# Patient Record
Sex: Female | Born: 1975 | Race: Black or African American | Hispanic: No | Marital: Single | Smoking: Current every day smoker
Health system: Southern US, Community
[De-identification: ages and names within clinical notes are randomized; demographics above are authoritative.]

## PROBLEM LIST (undated history)

## (undated) DIAGNOSIS — K219 Gastro-esophageal reflux disease without esophagitis: Secondary | ICD-10-CM

## (undated) DIAGNOSIS — D649 Anemia, unspecified: Secondary | ICD-10-CM

## (undated) DIAGNOSIS — K269 Duodenal ulcer, unspecified as acute or chronic, without hemorrhage or perforation: Secondary | ICD-10-CM

## (undated) DIAGNOSIS — E669 Obesity, unspecified: Secondary | ICD-10-CM

## (undated) DIAGNOSIS — K209 Esophagitis, unspecified: Secondary | ICD-10-CM

## (undated) DIAGNOSIS — F329 Major depressive disorder, single episode, unspecified: Secondary | ICD-10-CM

## (undated) DIAGNOSIS — R109 Unspecified abdominal pain: Secondary | ICD-10-CM

## (undated) DIAGNOSIS — F32A Depression, unspecified: Secondary | ICD-10-CM

## (undated) DIAGNOSIS — I10 Essential (primary) hypertension: Secondary | ICD-10-CM

## (undated) DIAGNOSIS — F1910 Other psychoactive substance abuse, uncomplicated: Secondary | ICD-10-CM

## (undated) DIAGNOSIS — G8929 Other chronic pain: Secondary | ICD-10-CM

## (undated) HISTORY — PX: TUBAL LIGATION: SHX77

## (undated) HISTORY — PX: TONSILLECTOMY: SUR1361

---

## 1997-04-13 ENCOUNTER — Inpatient Hospital Stay (HOSPITAL_COMMUNITY): Admission: AD | Admit: 1997-04-13 | Discharge: 1997-04-13 | Payer: Self-pay | Admitting: *Deleted

## 1997-07-01 ENCOUNTER — Inpatient Hospital Stay (HOSPITAL_COMMUNITY): Admission: AD | Admit: 1997-07-01 | Discharge: 1997-07-01 | Payer: Self-pay | Admitting: *Deleted

## 1997-07-05 ENCOUNTER — Ambulatory Visit (HOSPITAL_COMMUNITY): Admission: RE | Admit: 1997-07-05 | Discharge: 1997-07-05 | Payer: Self-pay | Admitting: *Deleted

## 1997-07-21 ENCOUNTER — Inpatient Hospital Stay (HOSPITAL_COMMUNITY): Admission: AD | Admit: 1997-07-21 | Discharge: 1997-07-21 | Payer: Self-pay | Admitting: *Deleted

## 1997-08-11 ENCOUNTER — Inpatient Hospital Stay (HOSPITAL_COMMUNITY): Admission: AD | Admit: 1997-08-11 | Discharge: 1997-08-13 | Payer: Self-pay | Admitting: *Deleted

## 1998-02-21 ENCOUNTER — Emergency Department (HOSPITAL_COMMUNITY): Admission: EM | Admit: 1998-02-21 | Discharge: 1998-02-21 | Payer: Self-pay | Admitting: Emergency Medicine

## 1998-04-05 ENCOUNTER — Encounter: Payer: Self-pay | Admitting: Emergency Medicine

## 1998-04-05 ENCOUNTER — Emergency Department (HOSPITAL_COMMUNITY): Admission: EM | Admit: 1998-04-05 | Discharge: 1998-04-05 | Payer: Self-pay | Admitting: Emergency Medicine

## 1998-10-12 ENCOUNTER — Emergency Department (HOSPITAL_COMMUNITY): Admission: EM | Admit: 1998-10-12 | Discharge: 1998-10-12 | Payer: Self-pay | Admitting: *Deleted

## 1999-01-12 ENCOUNTER — Emergency Department (HOSPITAL_COMMUNITY): Admission: EM | Admit: 1999-01-12 | Discharge: 1999-01-12 | Payer: Self-pay | Admitting: Emergency Medicine

## 1999-04-03 ENCOUNTER — Emergency Department (HOSPITAL_COMMUNITY): Admission: EM | Admit: 1999-04-03 | Discharge: 1999-04-03 | Payer: Self-pay | Admitting: Internal Medicine

## 2002-03-11 HISTORY — PX: CHOLECYSTECTOMY: SHX55

## 2002-11-25 ENCOUNTER — Encounter: Payer: Self-pay | Admitting: Emergency Medicine

## 2002-11-25 ENCOUNTER — Inpatient Hospital Stay (HOSPITAL_COMMUNITY): Admission: EM | Admit: 2002-11-25 | Discharge: 2002-11-26 | Payer: Self-pay | Admitting: Emergency Medicine

## 2002-11-25 ENCOUNTER — Encounter: Payer: Self-pay | Admitting: Surgery

## 2002-11-25 ENCOUNTER — Encounter (INDEPENDENT_AMBULATORY_CARE_PROVIDER_SITE_OTHER): Payer: Self-pay | Admitting: Specialist

## 2003-01-27 ENCOUNTER — Inpatient Hospital Stay (HOSPITAL_COMMUNITY): Admission: AD | Admit: 2003-01-27 | Discharge: 2003-01-27 | Payer: Self-pay | Admitting: Obstetrics & Gynecology

## 2003-07-21 ENCOUNTER — Emergency Department (HOSPITAL_COMMUNITY): Admission: EM | Admit: 2003-07-21 | Discharge: 2003-07-21 | Payer: Self-pay | Admitting: Emergency Medicine

## 2004-03-14 ENCOUNTER — Emergency Department (HOSPITAL_COMMUNITY): Admission: EM | Admit: 2004-03-14 | Discharge: 2004-03-14 | Payer: Self-pay | Admitting: Emergency Medicine

## 2004-11-29 ENCOUNTER — Emergency Department (HOSPITAL_COMMUNITY): Admission: EM | Admit: 2004-11-29 | Discharge: 2004-11-29 | Payer: Self-pay | Admitting: *Deleted

## 2004-12-21 ENCOUNTER — Emergency Department (HOSPITAL_COMMUNITY): Admission: EM | Admit: 2004-12-21 | Discharge: 2004-12-21 | Payer: Self-pay | Admitting: Emergency Medicine

## 2006-03-11 DIAGNOSIS — G8929 Other chronic pain: Secondary | ICD-10-CM

## 2006-03-11 HISTORY — DX: Other chronic pain: G89.29

## 2006-05-02 ENCOUNTER — Emergency Department (HOSPITAL_COMMUNITY): Admission: EM | Admit: 2006-05-02 | Discharge: 2006-05-02 | Payer: Self-pay | Admitting: Emergency Medicine

## 2006-05-15 ENCOUNTER — Emergency Department (HOSPITAL_COMMUNITY): Admission: EM | Admit: 2006-05-15 | Discharge: 2006-05-15 | Payer: Self-pay | Admitting: Emergency Medicine

## 2006-06-04 ENCOUNTER — Emergency Department (HOSPITAL_COMMUNITY): Admission: EM | Admit: 2006-06-04 | Discharge: 2006-06-04 | Payer: Self-pay | Admitting: Emergency Medicine

## 2006-09-08 ENCOUNTER — Emergency Department (HOSPITAL_COMMUNITY): Admission: EM | Admit: 2006-09-08 | Discharge: 2006-09-08 | Payer: Self-pay | Admitting: Emergency Medicine

## 2006-09-21 ENCOUNTER — Emergency Department (HOSPITAL_COMMUNITY): Admission: EM | Admit: 2006-09-21 | Discharge: 2006-09-21 | Payer: Self-pay | Admitting: Emergency Medicine

## 2006-11-10 HISTORY — PX: LAPAROSCOPIC APPENDECTOMY: SUR753

## 2006-12-01 ENCOUNTER — Encounter (INDEPENDENT_AMBULATORY_CARE_PROVIDER_SITE_OTHER): Payer: Self-pay | Admitting: General Surgery

## 2006-12-01 ENCOUNTER — Inpatient Hospital Stay (HOSPITAL_COMMUNITY): Admission: EM | Admit: 2006-12-01 | Discharge: 2006-12-03 | Payer: Self-pay | Admitting: Emergency Medicine

## 2007-04-18 ENCOUNTER — Emergency Department (HOSPITAL_COMMUNITY): Admission: EM | Admit: 2007-04-18 | Discharge: 2007-04-18 | Payer: Self-pay | Admitting: Emergency Medicine

## 2007-05-24 ENCOUNTER — Emergency Department (HOSPITAL_COMMUNITY): Admission: EM | Admit: 2007-05-24 | Discharge: 2007-05-25 | Payer: Self-pay | Admitting: Emergency Medicine

## 2007-07-24 ENCOUNTER — Emergency Department (HOSPITAL_COMMUNITY): Admission: EM | Admit: 2007-07-24 | Discharge: 2007-07-24 | Payer: Self-pay | Admitting: Emergency Medicine

## 2007-09-16 ENCOUNTER — Emergency Department (HOSPITAL_COMMUNITY): Admission: EM | Admit: 2007-09-16 | Discharge: 2007-09-16 | Payer: Self-pay | Admitting: Emergency Medicine

## 2007-10-23 ENCOUNTER — Emergency Department (HOSPITAL_COMMUNITY): Admission: EM | Admit: 2007-10-23 | Discharge: 2007-10-23 | Payer: Self-pay | Admitting: Emergency Medicine

## 2007-11-15 ENCOUNTER — Inpatient Hospital Stay (HOSPITAL_COMMUNITY): Admission: AD | Admit: 2007-11-15 | Discharge: 2007-11-15 | Payer: Self-pay | Admitting: Obstetrics & Gynecology

## 2007-12-06 ENCOUNTER — Emergency Department (HOSPITAL_COMMUNITY): Admission: EM | Admit: 2007-12-06 | Discharge: 2007-12-06 | Payer: Self-pay | Admitting: Emergency Medicine

## 2007-12-09 ENCOUNTER — Emergency Department (HOSPITAL_COMMUNITY): Admission: EM | Admit: 2007-12-09 | Discharge: 2007-12-09 | Payer: Self-pay | Admitting: Emergency Medicine

## 2008-02-09 ENCOUNTER — Emergency Department (HOSPITAL_COMMUNITY): Admission: EM | Admit: 2008-02-09 | Discharge: 2008-02-09 | Payer: Self-pay | Admitting: Emergency Medicine

## 2008-03-21 ENCOUNTER — Emergency Department (HOSPITAL_COMMUNITY): Admission: EM | Admit: 2008-03-21 | Discharge: 2008-03-21 | Payer: Self-pay | Admitting: Emergency Medicine

## 2008-04-22 ENCOUNTER — Emergency Department (HOSPITAL_COMMUNITY): Admission: EM | Admit: 2008-04-22 | Discharge: 2008-04-22 | Payer: Self-pay | Admitting: Emergency Medicine

## 2008-07-07 ENCOUNTER — Emergency Department (HOSPITAL_COMMUNITY): Admission: EM | Admit: 2008-07-07 | Discharge: 2008-07-07 | Payer: Self-pay | Admitting: Emergency Medicine

## 2008-09-22 ENCOUNTER — Emergency Department (HOSPITAL_COMMUNITY): Admission: EM | Admit: 2008-09-22 | Discharge: 2008-09-23 | Payer: Self-pay | Admitting: Emergency Medicine

## 2009-02-03 ENCOUNTER — Emergency Department (HOSPITAL_COMMUNITY): Admission: EM | Admit: 2009-02-03 | Discharge: 2009-02-04 | Payer: Self-pay | Admitting: Emergency Medicine

## 2009-02-16 ENCOUNTER — Emergency Department (HOSPITAL_COMMUNITY): Admission: EM | Admit: 2009-02-16 | Discharge: 2009-02-16 | Payer: Self-pay | Admitting: Emergency Medicine

## 2009-03-11 DIAGNOSIS — K209 Esophagitis, unspecified without bleeding: Secondary | ICD-10-CM

## 2009-03-11 DIAGNOSIS — E669 Obesity, unspecified: Secondary | ICD-10-CM

## 2009-03-11 HISTORY — DX: Obesity, unspecified: E66.9

## 2009-03-11 HISTORY — DX: Esophagitis, unspecified without bleeding: K20.90

## 2009-03-17 ENCOUNTER — Emergency Department (HOSPITAL_COMMUNITY): Admission: EM | Admit: 2009-03-17 | Discharge: 2009-03-17 | Payer: Self-pay | Admitting: Emergency Medicine

## 2009-03-17 ENCOUNTER — Encounter (INDEPENDENT_AMBULATORY_CARE_PROVIDER_SITE_OTHER): Payer: Self-pay | Admitting: *Deleted

## 2009-03-24 ENCOUNTER — Telehealth: Payer: Self-pay | Admitting: Gastroenterology

## 2009-08-07 ENCOUNTER — Emergency Department (HOSPITAL_COMMUNITY): Admission: EM | Admit: 2009-08-07 | Discharge: 2009-08-07 | Payer: Self-pay | Admitting: Emergency Medicine

## 2009-10-11 ENCOUNTER — Emergency Department (HOSPITAL_COMMUNITY): Admission: EM | Admit: 2009-10-11 | Discharge: 2009-10-11 | Payer: Self-pay | Admitting: Emergency Medicine

## 2009-11-09 ENCOUNTER — Emergency Department (HOSPITAL_COMMUNITY): Admission: EM | Admit: 2009-11-09 | Discharge: 2009-11-09 | Payer: Self-pay | Admitting: Emergency Medicine

## 2009-12-29 ENCOUNTER — Emergency Department (HOSPITAL_COMMUNITY): Admission: EM | Admit: 2009-12-29 | Discharge: 2009-12-29 | Payer: Self-pay | Admitting: Emergency Medicine

## 2009-12-30 ENCOUNTER — Emergency Department (HOSPITAL_COMMUNITY): Admission: EM | Admit: 2009-12-30 | Discharge: 2009-12-30 | Payer: Self-pay | Admitting: Emergency Medicine

## 2010-02-15 ENCOUNTER — Inpatient Hospital Stay (HOSPITAL_COMMUNITY): Admission: EM | Admit: 2010-02-15 | Discharge: 2009-03-27 | Payer: Self-pay | Admitting: Emergency Medicine

## 2010-04-10 NOTE — Progress Notes (Signed)
Summary: triage  Phone Note Call from Patient Call back at [phone]   Caller: Patient Call For: Dr. Christella Hartigan Reason for Call: Talk to Nurse Summary of Call: pt reporting abd pain is worsening and would like to be seen before sch'ed appt on the 28th Initial call taken by: Vallarie Mare,  March 24, 2009 10:59 AM  Follow-up for Phone Call        [phone]  this number was given to me by the pts mother. The number that was left for me to call is hers.  I called the number above given to me by her mother but got voicemail.  left message on machine to call back Chales Abrahams CMA (AAMA)  March 24, 2009 11:19 AM   Pt called and states she has been vomiting since she left the ER and has chronic abd pain and is getting weak.  I offered appt with Amy Esterwood on Monday and advised her to go to the ER.  She agreed to the appt and is going to the ER and will keep appt with Amy on Monday.  Appt with Dr Christella Hartigan was cx. Follow-up by: Chales Abrahams CMA Duncan Dull),  March 24, 2009 11:43 AM

## 2010-04-10 NOTE — Letter (Signed)
Summary: New Patient letter  [facility]  [address] [place], [place] [address]   Phone: [phone]  Fax: [phone]       03/17/2009 MRN: [id number]  [facility] [address] [place], [place]  [address]  Dear [name],  Welcome to the Gastroenterology Division at [facility]    You are scheduled to see Dr.  Rob Bunting on April 07, 2009 at 3:00pm on the 3rd floor at [facility], [address]  We ask that you try to arrive at our office 15 minutes prior to your appointment time to allow for check-in.  We would like you to complete the enclosed self-administered evaluation form prior to your visit and bring it with you on the day of your appointment.  We will review it with you.  Also, please bring a complete list of all your medications or, if you prefer, bring the medication bottles and we will list them.  Please bring your insurance card so that we may make a copy of it.  If your insurance requires a referral to see a specialist, please bring your referral form from your primary care physician.  Co-payments are due at the time of your visit and may be paid by cash, check or credit card.     Your office visit will consist of a consult with your physician (includes a physical exam), any laboratory testing he/she may order, scheduling of any necessary diagnostic testing (e.g. x-ray, ultrasound, CT-scan), and scheduling of a procedure (e.g. Endoscopy, Colonoscopy) if required.  Please allow enough time on your schedule to allow for any/all of these possibilities.    If you cannot keep your appointment, please call [phone] to cancel or reschedule prior to your appointment date.  This allows [place] the opportunity to schedule an appointment for another patient in need of care.  If you do not cancel or reschedule by 5 p.m. the business day prior to your appointment date, you will be charged a $50.00 late cancellation/no-show fee.     Thank you for choosing [facility] for your medical needs.  We appreciate the opportunity to care for you.  Please visit [place] at our website  to learn more about our practice.                     Sincerely,                                                             The Gastroenterology Division

## 2010-05-23 LAB — URINALYSIS, ROUTINE W REFLEX MICROSCOPIC
Bilirubin Urine: NEGATIVE
Ketones, ur: NEGATIVE mg/dL
Nitrite: NEGATIVE
Specific Gravity, Urine: 1.007 (ref 1.005–1.030)
Urobilinogen, UA: 0.2 mg/dL (ref 0.0–1.0)
pH: 7.5 (ref 5.0–8.0)

## 2010-05-23 LAB — CBC
HCT: 38.3 % (ref 36.0–46.0)
Hemoglobin: 12.8 g/dL (ref 12.0–15.0)
Hemoglobin: 12.9 g/dL (ref 12.0–15.0)
MCH: 31.3 pg (ref 26.0–34.0)
MCH: 31.4 pg (ref 26.0–34.0)
MCHC: 33.6 g/dL (ref 30.0–36.0)
MCHC: 34 g/dL (ref 30.0–36.0)
MCV: 93.1 fL (ref 78.0–100.0)
Platelets: 394 10*3/uL (ref 150–400)
RBC: 4.08 MIL/uL (ref 3.87–5.11)
RBC: 4.12 MIL/uL (ref 3.87–5.11)

## 2010-05-23 LAB — COMPREHENSIVE METABOLIC PANEL
ALT: 20 U/L (ref 0–35)
AST: 23 U/L (ref 0–37)
Albumin: 3.7 g/dL (ref 3.5–5.2)
BUN: 8 mg/dL (ref 6–23)
CO2: 25 mEq/L (ref 19–32)
CO2: 27 mEq/L (ref 19–32)
Calcium: 8.9 mg/dL (ref 8.4–10.5)
Calcium: 9.2 mg/dL (ref 8.4–10.5)
Chloride: 106 mEq/L (ref 96–112)
Creatinine, Ser: 0.63 mg/dL (ref 0.4–1.2)
Creatinine, Ser: 0.71 mg/dL (ref 0.4–1.2)
GFR calc Af Amer: >60 mL/min (ref >60)
GFR calc non Af Amer: >60 mL/min (ref >60)
Glucose, Bld: 89 mg/dL (ref 70–99)
Sodium: 136 mEq/L (ref 135–145)
Total Bilirubin: 0.5 mg/dL (ref 0.3–1.2)

## 2010-05-23 LAB — DIFFERENTIAL
Basophils Absolute: 0.1 10*3/uL (ref 0.0–0.1)
Eosinophils Absolute: 0.1 10*3/uL (ref 0.0–0.7)
Eosinophils Relative: 1 % (ref 0–5)
Lymphocytes Relative: 18 % (ref 12–46)
Lymphocytes Relative: 20 % (ref 12–46)
Lymphs Abs: 2 10*3/uL (ref 0.7–4.0)
Lymphs Abs: 2 10*3/uL (ref 0.7–4.0)
Monocytes Absolute: 0.2 10*3/uL (ref 0.1–1.0)
Monocytes Relative: 2 % — ABNORMAL LOW (ref 3–12)
Neutrophils Relative %: 75 % (ref 43–77)

## 2010-05-23 LAB — LIPASE, BLOOD
Lipase: 22 U/L (ref 11–59)
Lipase: 26 U/L (ref 11–59)

## 2010-05-24 LAB — POCT I-STAT, CHEM 8
BUN: 14 mg/dL (ref 6–23)
Calcium, Ion: 1.19 mmol/L (ref 1.12–1.32)
Chloride: 101 mEq/L (ref 96–112)
Creatinine, Ser: 0.6 mg/dL (ref 0.4–1.2)
Sodium: 137 mEq/L (ref 135–145)

## 2010-05-24 LAB — POCT PREGNANCY, URINE: Preg Test, Ur: NEGATIVE

## 2010-05-24 LAB — CBC
Hemoglobin: 13 g/dL (ref 12.0–15.0)
MCV: 90.2 fL (ref 78.0–100.0)
Platelets: 364 10*3/uL (ref 150–400)
RBC: 4.09 MIL/uL (ref 3.87–5.11)
WBC: 13.5 10*3/uL — ABNORMAL HIGH (ref 4.0–10.5)

## 2010-05-24 LAB — MONONUCLEOSIS SCREEN: Mono Screen: NEGATIVE

## 2010-05-24 LAB — URINALYSIS, ROUTINE W REFLEX MICROSCOPIC
Bilirubin Urine: NEGATIVE
Glucose, UA: NEGATIVE mg/dL
Hgb urine dipstick: NEGATIVE
Specific Gravity, Urine: 1.016 (ref 1.005–1.030)
Urobilinogen, UA: 1 mg/dL (ref 0.0–1.0)
pH: 6 (ref 5.0–8.0)

## 2010-05-24 LAB — DIFFERENTIAL
Eosinophils Relative: 1 % (ref 0–5)
Lymphocytes Relative: 25 % (ref 12–46)
Lymphs Abs: 3.4 10*3/uL (ref 0.7–4.0)
Monocytes Relative: 6 % (ref 3–12)

## 2010-05-24 LAB — POCT CARDIAC MARKERS
CKMB, poc: 1.3 ng/mL (ref 1.0–8.0)
Troponin i, poc: <0.05 ng/mL (ref 0.00–0.09)

## 2010-05-25 LAB — RAPID URINE DRUG SCREEN, HOSP PERFORMED
Amphetamines: POSITIVE — AB
Barbiturates: NOT DETECTED
Benzodiazepines: NOT DETECTED
Cocaine: NOT DETECTED
Opiates: NOT DETECTED
Tetrahydrocannabinol: NOT DETECTED

## 2010-05-27 LAB — DIFFERENTIAL
Basophils Absolute: 0.1 10*3/uL (ref 0.0–0.1)
Basophils Absolute: 0.1 10*3/uL (ref 0.0–0.1)
Basophils Relative: 1 % (ref 0–1)
Eosinophils Absolute: 0 10*3/uL (ref 0.0–0.7)
Lymphocytes Relative: 27 % (ref 12–46)
Lymphs Abs: 3.6 10*3/uL (ref 0.7–4.0)
Monocytes Absolute: 0.5 10*3/uL (ref 0.1–1.0)
Monocytes Relative: 5 % (ref 3–12)
Neutro Abs: 7.7 10*3/uL (ref 1.7–7.7)
Neutro Abs: 8.7 10*3/uL — ABNORMAL HIGH (ref 1.7–7.7)
Neutrophils Relative %: 66 % (ref 43–77)
Neutrophils Relative %: 70 % (ref 43–77)

## 2010-05-27 LAB — URINALYSIS, ROUTINE W REFLEX MICROSCOPIC
Glucose, UA: NEGATIVE mg/dL
Nitrite: NEGATIVE
Protein, ur: 30 mg/dL — AB
Specific Gravity, Urine: 1.04 — ABNORMAL HIGH (ref 1.005–1.030)
Urobilinogen, UA: 1 mg/dL (ref 0.0–1.0)
pH: 6.5 (ref 5.0–8.0)
pH: 7 (ref 5.0–8.0)

## 2010-05-27 LAB — COMPREHENSIVE METABOLIC PANEL
ALT: 29 U/L (ref 0–35)
AST: 22 U/L (ref 0–37)
Alkaline Phosphatase: 62 U/L (ref 39–117)
BUN: 7 mg/dL (ref 6–23)
BUN: 9 mg/dL (ref 6–23)
CO2: 23 mEq/L (ref 19–32)
Calcium: 8.9 mg/dL (ref 8.4–10.5)
Chloride: 103 mEq/L (ref 96–112)
Creatinine, Ser: 0.61 mg/dL (ref 0.4–1.2)
GFR calc Af Amer: >60 mL/min (ref >60)
GFR calc non Af Amer: >60 mL/min (ref >60)
Glucose, Bld: 93 mg/dL (ref 70–99)
Potassium: 4.1 mEq/L (ref 3.5–5.1)
Sodium: 137 mEq/L (ref 135–145)
Total Bilirubin: 0.5 mg/dL (ref 0.3–1.2)
Total Bilirubin: 0.6 mg/dL (ref 0.3–1.2)
Total Protein: 8 g/dL (ref 6.0–8.3)

## 2010-05-27 LAB — CBC
HCT: 34.7 % — ABNORMAL LOW (ref 36.0–46.0)
HCT: 37.7 % (ref 36.0–46.0)
HCT: 41 % (ref 36.0–46.0)
Hemoglobin: 11.4 g/dL — ABNORMAL LOW (ref 12.0–15.0)
Hemoglobin: 13.7 g/dL (ref 12.0–15.0)
MCHC: 33.4 g/dL (ref 30.0–36.0)
MCV: 93.6 fL (ref 78.0–100.0)
MCV: 94.1 fL (ref 78.0–100.0)
Platelets: 334 10*3/uL (ref 150–400)
RBC: 3.68 MIL/uL — ABNORMAL LOW (ref 3.87–5.11)
RBC: 4.03 MIL/uL (ref 3.87–5.11)
RBC: 4.39 MIL/uL (ref 3.87–5.11)
WBC: 10.4 10*3/uL (ref 4.0–10.5)
WBC: 11.1 10*3/uL — ABNORMAL HIGH (ref 4.0–10.5)

## 2010-05-27 LAB — BASIC METABOLIC PANEL
Chloride: 104 mEq/L (ref 96–112)
GFR calc Af Amer: >60 mL/min (ref >60)
GFR calc non Af Amer: >60 mL/min (ref >60)
Potassium: 3.3 mEq/L — ABNORMAL LOW (ref 3.5–5.1)
Sodium: 137 mEq/L (ref 135–145)

## 2010-05-27 LAB — LIPASE, BLOOD: Lipase: 20 U/L (ref 11–59)

## 2010-05-27 LAB — URINE CULTURE

## 2010-05-27 LAB — RAPID URINE DRUG SCREEN, HOSP PERFORMED
Amphetamines: NOT DETECTED
Tetrahydrocannabinol: NOT DETECTED

## 2010-05-27 LAB — URINE MICROSCOPIC-ADD ON

## 2010-05-27 LAB — PREGNANCY, URINE: Preg Test, Ur: NEGATIVE

## 2010-05-28 LAB — CBC
HCT: 36.4 % (ref 36.0–46.0)
MCV: 94.3 fL (ref 78.0–100.0)
Platelets: 353 10*3/uL (ref 150–400)
RDW: 14.4 % (ref 11.5–15.5)

## 2010-05-28 LAB — DIFFERENTIAL
Lymphocytes Relative: 30 % (ref 12–46)
Monocytes Absolute: 0.4 10*3/uL (ref 0.1–1.0)
Monocytes Relative: 4 % (ref 3–12)
Neutro Abs: 6 10*3/uL (ref 1.7–7.7)

## 2010-05-28 LAB — URINALYSIS, ROUTINE W REFLEX MICROSCOPIC
Ketones, ur: NEGATIVE mg/dL
Nitrite: NEGATIVE
pH: 6.5 (ref 5.0–8.0)

## 2010-05-28 LAB — URINE CULTURE: Culture: NO GROWTH

## 2010-05-28 LAB — COMPREHENSIVE METABOLIC PANEL
Albumin: 3.8 g/dL (ref 3.5–5.2)
BUN: 6 mg/dL (ref 6–23)
Creatinine, Ser: 0.59 mg/dL (ref 0.4–1.2)
GFR calc Af Amer: >60 mL/min (ref >60)
Potassium: 3.8 mEq/L (ref 3.5–5.1)
Total Protein: 7.1 g/dL (ref 6.0–8.3)

## 2010-05-28 LAB — LIPASE, BLOOD: Lipase: 28 U/L (ref 11–59)

## 2010-05-28 LAB — MAGNESIUM: Magnesium: 1.9 mg/dL (ref 1.5–2.5)

## 2010-05-28 LAB — URINE MICROSCOPIC-ADD ON

## 2010-05-28 LAB — POCT CARDIAC MARKERS: Myoglobin, poc: 36.6 ng/mL (ref 12–200)

## 2010-06-12 LAB — URINE MICROSCOPIC-ADD ON

## 2010-06-12 LAB — URINALYSIS, ROUTINE W REFLEX MICROSCOPIC
Glucose, UA: NEGATIVE mg/dL
pH: 6.5 (ref 5.0–8.0)

## 2010-06-12 LAB — WET PREP, GENITAL

## 2010-06-12 LAB — GC/CHLAMYDIA PROBE AMP, GENITAL: Chlamydia, DNA Probe: NEGATIVE

## 2010-06-13 LAB — COMPREHENSIVE METABOLIC PANEL
ALT: 32 U/L (ref 0–35)
Albumin: 3.9 g/dL (ref 3.5–5.2)
Alkaline Phosphatase: 65 U/L (ref 39–117)
BUN: 7 mg/dL (ref 6–23)
Chloride: 102 mEq/L (ref 96–112)
Potassium: 3.5 mEq/L (ref 3.5–5.1)
Total Bilirubin: 0.6 mg/dL (ref 0.3–1.2)

## 2010-06-13 LAB — URINALYSIS, ROUTINE W REFLEX MICROSCOPIC
Nitrite: NEGATIVE
Specific Gravity, Urine: 1.035 — ABNORMAL HIGH (ref 1.005–1.030)
pH: 6.5 (ref 5.0–8.0)

## 2010-06-13 LAB — CBC
HCT: 37.5 % (ref 36.0–46.0)
Hemoglobin: 12.5 g/dL (ref 12.0–15.0)
Platelets: 400 10*3/uL (ref 150–400)
WBC: 11.3 10*3/uL — ABNORMAL HIGH (ref 4.0–10.5)

## 2010-06-13 LAB — DIFFERENTIAL
Basophils Absolute: 0.2 10*3/uL — ABNORMAL HIGH (ref 0.0–0.1)
Basophils Relative: 2 % — ABNORMAL HIGH (ref 0–1)
Eosinophils Absolute: 0.1 10*3/uL (ref 0.0–0.7)
Eosinophils Relative: 1 % (ref 0–5)
Monocytes Absolute: 0.4 10*3/uL (ref 0.1–1.0)
Neutro Abs: 7.1 10*3/uL (ref 1.7–7.7)

## 2010-06-13 LAB — URINE MICROSCOPIC-ADD ON

## 2010-06-13 LAB — URINE CULTURE

## 2010-06-18 LAB — BASIC METABOLIC PANEL
BUN: 7 mg/dL (ref 6–23)
CO2: 25 mEq/L (ref 19–32)
Calcium: 8.7 mg/dL (ref 8.4–10.5)
Creatinine, Ser: 0.57 mg/dL (ref 0.4–1.2)
GFR calc Af Amer: >60 mL/min (ref >60)
Glucose, Bld: 78 mg/dL (ref 70–99)

## 2010-06-18 LAB — DIFFERENTIAL
Basophils Absolute: 0.5 10*3/uL — ABNORMAL HIGH (ref 0.0–0.1)
Eosinophils Absolute: 0.1 10*3/uL (ref 0.0–0.7)
Lymphocytes Relative: 21 % (ref 12–46)
Monocytes Absolute: 0.3 10*3/uL (ref 0.1–1.0)
Neutrophils Relative %: 72 % (ref 43–77)

## 2010-06-18 LAB — CBC
MCHC: 34.3 g/dL (ref 30.0–36.0)
MCV: 93.5 fL (ref 78.0–100.0)
RDW: 13.8 % (ref 11.5–15.5)

## 2010-06-26 LAB — POCT CARDIAC MARKERS
CKMB, poc: <1 ng/mL — ABNORMAL LOW (ref 1.0–8.0)
CKMB, poc: <1 ng/mL — ABNORMAL LOW (ref 1.0–8.0)
Myoglobin, poc: 39.5 ng/mL (ref 12–200)
Myoglobin, poc: 43.4 ng/mL (ref 12–200)

## 2010-06-26 LAB — DIFFERENTIAL
Basophils Relative: 1 % (ref 0–1)
Eosinophils Absolute: 0.1 10*3/uL (ref 0.0–0.7)
Eosinophils Relative: 1 % (ref 0–5)
Lymphs Abs: 2.4 10*3/uL (ref 0.7–4.0)
Neutrophils Relative %: 72 % (ref 43–77)

## 2010-06-26 LAB — POCT I-STAT, CHEM 8
BUN: 5 mg/dL — ABNORMAL LOW (ref 6–23)
Creatinine, Ser: 0.5 mg/dL (ref 0.4–1.2)
Glucose, Bld: 89 mg/dL (ref 70–99)
Hemoglobin: 12.9 g/dL (ref 12.0–15.0)
Potassium: 3.9 mEq/L (ref 3.5–5.1)
Sodium: 139 mEq/L (ref 135–145)

## 2010-06-26 LAB — URINE CULTURE: Colony Count: 40000

## 2010-06-26 LAB — CBC
HCT: 36 % (ref 36.0–46.0)
Hemoglobin: 12 g/dL (ref 12.0–15.0)
MCHC: 33.5 g/dL (ref 30.0–36.0)
MCV: 93.4 fL (ref 78.0–100.0)
Platelets: 378 10*3/uL (ref 150–400)
RBC: 3.85 MIL/uL — ABNORMAL LOW (ref 3.87–5.11)
RDW: 14.2 % (ref 11.5–15.5)
WBC: 11.2 10*3/uL — ABNORMAL HIGH (ref 4.0–10.5)

## 2010-06-26 LAB — D-DIMER, QUANTITATIVE: D-Dimer, Quant: 0.39 ug/mL-FEU (ref 0.00–0.48)

## 2010-06-26 LAB — URINALYSIS, ROUTINE W REFLEX MICROSCOPIC
Bilirubin Urine: NEGATIVE
Glucose, UA: NEGATIVE mg/dL
Ketones, ur: NEGATIVE mg/dL
Nitrite: NEGATIVE
Protein, ur: NEGATIVE mg/dL
pH: 6.5 (ref 5.0–8.0)

## 2010-06-26 LAB — POCT PREGNANCY, URINE: Preg Test, Ur: NEGATIVE

## 2010-07-05 ENCOUNTER — Emergency Department (HOSPITAL_COMMUNITY)
Admission: EM | Admit: 2010-07-05 | Discharge: 2010-07-05 | Disposition: A | Discharge status: Home / Self Care | Payer: Self-pay | Attending: Emergency Medicine | Admitting: Emergency Medicine

## 2010-07-05 ENCOUNTER — Emergency Department (HOSPITAL_COMMUNITY): Payer: Self-pay

## 2010-07-05 DIAGNOSIS — K589 Irritable bowel syndrome without diarrhea: Secondary | ICD-10-CM | POA: Insufficient documentation

## 2010-07-05 DIAGNOSIS — R109 Unspecified abdominal pain: Secondary | ICD-10-CM | POA: Insufficient documentation

## 2010-07-05 DIAGNOSIS — R112 Nausea with vomiting, unspecified: Secondary | ICD-10-CM | POA: Insufficient documentation

## 2010-07-05 DIAGNOSIS — K219 Gastro-esophageal reflux disease without esophagitis: Secondary | ICD-10-CM | POA: Insufficient documentation

## 2010-07-05 DIAGNOSIS — I10 Essential (primary) hypertension: Secondary | ICD-10-CM | POA: Insufficient documentation

## 2010-07-05 LAB — DIFFERENTIAL
Basophils Absolute: 0 10*3/uL (ref 0.0–0.1)
Basophils Relative: 0 % (ref 0–1)
Eosinophils Absolute: 0.1 10*3/uL (ref 0.0–0.7)
Eosinophils Relative: 1 % (ref 0–5)
Monocytes Absolute: 1 10*3/uL (ref 0.1–1.0)
Monocytes Relative: 7 % (ref 3–12)

## 2010-07-05 LAB — COMPREHENSIVE METABOLIC PANEL
AST: 18 U/L (ref 0–37)
Albumin: 4.2 g/dL (ref 3.5–5.2)
CO2: 25 mEq/L (ref 19–32)
Calcium: 9.6 mg/dL (ref 8.4–10.5)
Creatinine, Ser: 0.79 mg/dL (ref 0.4–1.2)
GFR calc Af Amer: >60 mL/min (ref >60)
GFR calc non Af Amer: >60 mL/min (ref >60)
Sodium: 136 mEq/L (ref 135–145)
Total Protein: 8.1 g/dL (ref 6.0–8.3)

## 2010-07-05 LAB — URINALYSIS, ROUTINE W REFLEX MICROSCOPIC
Glucose, UA: NEGATIVE mg/dL
Protein, ur: NEGATIVE mg/dL
Specific Gravity, Urine: 1.013 (ref 1.005–1.030)
Urobilinogen, UA: 0.2 mg/dL (ref 0.0–1.0)

## 2010-07-05 LAB — URINE MICROSCOPIC-ADD ON

## 2010-07-05 LAB — CBC
MCH: 30.5 pg (ref 26.0–34.0)
MCHC: 34.4 g/dL (ref 30.0–36.0)
Platelets: 400 10*3/uL (ref 150–400)
RDW: 13.8 % (ref 11.5–15.5)

## 2010-07-24 NOTE — H&P (Signed)
[name], [name]             ACCOUNT NO.:  [id number]   MEDICAL RECORD NO.:  [id number]          PATIENT TYPE:  INP   LOCATION:  1610                         FACILITY:  [facility]   PHYSICIAN:  Adolph Pollack, M.D.DATE OF BIRTH:  [birth year]   DATE OF ADMISSION:  11/30/2006  DATE OF DISCHARGE:                              HISTORY & PHYSICAL   CHIEF COMPLAINT:  Lower abdominal pain.   HISTORY OF PRESENT ILLNESS:  This is a 35 year old female who got off  work yesterday and then began complaining of severe lower abdominal  pain.  Pain radiated up to her chest.  The pain was associated with  nausea and vomiting.  No fever and no diarrhea.  The pain persisted, and  she came to the emergency room at approximately 7:15 p.m. on November 30, 2006 for evaluation.  She is noted to have right lower quadrant  tenderness.  White cell count was elevated to 22,200 with a left shift,  and urinalysis was unremarkable.  She has had a previous  cholecystectomy.  She underwent a CT scan of the abdomen which  demonstrated mild dilatation of the appendix with some inflammatory  changes and a small amount of free fluid consistent with early acute  appendicitis.  It was at this point that I was asked to see her.   PAST MEDICAL HISTORY:  1. Hypertension.  2. Asthma.  3. Pyelonephritis  4. Gallbladder disease.   PAST SURGICAL HISTORY:  1. Tonsillectomy.  2. Laparoscopic tubal ligation.  3. Cesarean section.  4. Laparoscopic cholecystectomy.   ALLERGIES:  HYDROCODONE.   MEDICATIONS:  Albuterol inhaler p.r.n.   SOCIAL HISTORY:  She smokes cigarettes.  No alcohol use.  She works for  [employer].  She has no primary care physician.   FAMILY HISTORY:  Positive for hypertension.   REVIEW OF SYSTEMS:  CARDIOVASCULAR:  She denies any heart disease or  heart attack.  PULMONARY:  She denies pneumonia or tuberculosis.  GI:  She denies peptic ulcer disease, hepatitis, colitis or diverticulitis.  GU:  She denies kidney stones.  ENDOCRINE:  She denies diabetes, thyroid  disease or hypercholesterolemia.  NEUROLOGIC:  No strokes or seizures.  HEMATOLOGIC:  She denies any bleeding disorders, blood clots or  transfusions.   PHYSICAL EXAMINATION:  GENERAL:  An overweight ill-appearing female.  She is having some vomiting intermittently.  VITAL SIGNS:  Temperature is 97.6, blood pressure is 133/85, pulse 99.  HEENT:  Extraocular motions intact.  No icterus.  NECK:  Supple without obvious masses.  RESPIRATORY:  Breath sounds are equal and clear.  Respirations  unlabored.  CARDIOVASCULAR:  Heart demonstrates a regular rate with a regular  rhythm.  I had do not appreciate a murmur.  No lower extremity edema.  ABDOMEN:  Soft and obese.  There is a small subumbilical scar with a  little bit of supraumbilical fullness.  There is tenderness to palpation  and percussion in the right lower quadrant region.  No obvious mass.  There is a lower transverse incision and smaller upper abdominal  incisions present.  MUSCULOSKELETAL:  Good muscle tone and  range of motion.   LABORATORY DATA:  Remarkable for white blood cell count of 22,200.  UA  negative.  Electrolytes within normal limits.   CT scan was reviewed.  It demonstrates some mildly dilated appendix with  some inflammatory changes and free fluid.  What appears to be a small  umbilical hernia is noted.  Prominent uterus consistent with adenomyosis  is noted.   IMPRESSION:  Early acute appendicitis.   PLAN:  Laparoscopy with appendectomy.  I have discussed the procedure  rationale and risks with the patient.  The risks include but are not  limited to bleeding, infection, wound healing problems, anesthesia,  accidental damage to intra-abdominal organs, and possibly finding other  pathology.  She seems understand to this and agrees to proceed.  We will  start her on intravenous antibiotics as well.      Adolph Pollack, M.D.   Electronically Signed     TJR/MEDQ  D:  12/01/2006  T:  12/01/2006  Job:  [id number]

## 2010-07-24 NOTE — Op Note (Signed)
NAMECRYSTOL, WALPOLE             ACCOUNT NO.:  [id number]   MEDICAL RECORD NO.:  [id number]          PATIENT TYPE:  INP   LOCATION:  1610                         FACILITY:  [facility]   PHYSICIAN:  Adolph Pollack, M.D.DATE OF BIRTH:  Aug 08, 1975   DATE OF PROCEDURE:  12/01/2006  DATE OF DISCHARGE:                               OPERATIVE REPORT   PREOPERATIVE DIAGNOSIS:  Early acute appendicitis.   POSTOPERATIVE DIAGNOSIS:  Early acute appendicitis.   PROCEDURE:  Laparoscopic appendectomy.   SURGEON:  Adolph Pollack, M.D.   ANESTHESIA:  General.   INDICATIONS:  35 year old female presented to the emergency room with  severe lower abdominal pain and was evaluated, had a white cell count  22,000.  CT scan was consistent with a dilated appendix and some  inflammatory change around appendix consistent with early acute  appendicitis.  She is now brought to the operating room.   TECHNIQUE:  She is brought to the operating room, placed supine on the  operating table.  General anesthetic was administered.  A Foley catheter  placed in bladder.  The abdominal wall was sterilely prepped and draped.  Marcaine was infiltrated at the site of her previous small subumbilical  incision.  This incision was reincised sharply.  Incision carried down  to the fascia.  A small incision was made in the fascia and the  peritoneal cavity was entered under direct vision.  A pursestring suture  of 0 Vicryl was placed around the fascial edges.  A Hasson trocar was  introduced in the peritoneal cavity.  Pneumoperitoneum created by  insufflation of CO2 gas.   Next the laparoscope was introduced.  I placed a 5 mm trocar in the  right upper quadrant and one in the left lower quadrant.  The appendix  appeared to be mildly inflamed but not ruptured.  There is a small  amount of straw-colored pelvic fluid.   The mesoappendix was grasped and retracted near the tip of the appendix  and the appendix was  retracted anteriorly.  I divided the mesoappendix  down the base using harmonic scalpel.  I then amputated the appendix off  the cecum with the Endo-GIA stapler.  I placed the appendix in an  Endopouch bag and removed it through the subumbilical port.   The subumbilical trocar was replaced.  I then copiously irrigated out  the right lower quadrant region with saline solution and evacuated as  much of this possible.  I inspected the abdomen.  I saw no evidence of  sigmoid diverticulitis.  No inflammation of the small bowel or the  distal stomach.  The liver appeared to be within normal limits as well.   Following this I removed the left lower quadrant trocar.  No bleeding  was noted.  The subumbilical trocar was removed.  There were some  adhesions of the omentum around it but no bleeding was noted from it.  The subumbilical fascial defect was closed under laparoscopic vision by  tightening up and tying down the pursestring suture.  The remaining  trocar was removed and pneumoperitoneum released.   The skin incisions were  closed with 4-0 Monocryl subcuticular stitches  followed by Steri-Strips and sterile dressings.  She tolerated procedure  without apparent complications was taken to recovery in satisfactory  condition.      Adolph Pollack, M.D.  Electronically Signed     TJR/MEDQ  D:  12/01/2006  T:  12/01/2006  Job:  [id number]

## 2010-07-27 NOTE — Discharge Summary (Signed)
NAMEGERDA, [name]             ACCOUNT NO.:  [id number]   MEDICAL RECORD NO.:  [id number]          PATIENT TYPE:  LINP   LOCATION:                               FACILITY:  [facility]   PHYSICIAN:  Adolph Pollack, M.D.DATE OF BIRTH:  April 30, 1975   DATE OF ADMISSION:  12/01/2006  DATE OF DISCHARGE:  12/03/2006                               DISCHARGE SUMMARY   PRINCIPAL DISCHARGE DIAGNOSIS:  Acute appendicitis.   SECONDARY DIAGNOSES:  1. Hypertension.  2. Asthma.  3. History of pyelonephritis.   PROCEDURE:  Laparoscopic appendectomy December 01, 2006.   REASON FOR ADMISSION:  This 35 year old female had severe lower  abdominal pain that persisted.  She then presented to the emergency  department the evening of admission and is noted to have right lower  quadrant tenderness and leukocytosis.  A CT scan of the abdomen  demonstrated early acute appendicitis and she was admitted.   HOSPITAL COURSE:  She was admitted.  She was taken to the operating  room, given intravenous antibiotics and underwent a laparoscopic  appendectomy which he tolerated fairly well.  She had a little bit of  nausea and vomiting postoperatively, but by her second postop day was  tolerating her diet, was afebrile and able to be discharged.   DISPOSITION:  Discharged to home on December 03, 2006, in satisfactory  condition.  She was given discharge instructions and prescription for  Tylox for pain.  There will be no change in her home medications.  She  will follow up in the office in 2 to 3 weeks.      Adolph Pollack, M.D.  Electronically Signed     TJR/MEDQ  D:  12/18/2006  T:  12/18/2006  Job:  [id number]

## 2010-07-27 NOTE — H&P (Signed)
[name], Wolf                       ACCOUNT NO.:  [id number]   MEDICAL RECORD NO.:  [id number]                   PATIENT TYPE:  INP   LOCATION:  0102                                 FACILITY:  [facility]   PHYSICIAN:  Sandria Bales. Ezzard Standing, M.D.               DATE OF BIRTH:  10/09/1974   DATE OF ADMISSION:  11/25/2002  DATE OF DISCHARGE:                                HISTORY & PHYSICAL   HISTORY OF PRESENT ILLNESS:  This is a 35 year old, African-American female  who actually was living in [facility] until two weeks ago, but she has moved  up here to live with her sister.  She was see initially, she thinks, in  about May 2004, with gallbladder symptoms at [facility].  She saw a surgeon with [facility] with Dr. Jeralene Peters.  I am a little  bit unclear as why it took so long to be seen, but apparently returned to  the emergency room once in July, and once again in August.  She never really  got scheduled for gallbladder surgery as best I can tell and now has  appeared to the [facility] Emergency Room early this morning with nausea,  vomiting and right upper quadrant abdominal pain.   She has had symptoms on and off for the past three to four months.  She says  she did work at [employer] in [place], but really since about May has not  been working at least in part because of her recurrent abdominal pain and  illness.  She denies history of peptic ulcer disease, liver disease,  pancreatic disease or change in bowel habits.  Her only prior abdominal  surgery was a tubal ligation in December 2003.   ALLERGIES:  No known drug allergies.   MEDICATIONS:  Other than pain medication, she is on no current medications.   REVIEW OF SYMPTOMS:  NEUROLOGIC:  No seizure or loss of consciousness.  PULMONARY:  She smokes about a 1/2 pack of cigarettes per day.  She knows  this is bad for her health, but has not been able to stop.  She has no  history of pneumonia or  tuberculosis.  CARDIAC:  No history of hypertension,  chest pain, angina or cardiac evaluation.  GASTROINTESTINAL:  See history of  present illness.  GENITOURINARY:  No history of kidney stones, although she  was hospitalized for pyelonephritis with E. coli in 2002.  She has had no  residual kidney problems.  She has not seen a urologist.   SOCIAL HISTORY:  She is by herself in the emergency room.  She is married.  Her sister, who she lives with, is not here and her mother said she would  come up later today.  She does have six children, ages 68, 14, 50, 78, 5, and  2.   PHYSICAL EXAMINATION:  VITAL SIGNS:  Temperature 97.2, blood pressure  119/73, pulse  71, respirations 20.  GENERAL:  She is a well-nourished, pleasant, African-American female, alert  and cooperative on physical exam.  HEENT:  Unremarkable.  NECK:  Supple, no mass or thyromegaly.  No lymphadenopathy.  LUNGS:  Clear to auscultation.  HEART:  Regular rate and rhythm with no murmur or rub.  ABDOMEN:  Mild soreness in the right upper quadrant without guarding or  rebound.  I cannot really feel a mass, but she is moderately obese which  limits the exam to some extent.  She does have a scar at her umbilicus from  a prior tubal ligation.  I feel no other mass or lesion.  EXTREMITIES:  She has normal gait with good strength in the upper and lower  extremities.  NEUROLOGIC:  Grossly intact.   LABORATORY DATA AND X-RAY FINDINGS:  White blood count 13,000, hemoglobin  12, hematocrit 35.  Her urinalysis was unremarkable.  Her urine pregnancy  was negative.  Her sodium was 135, potassium 3.7, chloride 102, CO2 25,  glucose 87.  Her Alk phos 59, total bilirubin 0.5, amylase 110, lipase 24.  An ultrasound shows multiple small gallstones with questionable early  changes of acute cholecystitis.   ASSESSMENT/PLAN:  1. Ms. [name] has a subacute cholecystitis with cholelithiasis.  I discussed     with her about gallbladder surgery.   She actually already contemplated     this through [facility], again I am not sure the reason for all the     delays.  I discussed with her the indication for her surgery, the     potential complication.  I discussed complications including, but not     limited to, bleeding, infection, open gallbladder surgery, bowel injury     and bile duct injury.  She understands this well, but I think with her     persistent symptoms, nausea, vomiting and pain, she is ready to go ahead     with surgery and will admit her today with plan to do her cholecystectomy     later today.  2. History of smoking cigarettes.  She knows this is bad for her health.  3. Multiparity.  4. Remote history of pyelonephritis secondary to Escherichia coli.                                               Sandria Bales. Ezzard Standing, M.D.    DHN/MEDQ  D:  11/25/2002  T:  11/25/2002  Job:  [id number]   cc:   Alyce Pagan, M.D.  [place], [place]

## 2010-07-27 NOTE — Op Note (Signed)
NAMECARLOTA, [name]                       ACCOUNT NO.:  [id number]   MEDICAL RECORD NO.:  [id number]                   PATIENT TYPE:  INP   LOCATION:  0348                                 FACILITY:  [facility]   PHYSICIAN:  Sandria Bales. Ezzard Standing, M.D.               DATE OF BIRTH:  10/09/1974   DATE OF PROCEDURE:  11/25/2002  DATE OF DISCHARGE:                                 OPERATIVE REPORT   PREOPERATIVE DIAGNOSES:  Cholecystitis with cholelithiasis.   POSTOPERATIVE DIAGNOSES:  Mild cholecystitis with cholelithiasis.   PROCEDURE:  Laparoscopic cholecystectomy with intraoperative cholangiogram.   SURGEON:  Sandria Bales. Ezzard Standing, MD   FIRST ASSISTANT:  None.   ANESTHESIA:  General endotracheal with about 8 mL of 0.25% Marcaine at the  end of the case.   COMPLICATIONS:  None.   INDICATIONS FOR PROCEDURE:  [name] is a 35 year old black female, who  has had recurrent symptomatic cholelithiasis, presents acutely to the [facility] Emergency Room today.  Apparently she has been down to the [facility]  ER 2-3 times at least before tonight.  She has by ultrasound multiple  gallstones with a mildly elevated white blood count and sore/tender right  upper quadrant and now comes for attempted laparoscopic cholecystectomy.   I discussed with her the indications and potential complications of  gallbladder surgery. Potential complications include but not limited to  bleeding, infection, open surgery, bile duct injury, bowel injury.   DESCRIPTION OF PROCEDURE:  The patient presents to the operating room.  She  is on Ancef 1 g IV q.6-8h.  She had PAS stockings in place.  We did put a  Foley at the beginning of the case if she had to void.  This was removed at  the end of the case.  Her abdomen was prepped with Betadine solution and  sterilely draped.  An infraumbilical incision was made with sharp dissection  carried down to the abdominal cavity.  A 10 mm 0-degree laparoscope was  inserted  through a 12 mm Hasson trocar and abdominal exploration carried out  using a 0-degree laparoscope.  Right and left lobes of the liver were  unremarkable.  Anterior wall of the stomach is unremarkable.  The bowel,  from what I could feel, was unremarkable.  She had kind of a multilobulated  fibroid-looking uterus and some adhesions to the lower midline from her  prior surgery.   I then placed three additional trocars, a 10 mm subxiphoid trocar, a 5 mm  right mid subcostal trocar, and a 5 mm lateral subcostal trocar.  The  gallbladder was grasped, rotated cephalad.  Again, had minimal if any  inflammation around the gallbladder.  She was not acute-looking at least  surgically as I thought she was preoperatively, identified a cystic duct,  cystic artery.  Cystic artery was triply Endo Clipped and divided.  Cystic  duct clip placed in the gallbladder side of  the cystic duct.   I then shot an intraoperative cholangiogram, using a cutoff taut catheter  through a 14 gauge Jelco catheter inserted into the side of the cut cystic  duct and then secured with an Endo Clip.   Half-strength Hypaque solution, about 6 mL was injected down the cystic duct  into the common bile duct, into the duodenum, and refluxed up the hepatic  radicles.  There was no filling defect, no mass within the common bile duct,  and this was felt to be a normal intraoperative cholangiogram.   The taut catheter was then removed, the cystic duct triply Endo Clipped and  divided.  The gallbladder was sharply and bluntly dissected from the  gallbladder bed.  Hemostasis was controlled with Bovie electrocautery.  Prior to complete division of the gallbladder from the gallbladder bed, I re-  visualized the triangle of Calot, re-visualized the gallbladder bed, saw no  bleeding or bile leak, and the gallbladder was then divided and delivered  through the umbilicus in an EndoCatch bag and sent to pathology.   I then removed each  trocar in turn.  The umbilical trocar was closed with a  0 Vicryl suture.  The skin at each site was closed with a 5-0 Monocryl  suture, painted with tincture of Benzoin and Steri-Strip'd.  The patient  tolerated the procedure well, was transported to the recovery room and will  be kept overnight with plan to discharge tomorrow.                                               Sandria Bales. Ezzard Standing, M.D.    DHN/MEDQ  D:  11/25/2002  T:  11/26/2002  Job:  [id number]

## 2010-09-20 ENCOUNTER — Emergency Department (HOSPITAL_COMMUNITY)
Admission: EM | Admit: 2010-09-20 | Discharge: 2010-09-20 | Disposition: A | Discharge status: Home / Self Care | Payer: Self-pay | Attending: Emergency Medicine | Admitting: Emergency Medicine

## 2010-09-20 DIAGNOSIS — K219 Gastro-esophageal reflux disease without esophagitis: Secondary | ICD-10-CM | POA: Insufficient documentation

## 2010-09-20 DIAGNOSIS — K589 Irritable bowel syndrome without diarrhea: Secondary | ICD-10-CM | POA: Insufficient documentation

## 2010-09-20 DIAGNOSIS — R1013 Epigastric pain: Secondary | ICD-10-CM | POA: Insufficient documentation

## 2010-09-20 DIAGNOSIS — R12 Heartburn: Secondary | ICD-10-CM | POA: Insufficient documentation

## 2010-09-20 DIAGNOSIS — R350 Frequency of micturition: Secondary | ICD-10-CM | POA: Insufficient documentation

## 2010-09-20 DIAGNOSIS — N390 Urinary tract infection, site not specified: Secondary | ICD-10-CM | POA: Insufficient documentation

## 2010-09-20 DIAGNOSIS — R112 Nausea with vomiting, unspecified: Secondary | ICD-10-CM | POA: Insufficient documentation

## 2010-09-20 DIAGNOSIS — R141 Gas pain: Secondary | ICD-10-CM | POA: Insufficient documentation

## 2010-09-20 DIAGNOSIS — R142 Eructation: Secondary | ICD-10-CM | POA: Insufficient documentation

## 2010-09-20 DIAGNOSIS — I10 Essential (primary) hypertension: Secondary | ICD-10-CM | POA: Insufficient documentation

## 2010-09-20 LAB — COMPREHENSIVE METABOLIC PANEL
ALT: 14 U/L (ref 0–35)
Alkaline Phosphatase: 62 U/L (ref 39–117)
CO2: 26 mEq/L (ref 19–32)
GFR calc Af Amer: >60 mL/min (ref >60)
GFR calc non Af Amer: >60 mL/min (ref >60)
Glucose, Bld: 91 mg/dL (ref 70–99)
Potassium: 3.3 mEq/L — ABNORMAL LOW (ref 3.5–5.1)
Sodium: 136 mEq/L (ref 135–145)
Total Protein: 7.3 g/dL (ref 6.0–8.3)

## 2010-09-20 LAB — DIFFERENTIAL
Eosinophils Relative: 1 % (ref 0–5)
Lymphocytes Relative: 23 % (ref 12–46)
Lymphs Abs: 2.1 10*3/uL (ref 0.7–4.0)
Monocytes Absolute: 0.5 10*3/uL (ref 0.1–1.0)

## 2010-09-20 LAB — URINALYSIS, ROUTINE W REFLEX MICROSCOPIC
Bilirubin Urine: NEGATIVE
Ketones, ur: NEGATIVE mg/dL
Protein, ur: NEGATIVE mg/dL
Urobilinogen, UA: 1 mg/dL (ref 0.0–1.0)

## 2010-09-20 LAB — CBC
HCT: 34.4 % — ABNORMAL LOW (ref 36.0–46.0)
MCHC: 34.6 g/dL (ref 30.0–36.0)
MCV: 88.9 fL (ref 78.0–100.0)
RDW: 14.1 % (ref 11.5–15.5)

## 2010-09-21 LAB — URINE CULTURE: Culture  Setup Time: 201207121706

## 2010-09-28 ENCOUNTER — Encounter: Payer: Self-pay | Admitting: Family Medicine

## 2010-09-28 ENCOUNTER — Ambulatory Visit (INDEPENDENT_AMBULATORY_CARE_PROVIDER_SITE_OTHER): Payer: Self-pay | Admitting: Family Medicine

## 2010-09-28 ENCOUNTER — Ambulatory Visit: Payer: Self-pay | Admitting: Family Medicine

## 2010-09-28 VITALS — BP 115/82 | HR 73 | Temp 97.6°F | Ht 61.0 in | Wt 160.0 lb

## 2010-09-28 DIAGNOSIS — F1411 Cocaine abuse, in remission: Secondary | ICD-10-CM

## 2010-09-28 DIAGNOSIS — I10 Essential (primary) hypertension: Secondary | ICD-10-CM

## 2010-09-28 DIAGNOSIS — F1910 Other psychoactive substance abuse, uncomplicated: Secondary | ICD-10-CM

## 2010-09-28 DIAGNOSIS — K219 Gastro-esophageal reflux disease without esophagitis: Secondary | ICD-10-CM

## 2010-09-28 DIAGNOSIS — R1900 Intra-abdominal and pelvic swelling, mass and lump, unspecified site: Secondary | ICD-10-CM

## 2010-09-28 DIAGNOSIS — R109 Unspecified abdominal pain: Secondary | ICD-10-CM

## 2010-09-28 DIAGNOSIS — F17200 Nicotine dependence, unspecified, uncomplicated: Secondary | ICD-10-CM

## 2010-09-28 DIAGNOSIS — F419 Anxiety disorder, unspecified: Secondary | ICD-10-CM

## 2010-09-28 DIAGNOSIS — F411 Generalized anxiety disorder: Secondary | ICD-10-CM

## 2010-09-28 DIAGNOSIS — Z720 Tobacco use: Secondary | ICD-10-CM

## 2010-09-28 HISTORY — DX: Other psychoactive substance abuse, uncomplicated: F19.10

## 2010-09-28 LAB — POCT H PYLORI SCREEN: H Pylori Screen, POC: POSITIVE

## 2010-09-28 MED ORDER — OMEPRAZOLE 20 MG PO CPDR: 20.0000 mg | DELAYED_RELEASE_CAPSULE | Freq: Every day | ORAL | Status: DC

## 2010-09-28 MED ORDER — PAROXETINE HCL 20 MG PO TABS: 20.0000 mg | ORAL_TABLET | ORAL | Status: DC

## 2010-09-28 NOTE — Patient Instructions (Signed)
Nice to meet you Make follow-up appt for physical.  We will do our pap smear at that time Don't eat or drink anythign but water for 8 hours and we will check your labs Today we will check your lab for H. Pylori New medicine- paroxetine  Diet for GERD or PUD Nutrition therapy can help ease the discomfort of gastroesophageal reflux disease (GERD) and peptic ulcer disease (PUD).   HOME CARE INSTRUCTIONS  Eat your meals slowly, in a relaxed setting.   Eat 5 to 6 small meals per day.   If a food causes distress, stop eating it for a period of time.  FOODS TO AVOID:  Coffee, regular or decaffeinated.   Cola beverages, regular or low calorie.     Tea, regular or decaffeinated.     Pepper.    Cocoa.    High fat foods including meats.     Butter, margarine, hydrogenated oil (trans fats).   Peppermint or spearmint (if you have GERD).     Fruits and vegetables as tolerated.     Alcoholic beverages.     Nicotine (smoking or chewing). This is one of the most potent stimulants to acid production in the gastrointestinal tract.     Any food that seems to aggravate your condition.     If you have questions regarding your diet, call your caregiver's office or a registered dietitian. OTHER TIPS IF YOU HAVE GERD:  Lying flat may make symptoms worse. Keep the head of your bed raised 6 to 9 inches by using a foam wedge or blocks under the legs of the bed.   Do not lay down until 3 hours after eating a meal.   Daily physical activity may help reduce symptoms.  MAKE SURE YOU:    Understand these instructions.   Will watch your condition.   Will get help right away if you are not doing well or get worse.  Document Released: 02/25/2005 Document Re-Released: 07/14/2008 [facility] Patient Information 2011 [place], [place].

## 2010-09-28 NOTE — Assessment & Plan Note (Addendum)
Will check H Pylori given extent of symptoms.  Will start PPI therapy with omeprazole 20 mg daily.  Update:  Called patient, a man stating he was her husband called.  He said she did have a cell phone number but did not provide it to me.  I would like patient to take omeprazole BID, amoxicillin 1 gm bid, and clarithromycin 1 gm BID.  Will try to call her again on monday

## 2010-09-28 NOTE — Progress Notes (Signed)
  Subjective:    Patient ID: [name], female    DOB: February 01, 1976, 35 y.o.   MRN: [id number]  HPIHere to establish primary care.  Was referred from the ER to [facility] who referred her to [place].  She is most concerned with restarting her medications.  She states she takes them for the following: HTN: HCTZ IBS: belladonna, lorazepam, percocet GERD: omeprazole  Mental health: GAD;7: 7; mild anxiety, somewhat difficult PHQ 9: 8: mild depression MDQ: yes to 9, noted does not cause her problems, when reviewing her yes answers, they have reasonable explanations.    Review of Systems  Constitutional: Negative for fever and chills.  HENT: Negative for hearing loss and sore throat.   Respiratory: Positive for apnea. Negative for cough and shortness of breath.   Cardiovascular: Negative for chest pain and palpitations.  Gastrointestinal: Positive for vomiting and abdominal pain. Negative for nausea, diarrhea and constipation.  Musculoskeletal: Negative for back pain and arthralgias.  Neurological: Positive for dizziness.  Psychiatric/Behavioral: The patient is nervous/anxious.     I spent >45 minutes with the patient, over 50% of which was spent in counseling and/or coordination of care as noted.      Objective:   Physical Exam GEN: Alert & Oriented, No acute distress.  Slightly pressured speech.  Good health literacy. CV:  Regular Rate & Rhythm, no murmur Respiratory:  Normal work of breathing, CTAB Abd:  + BS, soft, no tenderness to palpation Ext: no pre-tibial edema        Assessment & Plan:

## 2010-09-28 NOTE — Assessment & Plan Note (Signed)
She has been successful with cutting back.  Advised we have resources for help if she needs in the future.

## 2010-09-28 NOTE — Assessment & Plan Note (Signed)
Patient states hx of hypertension not currently on any medication.  Had previously been on HCTZ.  Advised to keep BP log, bring to next appointment.  At her BP today, would not start tx

## 2010-09-28 NOTE — Assessment & Plan Note (Signed)
History suggests anxiety, patient notes all symptoms have been in the last 3 years since her father died.  I reviewed PHQ9, GAD, MSDS, may have some bipolar component.  Upon further questioning it is unclear if patient has had an episode of mania, appears to never have had any depression.  Will start Paxil for anxiety, may also help with what patient feels like is IBS.  Discussed symptoms suggesting mania, if unmasks will reconsider treatment.

## 2010-09-28 NOTE — Assessment & Plan Note (Signed)
She feels it is due to GERD + IBS.  There is no evidence for this diagnosis and may have been mentioned in an ER visit as patient has not had any primary care.  She notes she is treated for this "spasm" with lorazepam, donnatal and percocet.  Has also tried bentyl in the past.  Will first treat GERD, start SSRI for anxiety which may have an effect.  If not effective for anxiety, would consider TCA.

## 2010-10-01 ENCOUNTER — Telehealth: Payer: Self-pay | Admitting: Family Medicine

## 2010-10-01 MED ORDER — CLARITHROMYCIN 500 MG PO TABS: 500.0000 mg | ORAL_TABLET | Freq: Two times a day (BID) | ORAL | Status: DC

## 2010-10-01 MED ORDER — AMOXICILLIN 500 MG PO CAPS: 1000.0000 mg | ORAL_CAPSULE | Freq: Two times a day (BID) | ORAL | Status: AC

## 2010-10-01 NOTE — Telephone Encounter (Signed)
Discussed triple therapy with amox, clarithromycn, and omeprazole bid for 2 weeks.  Will wait to start paxil until finsihed with this.

## 2010-10-02 ENCOUNTER — Telehealth: Payer: Self-pay | Admitting: Family Medicine

## 2010-10-02 MED ORDER — METRONIDAZOLE 500 MG PO TABS: 500.0000 mg | ORAL_TABLET | Freq: Two times a day (BID) | ORAL | Status: AC

## 2010-10-02 NOTE — Telephone Encounter (Signed)
Please let patient know i sent in metronidazole in place of clarithromycin.  Still take amoxicllin and omeprazole with it.

## 2010-10-02 NOTE — Telephone Encounter (Signed)
The clarithromycin costs too much, the pharmacist said Ciprofloxin is the closest thing and is on the $4 plan and would to try that instead.  Please send an Rx to [facility] on [address].

## 2010-10-15 ENCOUNTER — Ambulatory Visit: Payer: Self-pay | Admitting: Family Medicine

## 2010-10-24 ENCOUNTER — Encounter: Payer: Self-pay | Admitting: Family Medicine

## 2010-10-24 ENCOUNTER — Other Ambulatory Visit (HOSPITAL_COMMUNITY)
Admission: RE | Admit: 2010-10-24 | Discharge: 2010-10-24 | Disposition: A | Discharge status: Home / Self Care | Payer: Self-pay | Source: Ambulatory Visit | Attending: Family Medicine | Admitting: Family Medicine

## 2010-10-24 ENCOUNTER — Ambulatory Visit (INDEPENDENT_AMBULATORY_CARE_PROVIDER_SITE_OTHER): Payer: Self-pay | Admitting: Family Medicine

## 2010-10-24 VITALS — BP 124/88 | HR 67 | Temp 98.5°F | Ht 61.0 in | Wt 164.0 lb

## 2010-10-24 DIAGNOSIS — F411 Generalized anxiety disorder: Secondary | ICD-10-CM

## 2010-10-24 DIAGNOSIS — E669 Obesity, unspecified: Secondary | ICD-10-CM

## 2010-10-24 DIAGNOSIS — Z124 Encounter for screening for malignant neoplasm of cervix: Secondary | ICD-10-CM

## 2010-10-24 DIAGNOSIS — Z01419 Encounter for gynecological examination (general) (routine) without abnormal findings: Secondary | ICD-10-CM

## 2010-10-24 DIAGNOSIS — I10 Essential (primary) hypertension: Secondary | ICD-10-CM

## 2010-10-24 DIAGNOSIS — F419 Anxiety disorder, unspecified: Secondary | ICD-10-CM

## 2010-10-24 DIAGNOSIS — K219 Gastro-esophageal reflux disease without esophagitis: Secondary | ICD-10-CM

## 2010-10-24 LAB — COMPREHENSIVE METABOLIC PANEL
ALT: 29 U/L (ref 0–35)
AST: 23 U/L (ref 0–37)
Albumin: 4.1 g/dL (ref 3.5–5.2)
BUN: 11 mg/dL (ref 6–23)
CO2: 24 mEq/L (ref 19–32)
Calcium: 8.9 mg/dL (ref 8.4–10.5)
Chloride: 103 mEq/L (ref 96–112)
Potassium: 3.9 mEq/L (ref 3.5–5.3)

## 2010-10-24 LAB — TSH: TSH: 0.498 u[IU]/mL (ref 0.350–4.500)

## 2010-10-24 LAB — LIPID PANEL
Cholesterol: 189 mg/dL (ref 0–200)
HDL: 65 mg/dL (ref >39)
LDL Cholesterol: 108 mg/dL — ABNORMAL HIGH (ref 0–99)
Triglycerides: 80 mg/dL (ref <150)

## 2010-10-24 LAB — CBC
HCT: 34.2 % — ABNORMAL LOW (ref 36.0–46.0)
MCV: 91.9 fL (ref 78.0–100.0)
Platelets: 459 10*3/uL — ABNORMAL HIGH (ref 150–400)
RBC: 3.72 MIL/uL — ABNORMAL LOW (ref 3.87–5.11)
RDW: 15.7 % — ABNORMAL HIGH (ref 11.5–15.5)
WBC: 11.6 10*3/uL — ABNORMAL HIGH (ref 4.0–10.5)

## 2010-10-24 NOTE — Patient Instructions (Signed)
Follow-up in 3-4 weeks Let me know if you have problems with paroxetine

## 2010-10-24 NOTE — Progress Notes (Signed)
  Subjective:    Patient ID: [name], female    DOB: 04/08/1975, 35 y.o.   MRN: [id number]  HPI  Annual Gynecological Exam  [id number] Wt Readings from Last 3 Encounters:  10/24/10 164 lb (74.39 kg)  09/28/10 160 lb (72.576 kg)   Last period: 7/30 Regular periods: yes Heavy bleeding: no  Sexually active: yes Birth control or hormonal therapy:BTL Hx of STD: Patient desires STD screening Dyspareunia: No Hot flashes: No Vaginal discharge: no Dysuria: yes, got tx in ER for UTI   Last mammogram: n/a Breast mass or concerns: No Last Pap: abnormal in 2001 with biopsy showing "precancerous cells" and had cryotherapy History of abnormal pap: see above  FH of breast, uterine, ovarian, colon cancer: uterine cancer in grandmother.  H pylori: feels somewhat improved since completing course of antibiotics, is continuing to take ppi once daily  Anxiety:  Plans on starting paxil this week Review of Systems see ROS     Objective:   Physical Exam GEN: Alert & Oriented, No acute distress CV:  Regular Rate & Rhythm, no murmur Respiratory:  Normal work of breathing, CTAB Abd:  + BS, soft, no tenderness to palpation Ext: no pre-tibial edema' Pelvic Exam:        External: normal female genitalia without lesions or masses        Vagina: normal without lesions or masses        Cervix: normal without lesions or masses        Adnexa: normal bimanual exam without masses or fullness        Uterus: normal by palpation        Pap smear: performed             Assessment & Plan:  Annual gyn exam:  Screening UTD.  Pap performed.

## 2010-10-25 ENCOUNTER — Encounter: Payer: Self-pay | Admitting: Family Medicine

## 2010-10-25 NOTE — Assessment & Plan Note (Signed)
Will start paxil this week.  Reviewed expected symptoms, red flags to call

## 2010-10-25 NOTE — Assessment & Plan Note (Signed)
Just completed course of abx.  Advised ppi once daily, will reasses if continues to be symtpomatic.

## 2010-10-29 ENCOUNTER — Encounter: Payer: Self-pay | Admitting: Family Medicine

## 2010-12-03 LAB — D-DIMER, QUANTITATIVE: D-Dimer, Quant: 0.32

## 2010-12-03 LAB — BASIC METABOLIC PANEL
CO2: 29
Chloride: 104
Creatinine, Ser: 0.52
GFR calc Af Amer: >60
Potassium: 3.6
Sodium: 136

## 2010-12-03 LAB — CBC
HCT: 33.5 — ABNORMAL LOW
Hemoglobin: 11.1 — ABNORMAL LOW
MCHC: 33.1
MCV: 91
RBC: 3.68 — ABNORMAL LOW

## 2010-12-03 LAB — DIFFERENTIAL
Basophils Relative: 0
Eosinophils Absolute: 0.1
Eosinophils Relative: 1
Lymphs Abs: 3
Monocytes Absolute: 0.6
Monocytes Relative: 6

## 2010-12-03 LAB — POCT CARDIAC MARKERS
Operator id: 4531
Troponin i, poc: <0.05

## 2010-12-06 LAB — COMPREHENSIVE METABOLIC PANEL
AST: 19
Alkaline Phosphatase: 51
BUN: 7
CO2: 24
Chloride: 105
Creatinine, Ser: 0.45
GFR calc non Af Amer: >60
Potassium: 3.6
Total Bilirubin: 0.4

## 2010-12-06 LAB — URINE MICROSCOPIC-ADD ON

## 2010-12-06 LAB — POCT CARDIAC MARKERS
Myoglobin, poc: 37.8
Operator id: 231701
Operator id: 294591
Troponin i, poc: 0.08 — ABNORMAL HIGH

## 2010-12-06 LAB — DIFFERENTIAL
Basophils Relative: 1
Eosinophils Absolute: 0.1
Eosinophils Relative: 1
Monocytes Absolute: 0.4
Monocytes Relative: 4
Neutrophils Relative %: 74

## 2010-12-06 LAB — CBC
HCT: 36.1
Hemoglobin: 12.1
MCHC: 33.5
MCV: 92
RBC: 3.92

## 2010-12-06 LAB — URINALYSIS, ROUTINE W REFLEX MICROSCOPIC
Bilirubin Urine: NEGATIVE
Hgb urine dipstick: NEGATIVE
Ketones, ur: NEGATIVE
Protein, ur: NEGATIVE
Urobilinogen, UA: 0.2

## 2010-12-12 LAB — WET PREP, GENITAL: Clue Cells Wet Prep HPF POC: NONE SEEN

## 2010-12-12 LAB — GC/CHLAMYDIA PROBE AMP, GENITAL
Chlamydia, DNA Probe: NEGATIVE
GC Probe Amp, Genital: NEGATIVE

## 2010-12-20 LAB — DIFFERENTIAL
Basophils Absolute: 0.4 — ABNORMAL HIGH
Eosinophils Absolute: 0
Lymphocytes Relative: 11 — ABNORMAL LOW
Monocytes Relative: 6
Neutro Abs: 18.1 — ABNORMAL HIGH
Neutrophils Relative %: 81 — ABNORMAL HIGH

## 2010-12-20 LAB — BASIC METABOLIC PANEL
BUN: 5 — ABNORMAL LOW
CO2: 26
CO2: 29
Calcium: 8.7
Calcium: 9.1
Creatinine, Ser: 0.47
Creatinine, Ser: 0.54
GFR calc Af Amer: >60
GFR calc non Af Amer: >60
GFR calc non Af Amer: >60
Glucose, Bld: 88
Sodium: 137
Sodium: 140

## 2010-12-20 LAB — URINALYSIS, ROUTINE W REFLEX MICROSCOPIC
Protein, ur: NEGATIVE
Specific Gravity, Urine: 1.016
Urobilinogen, UA: 1

## 2010-12-20 LAB — CBC
Hemoglobin: 11.5 — ABNORMAL LOW
MCHC: 33.2
MCHC: 34.5
Platelets: 379
RBC: 3.42 — ABNORMAL LOW
RDW: 12.9
RDW: 14.2 — ABNORMAL HIGH

## 2010-12-20 LAB — URINE MICROSCOPIC-ADD ON

## 2010-12-26 LAB — DIFFERENTIAL
Lymphs Abs: 2
Monocytes Relative: 4
Neutro Abs: 9.3 — ABNORMAL HIGH
Neutrophils Relative %: 78 — ABNORMAL HIGH

## 2010-12-26 LAB — BASIC METABOLIC PANEL
Calcium: 9.6
Chloride: 101
Creatinine, Ser: 0.58
GFR calc Af Amer: >60
Sodium: 139

## 2010-12-26 LAB — D-DIMER, QUANTITATIVE: D-Dimer, Quant: <0.22

## 2010-12-26 LAB — CBC
Hemoglobin: 12.7
RBC: 4
WBC: 11.9 — ABNORMAL HIGH

## 2012-03-02 ENCOUNTER — Emergency Department (HOSPITAL_COMMUNITY)
Admission: EM | Admit: 2012-03-02 | Discharge: 2012-03-02 | Disposition: A | Discharge status: Home / Self Care | Payer: Self-pay | Attending: Emergency Medicine | Admitting: Emergency Medicine

## 2012-03-02 ENCOUNTER — Emergency Department (HOSPITAL_COMMUNITY): Payer: Self-pay

## 2012-03-02 ENCOUNTER — Encounter (HOSPITAL_COMMUNITY): Payer: Self-pay | Admitting: *Deleted

## 2012-03-02 DIAGNOSIS — I10 Essential (primary) hypertension: Secondary | ICD-10-CM | POA: Insufficient documentation

## 2012-03-02 DIAGNOSIS — R0982 Postnasal drip: Secondary | ICD-10-CM | POA: Insufficient documentation

## 2012-03-02 DIAGNOSIS — R05 Cough: Secondary | ICD-10-CM

## 2012-03-02 DIAGNOSIS — F3289 Other specified depressive episodes: Secondary | ICD-10-CM | POA: Insufficient documentation

## 2012-03-02 DIAGNOSIS — R059 Cough, unspecified: Secondary | ICD-10-CM | POA: Insufficient documentation

## 2012-03-02 DIAGNOSIS — J029 Acute pharyngitis, unspecified: Secondary | ICD-10-CM | POA: Insufficient documentation

## 2012-03-02 DIAGNOSIS — J3489 Other specified disorders of nose and nasal sinuses: Secondary | ICD-10-CM | POA: Insufficient documentation

## 2012-03-02 DIAGNOSIS — K219 Gastro-esophageal reflux disease without esophagitis: Secondary | ICD-10-CM | POA: Insufficient documentation

## 2012-03-02 DIAGNOSIS — F329 Major depressive disorder, single episode, unspecified: Secondary | ICD-10-CM | POA: Insufficient documentation

## 2012-03-02 DIAGNOSIS — R1013 Epigastric pain: Secondary | ICD-10-CM | POA: Insufficient documentation

## 2012-03-02 DIAGNOSIS — R112 Nausea with vomiting, unspecified: Secondary | ICD-10-CM | POA: Insufficient documentation

## 2012-03-02 DIAGNOSIS — Z79899 Other long term (current) drug therapy: Secondary | ICD-10-CM | POA: Insufficient documentation

## 2012-03-02 DIAGNOSIS — R0789 Other chest pain: Secondary | ICD-10-CM | POA: Insufficient documentation

## 2012-03-02 DIAGNOSIS — F39 Unspecified mood [affective] disorder: Secondary | ICD-10-CM | POA: Insufficient documentation

## 2012-03-02 DIAGNOSIS — F17200 Nicotine dependence, unspecified, uncomplicated: Secondary | ICD-10-CM | POA: Insufficient documentation

## 2012-03-02 DIAGNOSIS — Z8719 Personal history of other diseases of the digestive system: Secondary | ICD-10-CM | POA: Insufficient documentation

## 2012-03-02 HISTORY — DX: Essential (primary) hypertension: I10

## 2012-03-02 HISTORY — DX: Depression, unspecified: F32.A

## 2012-03-02 HISTORY — DX: Major depressive disorder, single episode, unspecified: F32.9

## 2012-03-02 HISTORY — DX: Gastro-esophageal reflux disease without esophagitis: K21.9

## 2012-03-02 LAB — CBC
MCH: 30.1 pg (ref 26.0–34.0)
MCV: 88.5 fL (ref 78.0–100.0)
Platelets: 493 10*3/uL — ABNORMAL HIGH (ref 150–400)
RDW: 14.8 % (ref 11.5–15.5)
WBC: 9.9 10*3/uL (ref 4.0–10.5)

## 2012-03-02 LAB — BASIC METABOLIC PANEL
Calcium: 8.7 mg/dL (ref 8.4–10.5)
Creatinine, Ser: 0.64 mg/dL (ref 0.50–1.10)
GFR calc Af Amer: >90 mL/min (ref >90)
Sodium: 135 mEq/L (ref 135–145)

## 2012-03-02 MED ORDER — ONDANSETRON 8 MG PO TBDP
8.0000 mg | ORAL_TABLET | Freq: Once | ORAL | Status: AC
  Administered 2012-03-02: 8 mg via ORAL
  Filled 2012-03-02: qty 1

## 2012-03-02 MED ORDER — GI COCKTAIL ~~LOC~~
30.0000 mL | Freq: Once | ORAL | Status: AC
  Administered 2012-03-02: 30 mL via ORAL
  Filled 2012-03-02: qty 30

## 2012-03-02 MED ORDER — AZITHROMYCIN 500 MG PO TABS: 500.0000 mg | ORAL_TABLET | Freq: Every day | ORAL | Status: DC

## 2012-03-02 MED ORDER — MORPHINE SULFATE 4 MG/ML IJ SOLN
4.0000 mg | Freq: Once | INTRAMUSCULAR | Status: AC
  Administered 2012-03-02: 4 mg via INTRAVENOUS
  Filled 2012-03-02: qty 1

## 2012-03-02 NOTE — ED Notes (Signed)
Pt reports cough, chest pressure, abd pain, vomiting and generalized pain. Reports symptoms started 2 weeks ago. Reports started as scratchy throat but now has, abd pain, vomiting, and chest pressure.

## 2012-03-02 NOTE — ED Notes (Signed)
Discharge instructions reviewed. Rx given x1.  

## 2012-03-02 NOTE — ED Notes (Signed)
Pt c/o of pain meds. NP Thurston Hole made aware of pt's request.  No new orders at this time

## 2012-03-02 NOTE — ED Provider Notes (Signed)
History     CSN: [id number]  Arrival date & time 03/02/12  1533   First MD Initiated Contact with Patient 03/02/12 1641      Chief Complaint  Patient presents with  . Multiple Symptoms     (Consider location/radiation/quality/duration/timing/severity/associated sxs/prior treatment) Patient is a 36 y.o. female presenting with cough. The history is provided by the patient. No language interpreter was used.  Cough This is a chronic problem. The current episode started more than 1 week ago. The problem occurs every few minutes. The cough is productive of sputum. There has been no fever. Associated symptoms include rhinorrhea and sore throat. Pertinent negatives include no chills, no weight loss, no ear pain, no shortness of breath and no wheezing. She has tried cough syrup for the symptoms. The treatment provided mild relief. She is a smoker. Her past medical history is significant for bronchitis.   36 yo female multiple complaints with L chest pain that is fleeting lasting seconds 3 times since yesterday. Denies SOB , diaphoresis, radiation of pain but having nausea.    Also c/o epigastric pain since 12/13 that worsenend Saturday night. States she has had a cough since 12/13 as well that has worsened.  Vomited x 1 with no diarrhea.   She tried meprozol and maalox with no improvement. States the only thing that helps when it gets like this is GI cocktail and morphine.   Was seen by GI 1 year ago and put on these medications and told her that she has severe GERD.  No pmh of long trips or calf pain. Smoker.  PMH of cocaine abuse, hypertension, gerd, ibs, depression and mood swings.  Denies drugs today.     Past Medical History  Diagnosis Date  . History of cocaine abuse 09/28/2010  . Hypertension   . GERD (gastroesophageal reflux disease)   . IBS (irritable bowel syndrome)   . Depression   . Mood swings     Past Surgical History  Procedure Date  . Appendectomy   . Cholecystectomy   .  Tubal ligation   . Tonsillectomy     Family History  Problem Relation Age of Onset  . Hypertension Mother   . Heart disease Father 90    MI  . Stroke Father 33  . Diabetes Father   . GER disease Sister   . Cancer Maternal Grandmother     uterine and stomach    History  Substance Use Topics  . Smoking status: Current Every Day Smoker -- 0.3 packs/day    Types: Cigarettes  . Smokeless tobacco: Not on file  . Alcohol Use: No    OB History    Grav Para Term Preterm Abortions TAB SAB Ect Mult Living                  Review of Systems  Constitutional: Negative.  Negative for fever, chills and weight loss.  HENT: Positive for congestion, sore throat, rhinorrhea, postnasal drip and sinus pressure. Negative for ear pain, trouble swallowing, neck pain and neck stiffness.   Eyes: Negative.   Respiratory: Positive for cough. Negative for shortness of breath and wheezing.   Cardiovascular: Negative.   Gastrointestinal: Positive for nausea, vomiting and abdominal pain.       Epigastric pain  Neurological: Negative.   Psychiatric/Behavioral: Negative.   All other systems reviewed and are negative.    Allergies  Hydrocodone  Home Medications   Current Outpatient Rx  Name  Route  Sig  Dispense  Refill  . HYDROCHLOROTHIAZIDE 25 MG PO TABS   Oral   Take 25 mg by mouth daily.         Marland Kitchen OMEPRAZOLE 20 MG PO CPDR   Oral   Take 1 capsule (20 mg total) by mouth daily.   30 capsule   5   . PAROXETINE HCL 20 MG PO TABS   Oral   Take 1 tablet (20 mg total) by mouth every morning.   30 tablet   1     BP 123/76  Pulse 72  Temp 98.2 F (36.8 C) (Oral)  Resp 18  SpO2 100%  LMP 02/27/2012  Physical Exam  Nursing note and vitals reviewed. Constitutional: She is oriented to person, place, and time. She appears well-developed and well-nourished.  HENT:  Head: Normocephalic and atraumatic.  Eyes: Conjunctivae normal and EOM are normal. Pupils are equal, round, and  reactive to light.  Neck: Normal range of motion. Neck supple.  Cardiovascular: Normal rate and regular rhythm.   Pulmonary/Chest: Effort normal and breath sounds normal. No respiratory distress. She has no wheezes.  Abdominal: Soft. Bowel sounds are normal. She exhibits no distension. There is no tenderness.       Epigastric tenderness  Musculoskeletal: Normal range of motion. She exhibits no edema and no tenderness.  Neurological: She is alert and oriented to person, place, and time. She has normal reflexes.  Skin: Skin is warm and dry.  Psychiatric: She has a normal mood and affect.    ED Course  Procedures (including critical care time)   Labs Reviewed  CBC  BASIC METABOLIC PANEL   No results found.   No diagnosis found.    MDM   Date: 03/03/2012  Rate: 76  Rhythm: normal sinus rhythm  QRS Axis: normal  Intervals: normal  ST/T Wave abnormalities: normal  Conduction Disutrbances:none  Narrative Interpretation:   Old EKG Reviewed: unchanged Multiple complaints with cough, fleeting chest pain, epigastric pain, and vomiting x 1.  EKG unremarkable and unchanged.  Doubt acs atypical cp that is fleeting.  No further vomiting in the ER.  She will continue her GI medications for the GERD pain and follow up with GI call this week. Upper respiratory virus with GERD.  Chest x-ray with no pneumonia.  Will start z-pak for chronic cough > 1 week.  Follow up with pcp of choice.           Remi Haggard, NP 03/03/12 1020

## 2012-03-03 NOTE — ED Provider Notes (Signed)
Medical screening examination/treatment/procedure(s) were performed by non-physician practitioner and as supervising physician I was immediately available for consultation/collaboration.   Nekeshia Lenhardt L Alaa Eyerman, MD 03/03/12 1413 

## 2012-04-13 ENCOUNTER — Encounter (HOSPITAL_COMMUNITY): Payer: Self-pay | Admitting: *Deleted

## 2012-04-13 ENCOUNTER — Emergency Department (HOSPITAL_COMMUNITY)
Admission: EM | Admit: 2012-04-13 | Discharge: 2012-04-13 | Disposition: A | Discharge status: Home / Self Care | Payer: Self-pay | Attending: Emergency Medicine | Admitting: Emergency Medicine

## 2012-04-13 ENCOUNTER — Emergency Department (HOSPITAL_COMMUNITY): Payer: Self-pay

## 2012-04-13 DIAGNOSIS — R109 Unspecified abdominal pain: Secondary | ICD-10-CM | POA: Insufficient documentation

## 2012-04-13 DIAGNOSIS — F329 Major depressive disorder, single episode, unspecified: Secondary | ICD-10-CM | POA: Insufficient documentation

## 2012-04-13 DIAGNOSIS — K589 Irritable bowel syndrome without diarrhea: Secondary | ICD-10-CM | POA: Insufficient documentation

## 2012-04-13 DIAGNOSIS — Z79899 Other long term (current) drug therapy: Secondary | ICD-10-CM | POA: Insufficient documentation

## 2012-04-13 DIAGNOSIS — K219 Gastro-esophageal reflux disease without esophagitis: Secondary | ICD-10-CM | POA: Insufficient documentation

## 2012-04-13 DIAGNOSIS — F17200 Nicotine dependence, unspecified, uncomplicated: Secondary | ICD-10-CM | POA: Insufficient documentation

## 2012-04-13 DIAGNOSIS — Z8719 Personal history of other diseases of the digestive system: Secondary | ICD-10-CM | POA: Insufficient documentation

## 2012-04-13 DIAGNOSIS — Z9889 Other specified postprocedural states: Secondary | ICD-10-CM | POA: Insufficient documentation

## 2012-04-13 DIAGNOSIS — I10 Essential (primary) hypertension: Secondary | ICD-10-CM | POA: Insufficient documentation

## 2012-04-13 DIAGNOSIS — F3289 Other specified depressive episodes: Secondary | ICD-10-CM | POA: Insufficient documentation

## 2012-04-13 LAB — CBC WITH DIFFERENTIAL/PLATELET
Basophils Absolute: 0 10*3/uL (ref 0.0–0.1)
Basophils Relative: 0 % (ref 0–1)
Eosinophils Absolute: 0.1 10*3/uL (ref 0.0–0.7)
MCH: 30.3 pg (ref 26.0–34.0)
MCHC: 34 g/dL (ref 30.0–36.0)
Monocytes Relative: 4 % (ref 3–12)
Neutrophils Relative %: 68 % (ref 43–77)
Platelets: 438 10*3/uL — ABNORMAL HIGH (ref 150–400)
RDW: 15.1 % (ref 11.5–15.5)

## 2012-04-13 LAB — URINE MICROSCOPIC-ADD ON

## 2012-04-13 LAB — COMPREHENSIVE METABOLIC PANEL
Albumin: 3.8 g/dL (ref 3.5–5.2)
Alkaline Phosphatase: 63 U/L (ref 39–117)
BUN: 8 mg/dL (ref 6–23)
Potassium: 3.2 mEq/L — ABNORMAL LOW (ref 3.5–5.1)
Total Protein: 7.5 g/dL (ref 6.0–8.3)

## 2012-04-13 LAB — URINALYSIS, ROUTINE W REFLEX MICROSCOPIC
Bilirubin Urine: NEGATIVE
Ketones, ur: NEGATIVE mg/dL
Nitrite: NEGATIVE
Protein, ur: NEGATIVE mg/dL
Urobilinogen, UA: 0.2 mg/dL (ref 0.0–1.0)

## 2012-04-13 LAB — PREGNANCY, URINE: Preg Test, Ur: NEGATIVE

## 2012-04-13 LAB — LIPASE, BLOOD: Lipase: 26 U/L (ref 11–59)

## 2012-04-13 MED ORDER — POTASSIUM CHLORIDE 20 MEQ/15ML (10%) PO LIQD
40.0000 meq | Freq: Once | ORAL | Status: AC
  Administered 2012-04-13: 40 meq via ORAL
  Filled 2012-04-13: qty 30

## 2012-04-13 MED ORDER — ONDANSETRON HCL 4 MG PO TABS: 4.0000 mg | ORAL_TABLET | Freq: Three times a day (TID) | ORAL | Status: DC | PRN

## 2012-04-13 MED ORDER — DICYCLOMINE HCL 10 MG/ML IM SOLN
20.0000 mg | Freq: Once | INTRAMUSCULAR | Status: AC
  Administered 2012-04-13: 20 mg via INTRAMUSCULAR
  Filled 2012-04-13: qty 2

## 2012-04-13 MED ORDER — MORPHINE SULFATE 4 MG/ML IJ SOLN
4.0000 mg | Freq: Once | INTRAMUSCULAR | Status: AC
  Administered 2012-04-13: 4 mg via INTRAVENOUS
  Filled 2012-04-13: qty 1

## 2012-04-13 MED ORDER — ONDANSETRON HCL 4 MG/2ML IJ SOLN
4.0000 mg | INTRAMUSCULAR | Status: DC | PRN
  Administered 2012-04-13: 4 mg via INTRAVENOUS
  Filled 2012-04-13: qty 2

## 2012-04-13 MED ORDER — GI COCKTAIL ~~LOC~~
30.0000 mL | Freq: Once | ORAL | Status: AC
  Administered 2012-04-13: 30 mL via ORAL
  Filled 2012-04-13: qty 30

## 2012-04-13 MED ORDER — MORPHINE SULFATE 4 MG/ML IJ SOLN
4.0000 mg | INTRAMUSCULAR | Status: AC | PRN
  Administered 2012-04-13 (×2): 4 mg via INTRAVENOUS
  Filled 2012-04-13 (×2): qty 1

## 2012-04-13 MED ORDER — DICYCLOMINE HCL 20 MG PO TABS: 20.0000 mg | ORAL_TABLET | Freq: Four times a day (QID) | ORAL | Status: DC | PRN

## 2012-04-13 MED ORDER — FAMOTIDINE IN NACL 20-0.9 MG/50ML-% IV SOLN
20.0000 mg | Freq: Once | INTRAVENOUS | Status: AC
  Administered 2012-04-13: 20 mg via INTRAVENOUS
  Filled 2012-04-13: qty 50

## 2012-04-13 MED ORDER — SODIUM CHLORIDE 0.9 % IV SOLN
INTRAVENOUS | Status: DC
  Administered 2012-04-13: 1000 mL via INTRAVENOUS

## 2012-04-13 NOTE — ED Notes (Signed)
LKG:MW10<UV> Expected date:<BR> Expected time:<BR> Means of arrival:<BR> Comments:<BR> Ems: dizziness, acid reflux

## 2012-04-13 NOTE — ED Provider Notes (Signed)
History     CSN: [id number]  Arrival date & time 04/13/12  0143   First MD Initiated Contact with Patient 04/13/12 0206      Chief Complaint  Patient presents with  . Dizziness  . Gastrophageal Reflux     HPI Pt was seen at 0235.   Per pt, c/o gradual onset and persistence of constant generalized abd "pain" for the past 3 days.  Has been associated with multiple intermittent episodes of N/V.  Describes the abd pain as "cramping."  States her symptoms worsen after eating.  Has been able to tol PO fluids.  Describes her symptoms as "my reflux."  Denies diarrhea, no fevers, no back pain, no rash, no CP/SOB, no black or blood in stools or emesis.      Past Medical History  Diagnosis Date  . History of cocaine abuse 09/28/2010  . Hypertension   . GERD (gastroesophageal reflux disease)   . IBS (irritable bowel syndrome)   . Depression   . Mood swings     Past Surgical History  Procedure Date  . Appendectomy   . Cholecystectomy   . Tubal ligation   . Tonsillectomy     Family History  Problem Relation Age of Onset  . Hypertension Mother   . Heart disease Father 7    MI  . Stroke Father 27  . Diabetes Father   . GER disease Sister   . Cancer Maternal Grandmother     uterine and stomach    History  Substance Use Topics  . Smoking status: Current Every Day Smoker -- 0.3 packs/day    Types: Cigarettes  . Smokeless tobacco: Not on file  . Alcohol Use: No    Review of Systems ROS: Statement: All systems negative except as marked or noted in the HPI; Constitutional: Negative for fever and chills. ; ; Eyes: Negative for eye pain, redness and discharge. ; ; ENMT: Negative for ear pain, hoarseness, nasal congestion, sinus pressure and sore throat. ; ; Cardiovascular: Negative for chest pain, palpitations, diaphoresis, dyspnea and peripheral edema. ; ; Respiratory: Negative for cough, wheezing and stridor. ; ; Gastrointestinal: +N/V, abd pain. Negative for diarrhea, blood in  stool, hematemesis, jaundice and rectal bleeding. . ; ; Genitourinary: Negative for dysuria, flank pain and hematuria. ; ; Musculoskeletal: Negative for back pain and neck pain. Negative for swelling and trauma.; ; Skin: Negative for pruritus, rash, abrasions, blisters, bruising and skin lesion.; ; Neuro: Negative for headache, lightheadedness and neck stiffness. Negative for weakness, altered level of consciousness , altered mental status, extremity weakness, paresthesias, involuntary movement, seizure and syncope.      Allergies  Hydrocodone  Home Medications   Current Outpatient Rx  Name  Route  Sig  Dispense  Refill  . OMEPRAZOLE 20 MG PO CPDR   Oral   Take 1 capsule (20 mg total) by mouth daily.   30 capsule   5   . PAROXETINE HCL 20 MG PO TABS   Oral   Take 1 tablet (20 mg total) by mouth every morning.   30 tablet   1   . HYDROCHLOROTHIAZIDE 25 MG PO TABS   Oral   Take 25 mg by mouth daily.           BP 137/92  Pulse 74  Temp 99.2 F (37.3 C) (Oral)  Resp 18  Ht 5\' 1"  (1.549 m)  Wt 170 lb (77.111 kg)  BMI 32.12 kg/m2  SpO2 100%  LMP 03/30/2012  Physical Exam 0240: Physical examination:  Nursing notes reviewed; Vital signs and O2 SAT reviewed;  Constitutional: Well developed, Well nourished, Well hydrated, In no acute distress; Head:  Normocephalic, atraumatic; Eyes: EOMI, PERRL, No scleral icterus; ENMT: Mouth and pharynx normal, Mucous membranes moist; Neck: Supple, Full range of motion, No lymphadenopathy; Cardiovascular: Regular rate and rhythm, No murmur, rub, or gallop; Respiratory: Breath sounds clear & equal bilaterally, No rales, rhonchi, wheezes.  Speaking full sentences with ease, Normal respiratory effort/excursion; Chest: Nontender, Movement normal; Abdomen: Soft, +mild mid-epigastric tenderness to palp. No rebound or guarding. Nondistended, Normal bowel sounds; Genitourinary: No CVA tenderness; Extremities: Pulses normal, No tenderness, No edema, No  calf edema or asymmetry.; Neuro: AA&Ox3, Major CN grossly intact.  Speech clear. No gross focal motor or sensory deficits in extremities.; Skin: Color normal, Warm, Dry.   ED Course  Procedures    MDM  MDM Reviewed: previous chart, nursing note and vitals Interpretation: labs and x-ray   Results for orders placed during the hospital encounter of 04/13/12  PREGNANCY, URINE      Component Value Range   Preg Test, Ur NEGATIVE  NEGATIVE  URINALYSIS, ROUTINE W REFLEX MICROSCOPIC      Component Value Range   Color, Urine YELLOW  YELLOW   APPearance CLOUDY (*) CLEAR   Specific Gravity, Urine 1.024  1.005 - 1.030   pH 7.0  5.0 - 8.0   Glucose, UA NEGATIVE  NEGATIVE mg/dL   Hgb urine dipstick NEGATIVE  NEGATIVE   Bilirubin Urine NEGATIVE  NEGATIVE   Ketones, ur NEGATIVE  NEGATIVE mg/dL   Protein, ur NEGATIVE  NEGATIVE mg/dL   Urobilinogen, UA 0.2  0.0 - 1.0 mg/dL   Nitrite NEGATIVE  NEGATIVE   Leukocytes, UA SMALL (*) NEGATIVE  CBC WITH DIFFERENTIAL      Component Value Range   WBC 11.4 (*) 4.0 - 10.5 K/uL   RBC 4.02  3.87 - 5.11 MIL/uL   Hemoglobin 12.2  12.0 - 15.0 g/dL   HCT 16.1 (*) 09.6 - 04.5 %   MCV 89.3  78.0 - 100.0 fL   MCH 30.3  26.0 - 34.0 pg   MCHC 34.0  30.0 - 36.0 g/dL   RDW 40.9  81.1 - 91.4 %   Platelets 438 (*) 150 - 400 K/uL   Neutrophils Relative 68  43 - 77 %   Neutro Abs 7.7  1.7 - 7.7 K/uL   Lymphocytes Relative 27  12 - 46 %   Lymphs Abs 3.1  0.7 - 4.0 K/uL   Monocytes Relative 4  3 - 12 %   Monocytes Absolute 0.5  0.1 - 1.0 K/uL   Eosinophils Relative 1  0 - 5 %   Eosinophils Absolute 0.1  0.0 - 0.7 K/uL   Basophils Relative 0  0 - 1 %   Basophils Absolute 0.0  0.0 - 0.1 K/uL  COMPREHENSIVE METABOLIC PANEL      Component Value Range   Sodium 133 (*) 135 - 145 mEq/L   Potassium 3.2 (*) 3.5 - 5.1 mEq/L   Chloride 97  96 - 112 mEq/L   CO2 25  19 - 32 mEq/L   Glucose, Bld 86  70 - 99 mg/dL   BUN 8  6 - 23 mg/dL   Creatinine, Ser 7.82  0.50 -  1.10 mg/dL   Calcium 9.2  8.4 - 95.6 mg/dL   Total Protein 7.5  6.0 - 8.3 g/dL   Albumin 3.8  3.5 -  5.2 g/dL   AST 16  0 - 37 U/L   ALT 9  0 - 35 U/L   Alkaline Phosphatase 63  39 - 117 U/L   Total Bilirubin 0.2 (*) 0.3 - 1.2 mg/dL   GFR calc non Af Amer >90  >90 mL/min   GFR calc Af Amer >90  >90 mL/min  LIPASE, BLOOD      Component Value Range   Lipase 26  11 - 59 U/L  URINE MICROSCOPIC-ADD ON      Component Value Range   Squamous Epithelial / LPF FEW (*) RARE   WBC, UA 3-6  <3 WBC/hpf   Bacteria, UA RARE  RARE    Dg Abd Acute W/chest 04/13/2012  *RADIOLOGY REPORT*  Clinical Data: Abdominal pain and nausea.  ACUTE ABDOMEN SERIES (ABDOMEN 2 VIEW & CHEST 1 VIEW)  Comparison: Chest 03/02/2012.  Chest and abdomen 07/05/2010.  Findings: Normal heart size and pulmonary vascularity.  No focal consolidation in the lungs.  No blunting of costophrenic angles. No pneumothorax.  Mediastinal contours appear intact.  No significant change since previous study.  Scattered gas and stool in the colon.  No small or large bowel dilatation.  No free intra-abdominal air.  No abnormal air fluid levels.  No radiopaque stones. Calcified phleboliths in the pelvis. Surgical clips in the right upper quadrant.  IMPRESSION: No evidence of active pulmonary disease.  Nonobstructive bowel gas pattern.   Original Report Authenticated By: Burman Nieves, M.D.     0600:  Pt has tol PO well while in the ED without N/V.  No stooling while in the ED.  Abd remains benign, VSS. Wants to go home now but is requesting "just one more dose of pain medicine" before she is d/c.  Will dose.  Potassium repleted PO.  Dx and testing d/w pt.  Questions answered.  Verb understanding, agreeable to d/c home with outpt f/u.           Laray Anger, DO 04/15/12 [phone]

## 2012-04-13 NOTE — ED Notes (Signed)
Pt reports that her spasms have eased up and believes that one last dose of pain medicine will give her adequate pain control.  MD made aware.

## 2012-04-13 NOTE — ED Notes (Signed)
Per EMS report: pt from home: Pt c/o symptoms x 3 days.  Symptoms has worsen tonight.  Pt has N/V and vomited about 3 hours ago.  Pt hx of GERD, IBS, HTN.  Pt has only taken Goody's powder with no relief.  Rates her epigastric pain a 10/10.  Epigastric pain radiates to her side.  Has not really been able to eat for the past 3 days but her symptoms worsen after eating. Pt reports being able to tolerate water.  BP: 162/100, HR: 100, RR: 18. CBG: 84

## 2012-05-15 ENCOUNTER — Emergency Department (HOSPITAL_COMMUNITY)
Admission: EM | Admit: 2012-05-15 | Discharge: 2012-05-16 | Disposition: A | Discharge status: Home / Self Care | Payer: Self-pay | Attending: Emergency Medicine | Admitting: Emergency Medicine

## 2012-05-15 DIAGNOSIS — R079 Chest pain, unspecified: Secondary | ICD-10-CM | POA: Insufficient documentation

## 2012-05-15 DIAGNOSIS — I10 Essential (primary) hypertension: Secondary | ICD-10-CM | POA: Insufficient documentation

## 2012-05-15 DIAGNOSIS — Z9089 Acquired absence of other organs: Secondary | ICD-10-CM | POA: Insufficient documentation

## 2012-05-15 DIAGNOSIS — Z9851 Tubal ligation status: Secondary | ICD-10-CM | POA: Insufficient documentation

## 2012-05-15 DIAGNOSIS — R112 Nausea with vomiting, unspecified: Secondary | ICD-10-CM | POA: Insufficient documentation

## 2012-05-15 DIAGNOSIS — K5289 Other specified noninfective gastroenteritis and colitis: Secondary | ICD-10-CM | POA: Insufficient documentation

## 2012-05-15 DIAGNOSIS — F17200 Nicotine dependence, unspecified, uncomplicated: Secondary | ICD-10-CM | POA: Insufficient documentation

## 2012-05-15 DIAGNOSIS — F329 Major depressive disorder, single episode, unspecified: Secondary | ICD-10-CM | POA: Insufficient documentation

## 2012-05-15 DIAGNOSIS — K589 Irritable bowel syndrome without diarrhea: Secondary | ICD-10-CM | POA: Insufficient documentation

## 2012-05-15 DIAGNOSIS — F3289 Other specified depressive episodes: Secondary | ICD-10-CM | POA: Insufficient documentation

## 2012-05-15 DIAGNOSIS — K219 Gastro-esophageal reflux disease without esophagitis: Secondary | ICD-10-CM

## 2012-05-15 DIAGNOSIS — Z79899 Other long term (current) drug therapy: Secondary | ICD-10-CM | POA: Insufficient documentation

## 2012-05-16 ENCOUNTER — Telehealth (HOSPITAL_COMMUNITY): Payer: Self-pay | Admitting: Emergency Medicine

## 2012-05-16 ENCOUNTER — Encounter (HOSPITAL_COMMUNITY): Payer: Self-pay | Admitting: *Deleted

## 2012-05-16 ENCOUNTER — Emergency Department (HOSPITAL_COMMUNITY): Payer: Self-pay

## 2012-05-16 LAB — CBC WITH DIFFERENTIAL/PLATELET
Basophils Absolute: 0 10*3/uL (ref 0.0–0.1)
Basophils Relative: 0 % (ref 0–1)
Eosinophils Absolute: 0.1 10*3/uL (ref 0.0–0.7)
MCH: 30.2 pg (ref 26.0–34.0)
MCHC: 34 g/dL (ref 30.0–36.0)
Monocytes Absolute: 0.4 10*3/uL (ref 0.1–1.0)
Neutro Abs: 10 10*3/uL — ABNORMAL HIGH (ref 1.7–7.7)
Neutrophils Relative %: 78 % — ABNORMAL HIGH (ref 43–77)
RDW: 15.3 % (ref 11.5–15.5)

## 2012-05-16 LAB — COMPREHENSIVE METABOLIC PANEL
AST: 15 U/L (ref 0–37)
Albumin: 3.9 g/dL (ref 3.5–5.2)
BUN: 9 mg/dL (ref 6–23)
Chloride: 101 mEq/L (ref 96–112)
Creatinine, Ser: 0.63 mg/dL (ref 0.50–1.10)
Total Bilirubin: 0.2 mg/dL — ABNORMAL LOW (ref 0.3–1.2)
Total Protein: 7.8 g/dL (ref 6.0–8.3)

## 2012-05-16 LAB — POCT I-STAT TROPONIN I: Troponin i, poc: 0 ng/mL (ref 0.00–0.08)

## 2012-05-16 MED ORDER — HYDROMORPHONE HCL PF 2 MG/ML IJ SOLN
2.0000 mg | Freq: Once | INTRAMUSCULAR | Status: AC
  Administered 2012-05-16: 2 mg via INTRAVENOUS
  Filled 2012-05-16: qty 1

## 2012-05-16 MED ORDER — GI COCKTAIL ~~LOC~~
30.0000 mL | Freq: Once | ORAL | Status: AC
  Administered 2012-05-16: 30 mL via ORAL
  Filled 2012-05-16: qty 30

## 2012-05-16 MED ORDER — OXYCODONE-ACETAMINOPHEN 5-325 MG PO TABS: 2.0000 | ORAL_TABLET | ORAL | Status: DC | PRN

## 2012-05-16 MED ORDER — HYDROMORPHONE HCL PF 1 MG/ML IJ SOLN
1.0000 mg | Freq: Once | INTRAMUSCULAR | Status: AC
  Administered 2012-05-16: 1 mg via INTRAVENOUS
  Filled 2012-05-16: qty 1

## 2012-05-16 MED ORDER — SUCRALFATE 1 G PO TABS: 1.0000 g | ORAL_TABLET | Freq: Four times a day (QID) | ORAL | Status: DC

## 2012-05-16 MED ORDER — PROMETHAZINE HCL 25 MG/ML IJ SOLN
25.0000 mg | Freq: Once | INTRAMUSCULAR | Status: AC
  Administered 2012-05-16: 25 mg via INTRAVENOUS
  Filled 2012-05-16: qty 1

## 2012-05-16 MED ORDER — PROMETHAZINE HCL 25 MG PO TABS: 25.0000 mg | ORAL_TABLET | Freq: Four times a day (QID) | ORAL | Status: DC | PRN

## 2012-05-16 MED ORDER — ONDANSETRON HCL 4 MG/2ML IJ SOLN
4.0000 mg | Freq: Once | INTRAMUSCULAR | Status: AC
  Administered 2012-05-16: 4 mg via INTRAVENOUS
  Filled 2012-05-16: qty 2

## 2012-05-16 MED ORDER — SODIUM CHLORIDE 0.9 % IV SOLN
1000.0000 mL | Freq: Once | INTRAVENOUS | Status: AC
  Administered 2012-05-16: 1000 mL via INTRAVENOUS

## 2012-05-16 MED ORDER — PANTOPRAZOLE SODIUM 40 MG IV SOLR
40.0000 mg | Freq: Once | INTRAVENOUS | Status: AC
  Administered 2012-05-16: 40 mg via INTRAVENOUS
  Filled 2012-05-16: qty 40

## 2012-05-16 NOTE — ED Notes (Signed)
Pt c/o GERD exacerbation starting x 3 days ago, chest pain starting 2 days ago and worsening sxs that are the same, today. Pt vomiting x 1 hr intermittently.

## 2012-05-16 NOTE — ED Provider Notes (Signed)
History     CSN: [id number]  Arrival date & time 05/15/12  2320   First MD Initiated Contact with Patient 05/16/12 0113      Chief Complaint  Patient presents with  . Abdominal Pain    (Consider location/radiation/quality/duration/timing/severity/associated sxs/prior treatment) HPI Comments: Patient is a 37 year old female with a past medical history of GERD who presents with abdominal pain that started 2 days ago. The pain is located in her epigastrium and radiates up into her central chest. The pain is described as burning and severe. The pain started gradually and progressively worsened since the onset. No alleviating/aggravating factors. The patient has tried tums and tylenol for symptoms without relief. Associated symptoms include chest pain, nausea and vomiting. Patient denies fever, headache, diarrhea, chest pain, SOB, dysuria, constipation, abnormal vaginal bleeding/discharge.      Past Medical History  Diagnosis Date  . History of cocaine abuse 09/28/2010  . Hypertension   . GERD (gastroesophageal reflux disease)   . IBS (irritable bowel syndrome)   . Depression   . Mood swings     Past Surgical History  Procedure Laterality Date  . Appendectomy    . Cholecystectomy    . Tubal ligation    . Tonsillectomy      Family History  Problem Relation Age of Onset  . Hypertension Mother   . Heart disease Father 69    MI  . Stroke Father 6  . Diabetes Father   . GER disease Sister   . Cancer Maternal Grandmother     uterine and stomach    History  Substance Use Topics  . Smoking status: Current Every Day Smoker -- 0.30 packs/day    Types: Cigarettes  . Smokeless tobacco: Not on file  . Alcohol Use: No    OB History   Grav Para Term Preterm Abortions TAB SAB Ect Mult Living                  Review of Systems  Cardiovascular: Positive for chest pain.  Gastrointestinal: Positive for nausea, vomiting and abdominal pain.  All other systems reviewed and are  negative.    Allergies  Hydrocodone  Home Medications   Current Outpatient Rx  Name  Route  Sig  Dispense  Refill  . Belladonna Alk-Phenobarbital (BELLADONNA ALK-PHENOBARB ER PO)   Oral   Take 2 tablets by mouth every morning.         . calcium carbonate (TUMS - DOSED IN MG ELEMENTAL CALCIUM) 500 MG chewable tablet   Oral   Chew 2 tablets by mouth 2 (two) times daily as needed for heartburn.         . dicyclomine (BENTYL) 20 MG tablet   Oral   Take 1 tablet (20 mg total) by mouth every 6 (six) hours as needed (abdominal cramping).   15 tablet   0   . hydrochlorothiazide (HYDRODIURIL) 25 MG tablet   Oral   Take 25 mg by mouth every morning.         Marland Kitchen omeprazole (PRILOSEC) 20 MG capsule   Oral   Take 20 mg by mouth every morning.         . ondansetron (ZOFRAN) 4 MG tablet   Oral   Take 1 tablet (4 mg total) by mouth every 8 (eight) hours as needed for nausea.   6 tablet   0   . oxyCODONE-acetaminophen (PERCOCET) 7.5-325 MG per tablet   Oral   Take 1 tablet by mouth  every 8 (eight) hours as needed for pain.         Marland Kitchen PARoxetine (PAXIL) 20 MG tablet   Oral   Take 1 tablet (20 mg total) by mouth every morning.   30 tablet   1     BP 126/99  Pulse 79  Temp(Src) 98.6 F (37 C) (Oral)  Resp 20  SpO2 100%  LMP 04/24/2012  Physical Exam  Nursing note and vitals reviewed. Constitutional: She is oriented to person, place, and time. She appears well-developed and well-nourished. No distress.  HENT:  Head: Normocephalic and atraumatic.  Mouth/Throat: Oropharynx is clear and moist. No oropharyngeal exudate.  Eyes: Conjunctivae and EOM are normal. Pupils are equal, round, and reactive to light.  Neck: Normal range of motion.  Cardiovascular: Normal rate and regular rhythm.  Exam reveals no gallop and no friction rub.   No murmur heard. Pulmonary/Chest: Effort normal and breath sounds normal. She has no wheezes. She has no rales. She exhibits no  tenderness.  Abdominal: Soft. She exhibits no distension. There is tenderness. There is no rebound and no guarding.  Epigastric tenderness to palpation.   Musculoskeletal: Normal range of motion.  Neurological: She is alert and oriented to person, place, and time. Coordination normal.  Speech is goal-oriented. Moves limbs without ataxia.   Skin: Skin is warm and dry.  Psychiatric: She has a normal mood and affect. Her behavior is normal.    ED Course  Procedures (including critical care time)   Date: 05/16/2012  Rate: 73  Rhythm: normal sinus rhythm  QRS Axis: normal  Intervals: normal  ST/T Wave abnormalities: nonspecific T wave changes  Conduction Disutrbances:none  Narrative Interpretation: NSR with nonspecific T wave changes, unchanged from previous  Old EKG Reviewed: unchanged    Labs Reviewed  CBC WITH DIFFERENTIAL - Abnormal; Notable for the following:    WBC 12.8 (*)    Platelets 470 (*)    Neutrophils Relative 78 (*)    Neutro Abs 10.0 (*)    All other components within normal limits  COMPREHENSIVE METABOLIC PANEL - Abnormal; Notable for the following:    Total Bilirubin 0.2 (*)    All other components within normal limits  URINALYSIS, MICROSCOPIC ONLY  POCT I-STAT TROPONIN I   Dg Chest 2 View  05/16/2012  *RADIOLOGY REPORT*  Clinical Data: Nausea, vomiting and abdominal pain; history of smoking.  CHEST - 2 VIEW  Comparison: Chest radiograph performed 04/13/2012  Findings: The lungs are well-aerated and clear.  There is no evidence of focal opacification, pleural effusion or pneumothorax.  The heart is normal in size; the mediastinal contour is within normal limits.  No acute osseous abnormalities are seen.   Clips are noted within the right upper quadrant, reflecting prior cholecystectomy.  IMPRESSION: No acute cardiopulmonary process seen.   Original Report Authenticated By: Tonia Ghent, M.D.      1. GERD (gastroesophageal reflux disease)       MDM  3:14  AM Patient given gi cocktail, protonix, dilaudid, and zofran. Labs unremarkable. Patient will have more dilaudid for pain control.   4:20 AM Troponin and chest xray unremarkable.   5:27 AM Patient reports some improvement. Patient will be discharged with carafate. I will recommend a GI doctor for the patient to follow up with. Vitals stable and patient afebrile. Patient instructed to return with worsening or concerning symptoms. No further evaluation needed at this time.    Emilia Beck, [place] 05/16/12 [phone]

## 2012-05-16 NOTE — ED Notes (Signed)
Pt aware urine sample needed 

## 2012-05-16 NOTE — ED Provider Notes (Signed)
Medical screening examination/treatment/procedure(s) were performed by non-physician practitioner and as supervising physician I was immediately available for consultation/collaboration.  Olga M Otter, MD 05/16/12 0555 

## 2012-05-16 NOTE — ED Notes (Signed)
Pt stated that she is not able to urinate.   Will try again in 30 minutes. 

## 2012-07-27 ENCOUNTER — Emergency Department (HOSPITAL_COMMUNITY)
Admission: EM | Admit: 2012-07-27 | Discharge: 2012-07-27 | Disposition: A | Discharge status: Home / Self Care | Payer: Self-pay | Attending: Emergency Medicine | Admitting: Emergency Medicine

## 2012-07-27 DIAGNOSIS — Z8659 Personal history of other mental and behavioral disorders: Secondary | ICD-10-CM | POA: Insufficient documentation

## 2012-07-27 DIAGNOSIS — F17200 Nicotine dependence, unspecified, uncomplicated: Secondary | ICD-10-CM | POA: Insufficient documentation

## 2012-07-27 DIAGNOSIS — R112 Nausea with vomiting, unspecified: Secondary | ICD-10-CM | POA: Insufficient documentation

## 2012-07-27 DIAGNOSIS — Z79899 Other long term (current) drug therapy: Secondary | ICD-10-CM | POA: Insufficient documentation

## 2012-07-27 DIAGNOSIS — R109 Unspecified abdominal pain: Secondary | ICD-10-CM | POA: Insufficient documentation

## 2012-07-27 DIAGNOSIS — Z8719 Personal history of other diseases of the digestive system: Secondary | ICD-10-CM | POA: Insufficient documentation

## 2012-07-27 DIAGNOSIS — R630 Anorexia: Secondary | ICD-10-CM | POA: Insufficient documentation

## 2012-07-27 DIAGNOSIS — K219 Gastro-esophageal reflux disease without esophagitis: Secondary | ICD-10-CM | POA: Insufficient documentation

## 2012-07-27 DIAGNOSIS — I10 Essential (primary) hypertension: Secondary | ICD-10-CM | POA: Insufficient documentation

## 2012-07-27 LAB — CBC WITH DIFFERENTIAL/PLATELET
Basophils Relative: 0 % (ref 0–1)
HCT: 36.2 % (ref 36.0–46.0)
Hemoglobin: 11.9 g/dL — ABNORMAL LOW (ref 12.0–15.0)
Lymphocytes Relative: 28 % (ref 12–46)
Lymphs Abs: 2.7 10*3/uL (ref 0.7–4.0)
MCHC: 32.9 g/dL (ref 30.0–36.0)
Monocytes Absolute: 0.5 10*3/uL (ref 0.1–1.0)
Monocytes Relative: 5 % (ref 3–12)
Neutro Abs: 6.1 10*3/uL (ref 1.7–7.7)
Neutrophils Relative %: 65 % (ref 43–77)
RBC: 4.12 MIL/uL (ref 3.87–5.11)
WBC: 9.4 10*3/uL (ref 4.0–10.5)

## 2012-07-27 LAB — COMPREHENSIVE METABOLIC PANEL
Albumin: 3.8 g/dL (ref 3.5–5.2)
Alkaline Phosphatase: 63 U/L (ref 39–117)
BUN: 7 mg/dL (ref 6–23)
CO2: 25 mEq/L (ref 19–32)
Chloride: 99 mEq/L (ref 96–112)
Creatinine, Ser: 0.67 mg/dL (ref 0.50–1.10)
GFR calc non Af Amer: >90 mL/min (ref >90)
Glucose, Bld: 84 mg/dL (ref 70–99)
Potassium: 3.3 mEq/L — ABNORMAL LOW (ref 3.5–5.1)
Total Bilirubin: 0.2 mg/dL — ABNORMAL LOW (ref 0.3–1.2)

## 2012-07-27 LAB — LIPASE, BLOOD: Lipase: 23 U/L (ref 11–59)

## 2012-07-27 MED ORDER — SUCRALFATE 1 G PO TABS: 1.0000 g | ORAL_TABLET | Freq: Four times a day (QID) | ORAL | Status: DC

## 2012-07-27 MED ORDER — OXYCODONE-ACETAMINOPHEN 5-325 MG PO TABS: 1.0000 | ORAL_TABLET | Freq: Four times a day (QID) | ORAL | Status: DC | PRN

## 2012-07-27 MED ORDER — MORPHINE SULFATE 4 MG/ML IJ SOLN
4.0000 mg | Freq: Once | INTRAMUSCULAR | Status: AC
  Administered 2012-07-27: 4 mg via INTRAVENOUS
  Filled 2012-07-27: qty 1

## 2012-07-27 MED ORDER — OXYCODONE-ACETAMINOPHEN 5-325 MG PO TABS
2.0000 | ORAL_TABLET | Freq: Once | ORAL | Status: AC
  Administered 2012-07-27: 2 via ORAL
  Filled 2012-07-27: qty 2

## 2012-07-27 MED ORDER — ONDANSETRON HCL 4 MG PO TABS: 4.0000 mg | ORAL_TABLET | Freq: Four times a day (QID) | ORAL | Status: DC

## 2012-07-27 MED ORDER — SODIUM CHLORIDE 0.9 % IV SOLN
1000.0000 mL | Freq: Once | INTRAVENOUS | Status: AC
  Administered 2012-07-27: 1000 mL via INTRAVENOUS

## 2012-07-27 MED ORDER — SUCRALFATE 1 G PO TABS
1.0000 g | ORAL_TABLET | Freq: Once | ORAL | Status: AC
  Administered 2012-07-27: 1 g via ORAL
  Filled 2012-07-27: qty 1

## 2012-07-27 MED ORDER — OMEPRAZOLE 20 MG PO CPDR: 40.0000 mg | DELAYED_RELEASE_CAPSULE | Freq: Every morning | ORAL | Status: DC

## 2012-07-27 MED ORDER — PANTOPRAZOLE SODIUM 40 MG IV SOLR
40.0000 mg | Freq: Once | INTRAVENOUS | Status: AC
  Administered 2012-07-27: 40 mg via INTRAVENOUS
  Filled 2012-07-27: qty 40

## 2012-07-27 MED ORDER — POTASSIUM CHLORIDE CRYS ER 20 MEQ PO TBCR
20.0000 meq | EXTENDED_RELEASE_TABLET | Freq: Once | ORAL | Status: AC
  Administered 2012-07-27: 20 meq via ORAL
  Filled 2012-07-27: qty 1

## 2012-07-27 MED ORDER — ONDANSETRON HCL 4 MG/2ML IJ SOLN
4.0000 mg | Freq: Once | INTRAMUSCULAR | Status: AC
  Administered 2012-07-27: 4 mg via INTRAVENOUS
  Filled 2012-07-27: qty 2

## 2012-07-27 NOTE — ED Notes (Signed)
[id number]<AV> Expected date:<BR> Expected time:<BR> Means of arrival:<BR> Comments:<BR> EMS, GI Upset

## 2012-07-27 NOTE — Discharge Instructions (Signed)
Please make an appointment with your primary care physician to again discuss a referral to a specialist, so that she can have an endoscopy to further visualize, your esophagus, and upper stomach.  You have been given prescriptions for pain management, Carafate, that we'll culture stomach, as well as an increased dosage of Prilosec from 20 mg to 40 mg daily

## 2012-07-27 NOTE — ED Provider Notes (Signed)
Medical screening examination/treatment/procedure(s) were performed by non-physician practitioner and as supervising physician I was immediately available for consultation/collaboration.  Jones Skene, M.D.   Jones Skene, MD 07/27/12 [phone]

## 2012-07-27 NOTE — ED Provider Notes (Signed)
History     CSN: [id number]  Arrival date & time 07/27/12  0110   First MD Initiated Contact with Patient 07/27/12 0217      Chief Complaint  Patient presents with  . Gastrophageal Reflux    (Consider location/radiation/quality/duration/timing/severity/associated sxs/prior treatment) HPI Comments: Patient with a history of gastric reflux, disease.  States, that over the last week.  Her pain has become much worse.  She, states she's been taking her Prilosec without relief.  She's had persistent nausea, vomiting, and pain.  She reports, that her abdomen becomes distended in the right upper quadrant, painful, or tender, having or rubbing tends to make it slightly better, but never dissipates.  She has been seen by her primary care Dr. or Dr. Sherwood Gambler but has never seen a gastroenterologist.  She does have a history of having had an appendectomy, and a cholecystectomy.  She denies any constipation, although she carries an diagnosis of IBS, and cocaine abuse  Patient is a 37 y.o. female presenting with GERD. The history is provided by the patient.  Gastrophageal Reflux This is a recurrent problem. The current episode started in the past 7 days. The problem occurs constantly. The problem has been gradually worsening. Associated symptoms include abdominal pain, nausea and vomiting. Pertinent negatives include no chest pain, chills, fever, headaches, rash or urinary symptoms. The symptoms are aggravated by eating, drinking and stress. The treatment provided no relief.    Past Medical History  Diagnosis Date  . History of cocaine abuse 09/28/2010  . Hypertension   . GERD (gastroesophageal reflux disease)   . IBS (irritable bowel syndrome)   . Depression   . Mood swings     Past Surgical History  Procedure Laterality Date  . Appendectomy    . Cholecystectomy    . Tubal ligation    . Tonsillectomy      Family History  Problem Relation Age of Onset  . Hypertension Mother   . Heart disease  Father 19    MI  . Stroke Father 46  . Diabetes Father   . GER disease Sister   . Cancer Maternal Grandmother     uterine and stomach    History  Substance Use Topics  . Smoking status: Current Every Day Smoker -- 0.30 packs/day    Types: Cigarettes  . Smokeless tobacco: Not on file  . Alcohol Use: No    OB History   Grav Para Term Preterm Abortions TAB SAB Ect Mult Living                  Review of Systems  Constitutional: Positive for appetite change. Negative for fever, chills and activity change.  Respiratory: Negative for shortness of breath.   Cardiovascular: Negative for chest pain, palpitations and leg swelling.  Gastrointestinal: Positive for nausea, vomiting and abdominal pain. Negative for diarrhea, constipation, blood in stool and rectal pain.  Endocrine: Negative for polydipsia, polyphagia and polyuria.  Genitourinary: Negative for dysuria.  Skin: Negative for rash and wound.  Neurological: Negative for dizziness and headaches.  All other systems reviewed and are negative.    Allergies  Hydrocodone  Home Medications   Current Outpatient Rx  Name  Route  Sig  Dispense  Refill  . Belladonna Alk-Phenobarbital (BELLADONNA ALK-PHENOBARB ER PO)   Oral   Take 2 tablets by mouth every morning.         . calcium carbonate (TUMS - DOSED IN MG ELEMENTAL CALCIUM) 500 MG chewable tablet   Oral  Chew 2 tablets by mouth 2 (two) times daily as needed for heartburn.         . dicyclomine (BENTYL) 20 MG tablet   Oral   Take 1 tablet (20 mg total) by mouth every 6 (six) hours as needed (abdominal cramping).   15 tablet   0   . hydrochlorothiazide (HYDRODIURIL) 25 MG tablet   Oral   Take 25 mg by mouth every morning.         Marland Kitchen PARoxetine (PAXIL) 20 MG tablet   Oral   Take 1 tablet (20 mg total) by mouth every morning.   30 tablet   1   . omeprazole (PRILOSEC) 20 MG capsule   Oral   Take 2 capsules (40 mg total) by mouth every morning.   30  capsule   0   . ondansetron (ZOFRAN) 4 MG tablet   Oral   Take 1 tablet (4 mg total) by mouth every 6 (six) hours.   12 tablet   0   . oxyCODONE-acetaminophen (PERCOCET/ROXICET) 5-325 MG per tablet   Oral   Take 1 tablet by mouth every 6 (six) hours as needed for pain.   18 tablet   0   . sucralfate (CARAFATE) 1 G tablet   Oral   Take 1 tablet (1 g total) by mouth 4 (four) times daily.   40 tablet   0     BP 135/85  Pulse 74  Temp(Src) 98.6 F (37 C) (Oral)  Resp 20  SpO2 100%  Physical Exam  Nursing note and vitals reviewed. Constitutional: She is oriented to person, place, and time. She appears well-developed and well-nourished.  HENT:  Head: Normocephalic and atraumatic.  Eyes: Pupils are equal, round, and reactive to light.  Neck: Normal range of motion.  Cardiovascular: Normal rate and regular rhythm.   Pulmonary/Chest: Effort normal.  Abdominal: Soft. Bowel sounds are normal. She exhibits no distension. There is tenderness in the right upper quadrant and epigastric area. There is no rigidity, no rebound, no guarding and negative Murphy's sign.  Musculoskeletal: Normal range of motion.  Neurological: She is alert and oriented to person, place, and time.  Skin: Skin is warm.    ED Course  Procedures (including critical care time)  Labs Reviewed  CBC WITH DIFFERENTIAL - Abnormal; Notable for the following:    Hemoglobin 11.9 (*)    RDW 15.7 (*)    Platelets 488 (*)    All other components within normal limits  COMPREHENSIVE METABOLIC PANEL - Abnormal; Notable for the following:    Potassium 3.3 (*)    Total Bilirubin 0.2 (*)    All other components within normal limits  LIPASE, BLOOD  URINALYSIS, ROUTINE W REFLEX MICROSCOPIC  URINE RAPID DRUG SCREEN (HOSP PERFORMED)   No results found.   1. GERD (gastroesophageal reflux disease)       MDM   Patient's labs reviewed.  They're all within normal, limits.  She's been given IV hydration, Carafate,  morphine, Zofran, Protonix, and by mouth Percocet, and is feeling, better, she's had no more episodes of vomiting        Arman Filter, NP 07/27/12 [phone]

## 2013-07-09 ENCOUNTER — Encounter (HOSPITAL_COMMUNITY): Payer: Self-pay | Admitting: Emergency Medicine

## 2013-07-09 ENCOUNTER — Emergency Department (HOSPITAL_COMMUNITY)
Admission: EM | Admit: 2013-07-09 | Discharge: 2013-07-09 | Disposition: A | Discharge status: Home / Self Care | Payer: Self-pay | Attending: Emergency Medicine | Admitting: Emergency Medicine

## 2013-07-09 DIAGNOSIS — Z3202 Encounter for pregnancy test, result negative: Secondary | ICD-10-CM | POA: Insufficient documentation

## 2013-07-09 DIAGNOSIS — F17200 Nicotine dependence, unspecified, uncomplicated: Secondary | ICD-10-CM | POA: Insufficient documentation

## 2013-07-09 DIAGNOSIS — Z8659 Personal history of other mental and behavioral disorders: Secondary | ICD-10-CM | POA: Insufficient documentation

## 2013-07-09 DIAGNOSIS — N390 Urinary tract infection, site not specified: Secondary | ICD-10-CM | POA: Insufficient documentation

## 2013-07-09 DIAGNOSIS — Z8719 Personal history of other diseases of the digestive system: Secondary | ICD-10-CM | POA: Insufficient documentation

## 2013-07-09 DIAGNOSIS — R1013 Epigastric pain: Secondary | ICD-10-CM | POA: Insufficient documentation

## 2013-07-09 DIAGNOSIS — R109 Unspecified abdominal pain: Secondary | ICD-10-CM

## 2013-07-09 DIAGNOSIS — D649 Anemia, unspecified: Secondary | ICD-10-CM | POA: Insufficient documentation

## 2013-07-09 DIAGNOSIS — Z79899 Other long term (current) drug therapy: Secondary | ICD-10-CM | POA: Insufficient documentation

## 2013-07-09 DIAGNOSIS — I10 Essential (primary) hypertension: Secondary | ICD-10-CM | POA: Insufficient documentation

## 2013-07-09 LAB — COMPREHENSIVE METABOLIC PANEL
ALK PHOS: 59 U/L (ref 39–117)
ALT: 11 U/L (ref 0–35)
AST: 17 U/L (ref 0–37)
Albumin: 3.5 g/dL (ref 3.5–5.2)
BUN: 10 mg/dL (ref 6–23)
CALCIUM: 8.9 mg/dL (ref 8.4–10.5)
CO2: 23 meq/L (ref 19–32)
Chloride: 100 mEq/L (ref 96–112)
Creatinine, Ser: 0.57 mg/dL (ref 0.50–1.10)
GLUCOSE: 86 mg/dL (ref 70–99)
Potassium: 4 mEq/L (ref 3.7–5.3)
SODIUM: 136 meq/L — AB (ref 137–147)
Total Bilirubin: 0.2 mg/dL — ABNORMAL LOW (ref 0.3–1.2)
Total Protein: 7.2 g/dL (ref 6.0–8.3)

## 2013-07-09 LAB — I-STAT TROPONIN, ED: Troponin i, poc: 0 ng/mL (ref 0.00–0.08)

## 2013-07-09 LAB — URINE MICROSCOPIC-ADD ON

## 2013-07-09 LAB — CBC WITH DIFFERENTIAL/PLATELET
Basophils Absolute: 0 10*3/uL (ref 0.0–0.1)
Basophils Relative: 0 % (ref 0–1)
EOS PCT: 1 % (ref 0–5)
Eosinophils Absolute: 0.1 10*3/uL (ref 0.0–0.7)
HEMATOCRIT: 36.2 % (ref 36.0–46.0)
HEMOGLOBIN: 11.9 g/dL — AB (ref 12.0–15.0)
LYMPHS ABS: 2 10*3/uL (ref 0.7–4.0)
LYMPHS PCT: 21 % (ref 12–46)
MCH: 29.2 pg (ref 26.0–34.0)
MCHC: 32.9 g/dL (ref 30.0–36.0)
MCV: 88.7 fL (ref 78.0–100.0)
MONOS PCT: 6 % (ref 3–12)
Monocytes Absolute: 0.6 10*3/uL (ref 0.1–1.0)
Neutro Abs: 7 10*3/uL (ref 1.7–7.7)
Neutrophils Relative %: 72 % (ref 43–77)
PLATELETS: 381 10*3/uL (ref 150–400)
RBC: 4.08 MIL/uL (ref 3.87–5.11)
RDW: 16.5 % — ABNORMAL HIGH (ref 11.5–15.5)
WBC: 9.7 10*3/uL (ref 4.0–10.5)

## 2013-07-09 LAB — URINALYSIS, ROUTINE W REFLEX MICROSCOPIC
BILIRUBIN URINE: NEGATIVE
Glucose, UA: NEGATIVE mg/dL
HGB URINE DIPSTICK: NEGATIVE
Ketones, ur: NEGATIVE mg/dL
Nitrite: NEGATIVE
PROTEIN: NEGATIVE mg/dL
Specific Gravity, Urine: 1.024 (ref 1.005–1.030)
UROBILINOGEN UA: 1 mg/dL (ref 0.0–1.0)
pH: 6.5 (ref 5.0–8.0)

## 2013-07-09 LAB — POC URINE PREG, ED: Preg Test, Ur: NEGATIVE

## 2013-07-09 LAB — LIPASE, BLOOD: LIPASE: 19 U/L (ref 11–59)

## 2013-07-09 MED ORDER — FAMOTIDINE IN NACL 20-0.9 MG/50ML-% IV SOLN
20.0000 mg | Freq: Once | INTRAVENOUS | Status: AC
  Administered 2013-07-09: 20 mg via INTRAVENOUS
  Filled 2013-07-09: qty 50

## 2013-07-09 MED ORDER — SULFAMETHOXAZOLE-TMP DS 800-160 MG PO TABS: 1.0000 | ORAL_TABLET | Freq: Two times a day (BID) | ORAL | Status: DC

## 2013-07-09 MED ORDER — SODIUM CHLORIDE 0.9 % IV SOLN
1000.0000 mL | Freq: Once | INTRAVENOUS | Status: AC
  Administered 2013-07-09: 1000 mL via INTRAVENOUS

## 2013-07-09 MED ORDER — HYDROMORPHONE HCL PF 1 MG/ML IJ SOLN
1.0000 mg | Freq: Once | INTRAMUSCULAR | Status: AC
Start: 2013-07-09 — End: 2013-07-09
  Administered 2013-07-09: 1 mg via INTRAVENOUS
  Filled 2013-07-09: qty 1

## 2013-07-09 MED ORDER — DEXTROSE 5 % IV SOLN
1.0000 g | Freq: Once | INTRAVENOUS | Status: AC
  Administered 2013-07-09: 1 g via INTRAVENOUS
  Filled 2013-07-09: qty 10

## 2013-07-09 MED ORDER — MORPHINE SULFATE 4 MG/ML IJ SOLN
4.0000 mg | Freq: Once | INTRAMUSCULAR | Status: AC
  Administered 2013-07-09: 4 mg via INTRAVENOUS
  Filled 2013-07-09: qty 1

## 2013-07-09 MED ORDER — SODIUM CHLORIDE 0.9 % IV SOLN
1000.0000 mL | INTRAVENOUS | Status: DC
  Administered 2013-07-09: 1000 mL via INTRAVENOUS

## 2013-07-09 MED ORDER — OXYCODONE-ACETAMINOPHEN 5-325 MG PO TABS: 1.0000 | ORAL_TABLET | ORAL | Status: DC | PRN

## 2013-07-09 MED ORDER — GI COCKTAIL ~~LOC~~
30.0000 mL | Freq: Once | ORAL | Status: AC
  Administered 2013-07-09: 30 mL via ORAL
  Filled 2013-07-09: qty 30

## 2013-07-09 MED ORDER — ONDANSETRON HCL 4 MG/2ML IJ SOLN
4.0000 mg | Freq: Once | INTRAMUSCULAR | Status: AC
  Administered 2013-07-09: 4 mg via INTRAVENOUS
  Filled 2013-07-09: qty 2

## 2013-07-09 NOTE — Discharge Instructions (Signed)
Abdominal (belly) pain can be caused by many things. Your caregiver performed an examination and possibly ordered blood/urine tests and imaging (CT scan, x-rays, ultrasound). Many cases can be observed and treated at home after initial evaluation in the emergency department. Even though you are being discharged home, abdominal pain can be unpredictable. Therefore, you need a repeated exam if your pain does not resolve, returns, or worsens. Most patients with abdominal pain don't have to be admitted to the hospital or have surgery, but serious problems like appendicitis and gallbladder attacks can start out as nonspecific pain. Many abdominal conditions cannot be diagnosed in one visit, so follow-up evaluations are very important. SEEK IMMEDIATE MEDICAL ATTENTION IF: The pain does not go away or becomes severe.  A temperature above 101 devel Urinary Tract Infection Urinary tract infections (UTIs) can develop anywhere along your urinary tract. Your urinary tract is your body's drainage system for removing wastes and extra water. Your urinary tract includes two kidneys, two ureters, a bladder, and a urethra. Your kidneys are a pair of bean-shaped organs. Each kidney is about the size of your fist. They are located below your ribs, one on each side of your spine. CAUSES Infections are caused by microbes, which are microscopic organisms, including fungi, viruses, and bacteria. These organisms are so small that they can only be seen through a microscope. Bacteria are the microbes that most commonly cause UTIs. SYMPTOMS  Symptoms of UTIs may vary by age and gender of the patient and by the location of the infection. Symptoms in young women typically include a frequent and intense urge to urinate and a painful, burning feeling in the bladder or urethra during urination. Older women and men are more likely to be tired, shaky, and weak and have muscle aches and abdominal pain. A fever may mean the infection is in  your kidneys. Other symptoms of a kidney infection include pain in your back or sides below the ribs, nausea, and vomiting. DIAGNOSIS To diagnose a UTI, your caregiver will ask you about your symptoms. Your caregiver also will ask to provide a urine sample. The urine sample will be tested for bacteria and white blood cells. White blood cells are made by your body to help fight infection. TREATMENT  Typically, UTIs can be treated with medication. Because most UTIs are caused by a bacterial infection, they usually can be treated with the use of antibiotics. The choice of antibiotic and length of treatment depend on your symptoms and the type of bacteria causing your infection. HOME CARE INSTRUCTIONS  If you were prescribed antibiotics, take them exactly as your caregiver instructs you. Finish the medication even if you feel better after you have only taken some of the medication.  Drink enough water and fluids to keep your urine clear or pale yellow.  Avoid caffeine, tea, and carbonated beverages. They tend to irritate your bladder.  Empty your bladder often. Avoid holding urine for long periods of time.  Empty your bladder before and after sexual intercourse.  After a bowel movement, women should cleanse from front to back. Use each tissue only once. SEEK MEDICAL CARE IF:   You have back pain.  You develop a fever.  Your symptoms do not begin to resolve within 3 days. SEEK IMMEDIATE MEDICAL CARE IF:   You have severe back pain or lower abdominal pain.  You develop chills.  You have nausea or vomiting.  You have continued burning or discomfort with urination. MAKE SURE YOU:   Understand  these instructions.  Will watch your condition.  Will get help right away if you are not doing well or get worse. Document Released: 12/05/2004 Document Revised: 08/27/2011 Document Reviewed: 04/05/2011 [facility] Patient Information 2014 [place], [place]. ops.  Repeated vomiting occurs  (multiple episodes).  The pain becomes localized to portions of the abdomen. The right side could possibly be appendicitis. In an adult, the left lower portion of the abdomen could be colitis or diverticulitis.  Blood is being passed in stools or vomit (bright red or black tarry stools).  Return also if you develop chest pain, difficulty breathing, dizziness or fainting, or become confused, poorly responsive, or inconsolable (young children).

## 2013-07-09 NOTE — ED Notes (Signed)
Per pt sts severe acid reflux and vomiting yellow bile since Wednesday. sts she has been taking nexium without relief. sts she has vomited 3 or 4 times and once before she came in today.

## 2013-07-09 NOTE — ED Provider Notes (Addendum)
CSN: [id number]     Arrival date & time 07/09/13  0865 History   First MD Initiated Contact with Patient 07/09/13 [phone]     Chief Complaint  Patient presents with  . Gastrophageal Reflux  . Emesis     (Consider location/radiation/quality/duration/timing/severity/associated sxs/prior Treatment) The history is provided by the patient and medical records. No language interpreter was used.    [name] is a(n) 38 y.o. female who presents  Emergency department with chief complaint of abdominal pain. Patient has a past medical history of IBS, GERD, hypertension, distant history of cocaine abuse. The patient states that she's had severe epigastric pain he pain for the past 3-4 days. She's had worsening nausea and vomiting. She complains of 3-4 episodes of vomiting over the past 2 days which was initially composed of food and then became predominantly gastric acid. The patient has previous admissions for these episodes of severe epigastric pain. She had an EGD performed by Dr. Collene Mares back in 2008 however these are handwritten notes and it si difficult to interpret. However, in speaking to the patient it sounds as if she has a history of hiatal hernia which intermittently causes her severe Gastric distress. She is on a current PPI daily along with Carafate for what she describes as esophageal ulcerations. She is followed by the internal medicine teaching service. She is a previous surgical history of cholecystectomy and appendix removal. She denies any alcohol or drug abuse. She denies taking any. She denies any melena or hematochezia. She states that she had several bloody stools daily after her cholecystectomy. She is unable to hold down food or fluid at home.  Past Medical History  Diagnosis Date  . History of cocaine abuse 09/28/2010  . Hypertension   . GERD (gastroesophageal reflux disease)   . IBS (irritable bowel syndrome)   . Depression   . Mood swings    Past Surgical History  Procedure  Laterality Date  . Appendectomy    . Cholecystectomy    . Tubal ligation    . Tonsillectomy     Family History  Problem Relation Age of Onset  . Hypertension Mother   . Heart disease Father 5    MI  . Stroke Father 64  . Diabetes Father   . GER disease Sister   . Cancer Maternal Grandmother     uterine and stomach   History  Substance Use Topics  . Smoking status: Current Every Day Smoker -- 0.30 packs/day    Types: Cigarettes  . Smokeless tobacco: Not on file  . Alcohol Use: No   OB History   Grav Para Term Preterm Abortions TAB SAB Ect Mult Living                 Review of Systems  Ten systems reviewed and are negative for acute change, except as noted in the HPI.    Allergies  Hydrocodone  Home Medications   Prior to Admission medications   Medication Sig Start Date End Date Taking? Authorizing Provider  calcium carbonate (TUMS - DOSED IN MG ELEMENTAL CALCIUM) 500 MG chewable tablet Chew 2 tablets by mouth 2 (two) times daily as needed for heartburn.   Yes Historical Provider, MD  esomeprazole (NEXIUM) 40 MG capsule Take 40 mg by mouth daily at 12 noon.   Yes Historical Provider, MD  hydrochlorothiazide (HYDRODIURIL) 25 MG tablet Take 25 mg by mouth every morning.   Yes Historical Provider, MD   BP 114/77  Pulse 66  Temp(Src) 98.1 F (36.7 C) (Oral)  Resp 20  SpO2 99%  LMP 06/21/2013 Physical Exam  Nursing note and vitals reviewed. Constitutional: She is oriented to person, place, and time. She appears well-developed and well-nourished. No distress.  Appears uncomfortable  HENT:  Head: Normocephalic and atraumatic.  Eyes: Conjunctivae are normal. No scleral icterus.  Neck: Normal range of motion.  Cardiovascular: Normal rate, regular rhythm and normal heart sounds.  Exam reveals no gallop and no friction rub.   No murmur heard. Pulmonary/Chest: Effort normal and breath sounds normal. No respiratory distress.  Abdominal: Soft. Bowel sounds are normal.  She exhibits no distension and no mass. There is tenderness. There is guarding.  Epigatric tenderness. Intermittent retching  Neurological: She is alert and oriented to person, place, and time.  Skin: Skin is warm and dry. She is not diaphoretic.    ED Course  Procedures (including critical care time) Labs Review Labs Reviewed  CBC WITH DIFFERENTIAL - Abnormal; Notable for the following:    Hemoglobin 11.9 (*)    RDW 16.5 (*)    All other components within normal limits  COMPREHENSIVE METABOLIC PANEL  LIPASE, BLOOD  URINALYSIS, ROUTINE W REFLEX MICROSCOPIC  I-STAT TROPOININ, ED  POC URINE PREG, ED    Imaging Review No results found.   EKG Interpretation   Date/Time:  Friday Jul 09 2013 10:10:28 EDT Ventricular Rate:  63 PR Interval:  149 QRS Duration: 80 QT Interval:  395 QTC Calculation: 404 R Axis:   27 Text Interpretation:  Sinus rhythm Atrial premature complex No significant  change since last tracing Confirmed by ZACKOWSKI  MD, [place] ([id number]) on  07/09/2013 10:29:08 AM      MDM   Final diagnoses:  None    Patient here with severe epigastric pain. I have ordered IV Pap sent. Also pain medications antinausea medicine and bolus of fluids. Her CBC is back and she is stable hemoglobin with chronic anemia. EKG is without any significant changes and no sign of ischemia.  Labs are otherwise pending at this point.  Patient pain relieved. Tolerating PO fluids and no further episodes of vomiting.   Patient is nontoxic, nonseptic appearing, in no apparent distress.  Patient's pain and other symptoms adequately managed in emergency department.  Fluid bolus given.  Labs, imaging and vitals reviewed.  Patient does not meet the SIRS or Sepsis criteria.  On repeat exam patient does not have a surgical abdomin and there are nor peritoneal signs.  No indication of appendicitis, bowel obstruction, bowel perforation, cholecystitis, diverticulitis, PID or ectopic pregnancy.  Patient  discharged home with symptomatic treatment and given strict instructions for follow-up with their primary care physician.  I have also discussed reasons to return immediately to the ER.  Patient expresses understanding and agrees with plan.     Margarita Mail, PA-C 07/12/13 [address] PA-C 08/02/13 2058

## 2013-07-13 NOTE — ED Provider Notes (Signed)
Medical screening examination/treatment/procedure(s) were performed by non-physician practitioner and as supervising physician I was immediately available for consultation/collaboration.   EKG Interpretation   Date/Time:  Friday Jul 09 2013 10:10:28 EDT Ventricular Rate:  63 PR Interval:  149 QRS Duration: 80 QT Interval:  395 QTC Calculation: 404 R Axis:   27 Text Interpretation:  Sinus rhythm Atrial premature complex No significant  change since last tracing Confirmed by Brett Soza  MD, Illana Nolting ([id number]) on  07/09/2013 10:29:08 AM        Mervin Kung, MD 07/13/13 [phone]

## 2013-08-05 NOTE — ED Provider Notes (Signed)
Medical screening examination/treatment/procedure(s) were performed by non-physician practitioner and as supervising physician I was immediately available for consultation/collaboration.   EKG Interpretation   Date/Time:  Friday Jul 09 2013 10:10:28 EDT Ventricular Rate:  63 PR Interval:  149 QRS Duration: 80 QT Interval:  395 QTC Calculation: 404 R Axis:   27 Text Interpretation:  Sinus rhythm Atrial premature complex No significant  change since last tracing Confirmed by Griffin Gerrard  MD, [place] ([id number]) on  07/09/2013 10:29:08 AM       Fredia Sorrow, MD 08/05/13 1049

## 2013-09-24 ENCOUNTER — Encounter (HOSPITAL_COMMUNITY): Payer: Self-pay | Admitting: Emergency Medicine

## 2013-09-24 ENCOUNTER — Emergency Department (HOSPITAL_COMMUNITY)
Admission: EM | Admit: 2013-09-24 | Discharge: 2013-09-24 | Disposition: A | Discharge status: Home / Self Care | Payer: Self-pay | Attending: Emergency Medicine | Admitting: Emergency Medicine

## 2013-09-24 DIAGNOSIS — Z792 Long term (current) use of antibiotics: Secondary | ICD-10-CM | POA: Insufficient documentation

## 2013-09-24 DIAGNOSIS — Z79899 Other long term (current) drug therapy: Secondary | ICD-10-CM | POA: Insufficient documentation

## 2013-09-24 DIAGNOSIS — Z885 Allergy status to narcotic agent status: Secondary | ICD-10-CM | POA: Insufficient documentation

## 2013-09-24 DIAGNOSIS — F329 Major depressive disorder, single episode, unspecified: Secondary | ICD-10-CM | POA: Insufficient documentation

## 2013-09-24 DIAGNOSIS — F1411 Cocaine abuse, in remission: Secondary | ICD-10-CM | POA: Insufficient documentation

## 2013-09-24 DIAGNOSIS — R21 Rash and other nonspecific skin eruption: Secondary | ICD-10-CM | POA: Insufficient documentation

## 2013-09-24 DIAGNOSIS — F17200 Nicotine dependence, unspecified, uncomplicated: Secondary | ICD-10-CM | POA: Insufficient documentation

## 2013-09-24 DIAGNOSIS — K219 Gastro-esophageal reflux disease without esophagitis: Secondary | ICD-10-CM | POA: Insufficient documentation

## 2013-09-24 DIAGNOSIS — I10 Essential (primary) hypertension: Secondary | ICD-10-CM | POA: Insufficient documentation

## 2013-09-24 DIAGNOSIS — Y9289 Other specified places as the place of occurrence of the external cause: Secondary | ICD-10-CM | POA: Insufficient documentation

## 2013-09-24 DIAGNOSIS — F3289 Other specified depressive episodes: Secondary | ICD-10-CM | POA: Insufficient documentation

## 2013-09-24 DIAGNOSIS — Y939 Activity, unspecified: Secondary | ICD-10-CM | POA: Insufficient documentation

## 2013-09-24 DIAGNOSIS — IMO0001 Reserved for inherently not codable concepts without codable children: Secondary | ICD-10-CM | POA: Insufficient documentation

## 2013-09-24 DIAGNOSIS — L089 Local infection of the skin and subcutaneous tissue, unspecified: Secondary | ICD-10-CM

## 2013-09-24 DIAGNOSIS — K589 Irritable bowel syndrome without diarrhea: Secondary | ICD-10-CM | POA: Insufficient documentation

## 2013-09-24 DIAGNOSIS — W57XXXA Bitten or stung by nonvenomous insect and other nonvenomous arthropods, initial encounter: Secondary | ICD-10-CM

## 2013-09-24 DIAGNOSIS — F39 Unspecified mood [affective] disorder: Secondary | ICD-10-CM | POA: Insufficient documentation

## 2013-09-24 MED ORDER — PERMETHRIN 5 % EX CREA: TOPICAL_CREAM | CUTANEOUS | Status: DC

## 2013-09-24 MED ORDER — CEPHALEXIN 500 MG PO CAPS: 500.0000 mg | ORAL_CAPSULE | Freq: Four times a day (QID) | ORAL | Status: DC

## 2013-09-24 NOTE — ED Provider Notes (Signed)
Medical screening examination/treatment/procedure(s) were performed by non-physician practitioner and as supervising physician I was immediately available for consultation/collaboration.    Dorie Rank, MD 09/24/13 1149

## 2013-09-24 NOTE — ED Notes (Signed)
Pt states moved into motel for approx 5 months.  Pt recently noted bites over entire body.  Pt states worms are coming out of wounds and returning back into wounds.  No visible insect/worm noted in triage assessment.  Multiple bites/redness areas over entire body.

## 2013-09-24 NOTE — ED Provider Notes (Signed)
CSN: [id number]     Arrival date & time 09/24/13  0912 History   First MD Initiated Contact with Patient 09/24/13 1010     Chief Complaint  Patient presents with  . Insect Bite     (Consider location/radiation/quality/duration/timing/severity/associated sxs/prior Treatment) HPI Comments: Patient is a 38 year old female with a past medical history of hypertension, GERD, depression, and mood swings who presents with a 2 week history of insect bites over her entire body. Symptoms started gradually and progressively worsened since the onset. Patient reports living in a motel for the past 5 months and noticing bed bugs on the bed 2 weeks ago. Patient reports itching initially all over her body then the bites started to hurt and become "inflammed." Patient reports "worms coming out of her skin." No aggravating/alleviating factors. No other associated symptoms. Patient's boyfriend is also itching. He lives in the same motel with her.    Past Medical History  Diagnosis Date  . History of cocaine abuse 09/28/2010  . Hypertension   . GERD (gastroesophageal reflux disease)   . IBS (irritable bowel syndrome)   . Depression   . Mood swings    Past Surgical History  Procedure Laterality Date  . Appendectomy    . Cholecystectomy    . Tubal ligation    . Tonsillectomy     Family History  Problem Relation Age of Onset  . Hypertension Mother   . Heart disease Father 30    MI  . Stroke Father 14  . Diabetes Father   . GER disease Sister   . Cancer Maternal Grandmother     uterine and stomach   History  Substance Use Topics  . Smoking status: Current Every Day Smoker -- 0.30 packs/day    Types: Cigarettes  . Smokeless tobacco: Not on file  . Alcohol Use: Yes     Comment: occ   OB History   Grav Para Term Preterm Abortions TAB SAB Ect Mult Living                 Review of Systems  Constitutional: Negative for fever, chills and fatigue.  HENT: Negative for trouble swallowing.   Eyes:  Negative for visual disturbance.  Respiratory: Negative for shortness of breath.   Cardiovascular: Negative for chest pain and palpitations.  Gastrointestinal: Negative for nausea, vomiting, abdominal pain and diarrhea.  Genitourinary: Negative for dysuria and difficulty urinating.  Musculoskeletal: Negative for arthralgias and neck pain.  Skin: Positive for rash and wound. Negative for color change.  Neurological: Negative for dizziness and weakness.  Psychiatric/Behavioral: Negative for dysphoric mood.      Allergies  Hydrocodone  Home Medications   Prior to Admission medications   Medication Sig Start Date End Date Taking? Authorizing Provider  calcium carbonate (TUMS - DOSED IN MG ELEMENTAL CALCIUM) 500 MG chewable tablet Chew 2 tablets by mouth 2 (two) times daily as needed for heartburn.    Historical Provider, MD  esomeprazole (NEXIUM) 40 MG capsule Take 40 mg by mouth daily at 12 noon.    Historical Provider, MD  hydrochlorothiazide (HYDRODIURIL) 25 MG tablet Take 25 mg by mouth every morning.    Historical Provider, MD  oxyCODONE-acetaminophen (ROXICET) 5-325 MG per tablet Take 1-2 tablets by mouth every 4 (four) hours as needed for severe pain. 07/09/13   Margarita Mail, PA-C  sulfamethoxazole-trimethoprim (BACTRIM DS) 800-160 MG per tablet Take 1 tablet by mouth 2 (two) times daily. 07/09/13   Margarita Mail, PA-C   BP  145/91  Pulse 103  Temp(Src) 98.1 F (36.7 C) (Oral)  SpO2 98%  LMP 09/18/2013 Physical Exam  Nursing note and vitals reviewed. Constitutional: She is oriented to person, place, and time. She appears well-developed and well-nourished. No distress.  HENT:  Head: Normocephalic and atraumatic.  Eyes: Conjunctivae and EOM are normal.  Neck: Normal range of motion.  Cardiovascular: Normal rate and regular rhythm.  Exam reveals no gallop and no friction rub.   No murmur heard. Pulmonary/Chest: Effort normal and breath sounds normal. She has no wheezes. She has  no rales. She exhibits no tenderness.  Musculoskeletal: Normal range of motion.  Neurological: She is alert and oriented to person, place, and time. Coordination normal.  Speech is goal-oriented. Moves limbs without ataxia.   Skin: Skin is warm and dry.  Scattered lesions on bilateral legs arms and also posterior neck that are slightly raised and erythematous with central wound. The areas are tender to palpation and pruritic.   Psychiatric: She has a normal mood and affect. Her behavior is normal.    ED Course  Procedures (including critical care time) Labs Review Labs Reviewed - No data to display  Imaging Review No results found.   EKG Interpretation None      MDM   Final diagnoses:  Bed bug bite  Skin infection    10:34 AM Patient will be treated with permethrin and keflex. Vitals stable and patient afebrile. Patient has bed bug bites the appear to be infected. Patient also complaining of "worms" coming out of her skin which do not appear to be worms. It appears to be sebum from hair follicles. 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 Patient advised to follow up with her PCP.    Alvina Chou, PA-C 09/24/13 1048

## 2013-09-24 NOTE — Discharge Instructions (Signed)
Use Permethrin cream as directed. Take Keflex as directed until gone. Refer to attached documents for more information. Follow up with your doctor in 1 week for further evaluation.

## 2013-09-24 NOTE — Progress Notes (Signed)
[facility] provided pt with a list of primary care resources and a [facility] orange Card application to help patient establish primary care.

## 2013-10-10 ENCOUNTER — Emergency Department (HOSPITAL_BASED_OUTPATIENT_CLINIC_OR_DEPARTMENT_OTHER)
Admission: EM | Admit: 2013-10-10 | Discharge: 2013-10-10 | Disposition: A | Discharge status: Home / Self Care | Payer: Self-pay | Attending: Emergency Medicine | Admitting: Emergency Medicine

## 2013-10-10 ENCOUNTER — Encounter (HOSPITAL_BASED_OUTPATIENT_CLINIC_OR_DEPARTMENT_OTHER): Payer: Self-pay | Admitting: Emergency Medicine

## 2013-10-10 DIAGNOSIS — W57XXXA Bitten or stung by nonvenomous insect and other nonvenomous arthropods, initial encounter: Secondary | ICD-10-CM | POA: Insufficient documentation

## 2013-10-10 DIAGNOSIS — I10 Essential (primary) hypertension: Secondary | ICD-10-CM | POA: Insufficient documentation

## 2013-10-10 DIAGNOSIS — Z23 Encounter for immunization: Secondary | ICD-10-CM | POA: Insufficient documentation

## 2013-10-10 DIAGNOSIS — M79609 Pain in unspecified limb: Secondary | ICD-10-CM | POA: Insufficient documentation

## 2013-10-10 DIAGNOSIS — Z792 Long term (current) use of antibiotics: Secondary | ICD-10-CM | POA: Insufficient documentation

## 2013-10-10 DIAGNOSIS — F17200 Nicotine dependence, unspecified, uncomplicated: Secondary | ICD-10-CM | POA: Insufficient documentation

## 2013-10-10 DIAGNOSIS — Z79899 Other long term (current) drug therapy: Secondary | ICD-10-CM | POA: Insufficient documentation

## 2013-10-10 DIAGNOSIS — K219 Gastro-esophageal reflux disease without esophagitis: Secondary | ICD-10-CM | POA: Insufficient documentation

## 2013-10-10 DIAGNOSIS — Y929 Unspecified place or not applicable: Secondary | ICD-10-CM | POA: Insufficient documentation

## 2013-10-10 DIAGNOSIS — Z8659 Personal history of other mental and behavioral disorders: Secondary | ICD-10-CM | POA: Insufficient documentation

## 2013-10-10 DIAGNOSIS — IMO0001 Reserved for inherently not codable concepts without codable children: Secondary | ICD-10-CM | POA: Insufficient documentation

## 2013-10-10 DIAGNOSIS — S90569A Insect bite (nonvenomous), unspecified ankle, initial encounter: Secondary | ICD-10-CM | POA: Insufficient documentation

## 2013-10-10 DIAGNOSIS — Y9389 Activity, other specified: Secondary | ICD-10-CM | POA: Insufficient documentation

## 2013-10-10 MED ORDER — TETANUS-DIPHTH-ACELL PERTUSSIS 5-2.5-18.5 LF-MCG/0.5 IM SUSP
0.5000 mL | Freq: Once | INTRAMUSCULAR | Status: AC
  Administered 2013-10-10: 0.5 mL via INTRAMUSCULAR
  Filled 2013-10-10: qty 0.5

## 2013-10-10 MED ORDER — OXYCODONE-ACETAMINOPHEN 5-325 MG PO TABS: 1.0000 | ORAL_TABLET | Freq: Four times a day (QID) | ORAL | Status: DC | PRN

## 2013-10-10 MED ORDER — OXYCODONE-ACETAMINOPHEN 5-325 MG PO TABS
1.0000 | ORAL_TABLET | Freq: Once | ORAL | Status: AC
Start: 2013-10-10 — End: 2013-10-10
  Administered 2013-10-10: 1 via ORAL
  Filled 2013-10-10: qty 1

## 2013-10-10 MED ORDER — CEPHALEXIN 500 MG PO CAPS: 500.0000 mg | ORAL_CAPSULE | Freq: Four times a day (QID) | ORAL | Status: DC

## 2013-10-10 MED ORDER — SULFAMETHOXAZOLE-TRIMETHOPRIM 800-160 MG PO TABS: 1.0000 | ORAL_TABLET | Freq: Two times a day (BID) | ORAL | Status: DC

## 2013-10-10 NOTE — ED Provider Notes (Signed)
CSN: [id number]     Arrival date & time 10/10/13  9323 History   First MD Initiated Contact with Patient 10/10/13 0845     Chief Complaint  Patient presents with  . Leg Pain     (Consider location/radiation/quality/duration/timing/severity/associated sxs/prior Treatment) Patient is a 38 y.o. female presenting with leg pain. The history is provided by the patient.  Leg Pain Location:  Ankle Time since incident:  1 day Injury: no   Ankle location:  L ankle Pain details:    Quality:  Aching   Radiates to:  Does not radiate   Severity:  Moderate   Onset quality:  Gradual   Duration:  1 day   Timing:  Constant   Progression:  Unchanged Chronicity:  Recurrent Dislocation: no   Foreign body present:  No foreign bodies Tetanus status:  Unknown Prior injury to area:  No Relieved by:  Nothing Worsened by:  Activity Ineffective treatments:  None tried Associated symptoms: no back pain, no fatigue, no fever and no neck pain     Past Medical History  Diagnosis Date  . History of cocaine abuse 09/28/2010  . Hypertension   . GERD (gastroesophageal reflux disease)   . IBS (irritable bowel syndrome)   . Depression   . Mood swings    Past Surgical History  Procedure Laterality Date  . Appendectomy    . Cholecystectomy    . Tubal ligation    . Tonsillectomy     Family History  Problem Relation Age of Onset  . Hypertension Mother   . Heart disease Father 45    MI  . Stroke Father 36  . Diabetes Father   . GER disease Sister   . Cancer Maternal Grandmother     uterine and stomach   History  Substance Use Topics  . Smoking status: Current Every Day Smoker -- 0.30 packs/day    Types: Cigarettes  . Smokeless tobacco: Not on file  . Alcohol Use: Yes     Comment: occ   OB History   Grav Para Term Preterm Abortions TAB SAB Ect Mult Living                 Review of Systems  Constitutional: Negative for fever and fatigue.  HENT: Negative for congestion and drooling.    Eyes: Negative for pain.  Respiratory: Negative for cough and shortness of breath.   Cardiovascular: Negative for chest pain.  Gastrointestinal: Negative for nausea, vomiting, abdominal pain and diarrhea.  Genitourinary: Negative for dysuria and hematuria.  Musculoskeletal: Negative for back pain, gait problem and neck pain.  Skin: Positive for rash. Negative for color change.  Neurological: Negative for dizziness and headaches.  Hematological: Negative for adenopathy.  Psychiatric/Behavioral: Negative for behavioral problems.  All other systems reviewed and are negative.     Allergies  Hydrocodone  Home Medications   Prior to Admission medications   Medication Sig Start Date End Date Taking? Authorizing Provider  calcium carbonate (TUMS - DOSED IN MG ELEMENTAL CALCIUM) 500 MG chewable tablet Chew 2 tablets by mouth 2 (two) times daily as needed for heartburn.    Historical Provider, MD  cephALEXin (KEFLEX) 500 MG capsule Take 1 capsule (500 mg total) by mouth 4 (four) times daily. 09/24/13   Kaitlyn Szekalski, PA-C  esomeprazole (NEXIUM) 40 MG capsule Take 40 mg by mouth daily at 12 noon.    Historical Provider, MD  hydrochlorothiazide (HYDRODIURIL) 25 MG tablet Take 25 mg by mouth every morning.  Historical Provider, MD  oxyCODONE-acetaminophen (ROXICET) 5-325 MG per tablet Take 1-2 tablets by mouth every 4 (four) hours as needed for severe pain. 07/09/13   Margarita Mail, PA-C  permethrin (ELIMITE) 5 % cream Apply to affected area once 09/24/13   Alvina Chou, PA-C  sulfamethoxazole-trimethoprim (BACTRIM DS) 800-160 MG per tablet Take 1 tablet by mouth 2 (two) times daily. 07/09/13   Abigail Harris, PA-C   BP 138/103  Pulse 73  Temp(Src) 98.3 F (36.8 C) (Oral)  Resp 18  Ht 5\' 1"  (1.549 m)  Wt 165 lb (74.844 kg)  BMI 31.19 kg/m2  SpO2 100%  LMP 09/18/2013 Physical Exam  Nursing note and vitals reviewed. Constitutional: She is oriented to person, place, and time. She  appears well-developed and well-nourished.  HENT:  Head: Normocephalic and atraumatic.  Mouth/Throat: Oropharynx is clear and moist. No oropharyngeal exudate.  Eyes: Conjunctivae and EOM are normal. Pupils are equal, round, and reactive to light.  Neck: Normal range of motion. Neck supple.  Cardiovascular: Normal rate, regular rhythm, normal heart sounds and intact distal pulses.  Exam reveals no gallop and no friction rub.   No murmur heard. Pulmonary/Chest: Effort normal and breath sounds normal. No respiratory distress. She has no wheezes.  Abdominal: Soft. Bowel sounds are normal. There is no tenderness. There is no rebound and no guarding.  Musculoskeletal: Normal range of motion. She exhibits no edema and no tenderness.  Mild erythema and swelling to the posterior and lateral aspect of the left ankle. Several papules noted centrally. Normal range of motion of the left ankle but pain with range of motion. 2+ distal pulses in the lower extremities.  The patient has an erythematous wheal on her left elbow and right lateral thigh as well. Both w/ central papular lesions.   Neurological: She is alert and oriented to person, place, and time.  Skin: Skin is warm and dry.  Psychiatric: She has a normal mood and affect. Her behavior is normal.    ED Course  Procedures (including critical care time) Labs Review Labs Reviewed - No data to display  Imaging Review No results found.   EKG Interpretation None      MDM   Final diagnoses:  Insect bites    9:12 AM 38 y.o. female seen on 09/24/13 for insect bites who presents with continued insect bites. She states that she had a bite to her left posterior ankle yesterday. She has had mild swelling and redness in this area as well as increased pain since then. She also has a lesion noted on her left elbow and her right lateral thigh. They are mildly pruritic. She denies any fevers or other associated symptoms. She continues to live at a motel  but has changed rooms several times. I suspect this is likely due to bedbugs as the lesions do not appear to be consistent with scabies and the patient was previously treated for this. She appears well and vital signs are unremarkable here. Will provide crutches d/t the pain with range of motion of her left ankle. Will cover for cellulitis. Do not think this is an abscess or septic joint. Will update tdap.   9:23 AM:  I have discussed the diagnosis/risks/treatment options with the patient and believe the pt to be eligible for discharge home to follow-up with her pcp as needed. We also discussed returning to the ED immediately if new or worsening sx occur. We discussed the sx which are most concerning (e.g., worsening pain, fever, spreading redness,  inc swelling) that necessitate immediate return. Medications administered to the patient during their visit and any new prescriptions provided to the patient are listed below.  Medications given during this visit Medications  oxyCODONE-acetaminophen (PERCOCET/ROXICET) 5-325 MG per tablet 1 tablet (not administered)    New Prescriptions   CEPHALEXIN (KEFLEX) 500 MG CAPSULE    Take 1 capsule (500 mg total) by mouth 4 (four) times daily.   OXYCODONE-ACETAMINOPHEN (PERCOCET) 5-325 MG PER TABLET    Take 1 tablet by mouth every 6 (six) hours as needed.   SULFAMETHOXAZOLE-TRIMETHOPRIM (SEPTRA DS) 800-160 MG PER TABLET    Take 1 tablet by mouth every 12 (twelve) hours.       Blanchard Kelch, MD 10/10/13 [id number]

## 2013-10-10 NOTE — ED Notes (Signed)
Patient refused crutches.

## 2013-10-10 NOTE — ED Notes (Signed)
Patient c/o L lower leg swelling pain due to insect bite, hurts to touch or bear weight

## 2013-11-17 ENCOUNTER — Emergency Department (HOSPITAL_COMMUNITY)
Admission: EM | Admit: 2013-11-17 | Discharge: 2013-11-17 | Disposition: A | Discharge status: Home / Self Care | Payer: Self-pay | Attending: Emergency Medicine | Admitting: Emergency Medicine

## 2013-11-17 ENCOUNTER — Encounter (HOSPITAL_COMMUNITY): Payer: Self-pay | Admitting: Emergency Medicine

## 2013-11-17 DIAGNOSIS — I10 Essential (primary) hypertension: Secondary | ICD-10-CM | POA: Insufficient documentation

## 2013-11-17 DIAGNOSIS — Z79899 Other long term (current) drug therapy: Secondary | ICD-10-CM | POA: Insufficient documentation

## 2013-11-17 DIAGNOSIS — F17200 Nicotine dependence, unspecified, uncomplicated: Secondary | ICD-10-CM | POA: Insufficient documentation

## 2013-11-17 DIAGNOSIS — Z8659 Personal history of other mental and behavioral disorders: Secondary | ICD-10-CM | POA: Insufficient documentation

## 2013-11-17 DIAGNOSIS — K219 Gastro-esophageal reflux disease without esophagitis: Secondary | ICD-10-CM | POA: Insufficient documentation

## 2013-11-17 DIAGNOSIS — Z792 Long term (current) use of antibiotics: Secondary | ICD-10-CM | POA: Insufficient documentation

## 2013-11-17 DIAGNOSIS — F1410 Cocaine abuse, uncomplicated: Secondary | ICD-10-CM | POA: Insufficient documentation

## 2013-11-17 NOTE — Discharge Instructions (Signed)
Polysubstance Abuse °When people abuse more than one drug or type of drug it is called polysubstance or polydrug abuse. For example, many smokers also drink alcohol. This is one form of polydrug abuse. Polydrug abuse also refers to the use of a drug to counteract an unpleasant effect produced by another drug. It may also be used to help with withdrawal from another drug. People who take stimulants may become agitated. Sometimes this agitation is countered with a tranquilizer. This helps protect against the unpleasant side effects. Polydrug abuse also refers to the use of different drugs at the same time.  °Anytime drug use is interfering with normal living activities, it has become abuse. This includes problems with family and friends. Psychological dependence has developed when your mind tells you that the drug is needed. This is usually followed by physical dependence which has developed when continuing increases of drug are required to get the same feeling or "high". This is known as addiction or chemical dependency. A person's risk is much higher if there is a history of chemical dependency in the family. °SIGNS OF CHEMICAL DEPENDENCY °· You have been told by friends or family that drugs have become a problem. °· You fight when using drugs. °· You are having blackouts (not remembering what you do while using). °· You feel sick from using drugs but continue using. °· You lie about use or amounts of drugs (chemicals) used. °· You need chemicals to get you going. °· You are suffering in work performance or in school because of drug use. °· You get sick from use of drugs but continue to use anyway. °· You need drugs to relate to people or feel comfortable in social situations. °· You use drugs to forget problems. °"Yes" answered to any of the above signs of chemical dependency indicates there are problems. The longer the use of drugs continues, the greater the problems will become. °If there is a family history of  drug or alcohol use, it is best not to experiment with these drugs. Continual use leads to tolerance. After tolerance develops more of the drug is needed to get the same feeling. This is followed by addiction. With addiction, drugs become the most important part of life. It becomes more important to take drugs than participate in the other usual activities of life. This includes relating to friends and family. Addiction is followed by dependency. Dependency is a condition where drugs are now needed not just to get high, but to feel normal. °Addiction cannot be cured but it can be stopped. This often requires outside help and the care of professionals. Treatment centers are listed in the yellow pages under: Cocaine, Narcotics, and Alcoholics Anonymous. Most hospitals and clinics can refer you to a specialized care center. Talk to your caregiver if you need help. °Document Released: 10/17/2004 Document Revised: 05/20/2011 Document Reviewed: 02/25/2005 °ExitCare® Patient Information ©2015 [employer]. This information is not intended to replace advice given to you by your health care provider. Make sure you discuss any questions you have with your health care provider. ° ° ° °Emergency Department Resource Guide °1) Find a Doctor and Pay Out of Pocket °Although you won't have to find out who is covered by your insurance plan, it is a good idea to ask around and get recommendations. You will then need to call the office and see if the doctor you have chosen will accept you as a new patient and what types of options they offer for patients who   are self-pay. Some doctors offer discounts or will set up payment plans for their patients who do not have insurance, but you will need to ask so you aren't surprised when you get to your appointment. ° °2) Contact Your Local Health Department °Not all health departments have doctors that can see patients for sick visits, but many do, so it is worth a call to see if yours does. If  you don't know where your local health department is, you can check in your phone book. The CDC also has a tool to help you locate your state's health department, and many state websites also have listings of all of their local health departments. ° °3) Find a Walk-in Clinic °If your illness is not likely to be very severe or complicated, you may want to try a walk in clinic. These are popping up all over the country in pharmacies, drugstores, and shopping centers. They're usually staffed by nurse practitioners or physician assistants that have been trained to treat common illnesses and complaints. They're usually fairly quick and inexpensive. However, if you have serious medical issues or chronic medical problems, these are probably not your best option. ° °No Primary Care Doctor: °- Call [facility] at  832-8000 - they can help you locate a primary care doctor that  accepts your insurance, provides certain services, etc. °- Physician Referral Service- 1-800-533-3463 ° °Chronic Pain Problems: °Organization         Address  Phone   Notes  °[facility]  (336) 297-2271 Patients need to be referred by their primary care doctor.  ° °Medication Assistance: °Organization         Address  Phone   Notes  °[place] [facility] [address] °[place], [place] [address] (336) 641-8030 --Must be a resident of [place] °-- Must have NO insurance coverage whatsoever (no Medicaid/ Medicare, etc.) °-- The pt. MUST have a primary care doctor that directs their care regularly and follows them in the community °  °[facility]  (866) 331-1348   °[employer]  (888) 892-1162   ° °Agencies that provide inexpensive medical care: °Organization         Address  Phone   Notes  °[facility]  (336) 832-8035   °[facility]    (336) 832-7272   °[facility] [address] °[place], [place] [address] (336) 832-4777   °[facility] [address], °[place] (336) 271-4999   °[facility]    (336) 373-0678   °[facility]    (336) 272-1050   °[facility] ° [address], [place] Phone:  (336) 832-4444, Fax:  (336) 832-4440 Hours of Operation:  9 am - 6 pm, M-F.  Also accepts Medicaid/Medicare and self-pay.  °[facility] ° [address],  Phone: (336) 832-3150, Fax: (336) 832-3151. Hours of Operation:  8:30 am - 5:30 pm, M-F.  Also accepts Medicaid and self-pay.  °[facility] [place] [address], [place] Phone: (336) 878-6027   °[facility] [address], [place], [place] (336)723-1848, Ext. 123 Mondays & Thursdays: 7-9 AM.  First 15 patients are seen on a first come, first serve basis. °  ° °Medicaid-accepting [place] Providers: ° °Organization         Address  Phone   Notes  °[facility] [address], [place] (336) 641-2100 Also accepts self-pay patients.  °[facility] [address], [place] ° (336) 856-9996   °[facility] [address], [place] (336) 288-8857   °[facility] [address] [place] [address], [place] (336) 299-7000   °Veita Bland [address], [place]  ° (336) 373-1557 Only accepts [place] Access Medicaid patients after they have their name applied to their card.  ° °Self-Pay (no insurance) in [place]: ° °Organization         Address  Phone   Notes  °Sickle Cell Patients, [facility] [address], [place] (336) 832-1970   °[facility] [address], [place] (336) 832-4400   °[facility] [place] [facility] ° [address], [place] (336) 992-4800   °[facility]/Dr. Osei-Bonsu ° [address] [place] [address], [place] or [address], [place] (336) 841-8500  Phone number for both [place] and [place] locations is the same.  °[facility] [address], [place] (336) 299-0000   °[facility] [address] [place] [address], [place] or [address] (336) 852-7530 °(336) 878-2260   °[facility] [address], [place] (336) 350-1642, phone; (336) 294-5005, fax Sees patients 1st and 3rd Saturday of every month.  Must not qualify for public or private insurance (i.e. Medicaid, Medicare, [place] Health Choice, Veterans' Benefits) • Household income should be no more than 200% of the poverty level •The clinic cannot treat you if you are pregnant or think you are pregnant • Sexually transmitted diseases are not treated at the clinic.  ° ° °Dental Care: °Organization         Address  Phone  Notes  °[facility] [address], [place] (336) 641-6152 Accepts children up to age 21 who are enrolled in Medicaid or [place] Health Choice; pregnant women with a Medicaid card; and children who have applied for Medicaid or [place] Health Choice, but were declined, whose parents can pay a reduced fee at time of service.  °[facility] [place]  [address], [place] (336) 641-7733 Accepts children up to age 21 who are enrolled in Medicaid or [place] Health Choice; pregnant women with a Medicaid card; and children who have applied for Medicaid or [place] Health Choice, but were declined, whose parents can pay a reduced fee at time of service.  °[facility] ° [address], [place] (336) 641-4533 Patients are seen by appointment only. Walk-ins are not accepted. [facility] will see patients 18 years of age and older. °Monday - Tuesday (8am-5pm) °Most Wednesdays (8:30-5pm) °$30 per visit, cash only  °[facility] ° [address], [place] (336) 641-4533 Patients are seen by appointment  only. Walk-ins are not accepted. [facility] will see patients 18 years of age and older. °One Wednesday Evening (Monthly: Volunteer Based).  $30 per visit, cash only  °[facility]  (919) 537-3737 for adults; Children under age 4, call Graduate Pediatric Dentistry at (919) [phone]. Children aged 4-14, please call (919) [phone] to request a pediatric application. ° Dental services are provided in all areas of dental care including fillings, crowns and bridges, complete and partial dentures, implants, gum treatment, root canals,   and extractions. Preventive care is also provided. Treatment is provided to both adults and children. °Patients are selected via a lottery and there is often a waiting list. °  °[facility] [address], °[place] ° (336) 763-8833 [email or web address] °  °[facility] [address], [place], [place] (336)723-1848, Ext. 123 Second and Fourth Thursday of each month, opens at 6:30 AM; Clinic ends at 9 AM.  Patients are seen on a first-come first-served basis, and a limited number are seen during each clinic.  ° °[facility] ° [address], [place], [place] (336) 723-7904   Eligibility Requirements °You must have lived in [place], [place], or [place] counties for at least the last three months. °  You cannot be eligible for state or federal sponsored healthcare insurance, including Veterans Administration, Medicaid, or Medicare. °  You generally cannot be eligible for healthcare insurance through your employer.  °  How to apply: °Eligibility screenings are held every Tuesday and Wednesday afternoon from 1:00 pm until 4:00 pm. You do not need an appointment for the interview!  °[facility] [address], [place], [place] 336-631-2330   °[facility]  336-342-8273   °[facility]  336-703-3100   °[facility]  336-570-6415   ° °Behavioral Health  Resources in the Community: °Intensive Outpatient Programs °Organization         Address  Phone  Notes  °[facility] [address], [place], [place] 336-878-6098   °[facility] [address], [place], [place] 336-832-9800   °[facility] [address], [place], [place] ° 336-882-2125   °[facility] [address],  °[place], [place] 1-800-853-5163 or 336-641-4981   °Substance Abuse Resources °Organization         Address  Phone  Notes  °[facility]  336-882-2125   °[facility]  336-784-9470   °[facility]  336-285-9073   °[facility]  336-845-3988   °Residential & Outpatient Substance Abuse Program  1-800-659-3381   °Psychological Services °Organization         Address  Phone  Notes  °[facility]  336- 832-9600   °[facility]  336- 378-7881   °[facility] [address], [place] 1-800-853-5163 or 336-641-4981   ° °Mobile Crisis Teams °Organization         Address  Phone  Notes  °[facility], Mobile Crisis Care Unit  1-877-626-1772   °Assertive °[facility] ° [address] [place], [place] 336-834-9664   °Sharon DeEsch [address] °[place] 336-554-5454   ° °Self-Help/Support Groups °Organization         Address  Phone             Notes  °[facility] - variety of support groups  336- 373-1402 Call for more information  °Narcotics Anonymous (NA), [facility] [address], °[place]  2 meetings at this location  ° °Residential Treatment Programs °Organization         Address  Phone  Notes  °[facility] [address],    °[place]  1-866-801-8205   °[facility] ° [address], [place], [place] 704-293-8524   °[facility] [address], [place] 336-845-3988 Admissions: 8am-3pm M-F  °[facility] [address]    °[place], [place] 336-841-1104   °[facility] [address], [place], [place] 336-379-7146   °[facility] [address]  °[place], [place] 336-285-9073   °[facility] Intensive Outpatient [address], [place], [place] 336-852-3033   °[facility] [address]  °[place], [place] 1-877-615-2722 or 336-784-9470   °[facility] [address] [place], [place] 336-227-7417 Accepts Medicaid  °[facility] [address]  °[place] 1-800-659-3381 Substance Abuse/Addiction Treatment  ° °[place] Behavioral Health Resources °Organization         Address  Phone  Notes  °[facility]  (888) 581-9988   °Julie Brannon, PhD [address] [place], [place]   (336) 349-5553 or (336) 951-0000   °[facility]   [address] °[place], [place] (336) 349-4454   °[facility] [address], [place], [place] (336) 342-8316 Insurance/Medicaid/sponsorship through [facility]  °[facility] [address]                                    [place], [place] (336) 342-8316 Therapy/tele-psych/case  °[facility] [address]  ° [place], [place] (336) 349-2233    °Dr. Arfeen  (336) 349-4544   °[facility] [place]  [employer] [place] [facility] 1) [address], [place] °2) [address], [place] °3)  [address], [place] (336) 349-3220 °(336) 342-7768 ° °(336) 342-8140   °[place] [facility] (336) 342-1394 or (336) 342-3537 (After Hours)    ° ° ° °

## 2013-11-17 NOTE — ED Provider Notes (Signed)
CSN: [id number]     Arrival date & time 11/17/13  0846 History   First MD Initiated Contact with Patient 11/17/13 [phone]     Chief Complaint  Patient presents with  . Drug Problem     (Consider location/radiation/quality/duration/timing/severity/associated sxs/prior Treatment) HPI 38-year-old female presents wanting help for her cocaine abuse. She states she initially started using cocaine until some 5 was clean for about 8 years. She's been relapsing and using over the past year. Most really use within the last one to 2 days. Denies any pain, vomiting, or other symptoms at this time. She simply states she will see a clean. Denies any daily alcohol use, saying she drinks occasionally. She denies any suicidal or homicidal ideations.  Past Medical History  Diagnosis Date  . History of cocaine abuse 09/28/2010  . Hypertension   . GERD (gastroesophageal reflux disease)   . IBS (irritable bowel syndrome)   . Depression   . Mood swings    Past Surgical History  Procedure Laterality Date  . Appendectomy    . Cholecystectomy    . Tubal ligation    . Tonsillectomy     Family History  Problem Relation Age of Onset  . Hypertension Mother   . Heart disease Father 27    MI  . Stroke Father 18  . Diabetes Father   . GER disease Sister   . Cancer Maternal Grandmother     uterine and stomach   History  Substance Use Topics  . Smoking status: Current Every Day Smoker -- 0.30 packs/day    Types: Cigarettes  . Smokeless tobacco: Not on file  . Alcohol Use: Yes     Comment: occ   OB History   Grav Para Term Preterm Abortions TAB SAB Ect Mult Living                 Review of Systems  Constitutional: Negative for fever.  Gastrointestinal: Negative for vomiting and abdominal pain.  Psychiatric/Behavioral: Negative for suicidal ideas.  All other systems reviewed and are negative.     Allergies  Hydrocodone  Home Medications   Prior to Admission medications   Medication Sig  Start Date End Date Taking? Authorizing Provider  calcium carbonate (TUMS - DOSED IN MG ELEMENTAL CALCIUM) 500 MG chewable tablet Chew 2 tablets by mouth 2 (two) times daily as needed for heartburn.    Historical Provider, MD  cephALEXin (KEFLEX) 500 MG capsule Take 1 capsule (500 mg total) by mouth 4 (four) times daily. 09/24/13   Kaitlyn Szekalski, PA-C  cephALEXin (KEFLEX) 500 MG capsule Take 1 capsule (500 mg total) by mouth 4 (four) times daily. 10/10/13   Pamella Pert, MD  esomeprazole (NEXIUM) 40 MG capsule Take 40 mg by mouth daily at 12 noon.    Historical Provider, MD  hydrochlorothiazide (HYDRODIURIL) 25 MG tablet Take 25 mg by mouth every morning.    Historical Provider, MD  oxyCODONE-acetaminophen (PERCOCET) 5-325 MG per tablet Take 1 tablet by mouth every 6 (six) hours as needed. 10/10/13   Pamella Pert, MD  oxyCODONE-acetaminophen (ROXICET) 5-325 MG per tablet Take 1-2 tablets by mouth every 4 (four) hours as needed for severe pain. 07/09/13   Margarita Mail, PA-C  permethrin (ELIMITE) 5 % cream Apply to affected area once 09/24/13   Alvina Chou, PA-C  sulfamethoxazole-trimethoprim (BACTRIM DS) 800-160 MG per tablet Take 1 tablet by mouth 2 (two) times daily. 07/09/13   Margarita Mail, PA-C  sulfamethoxazole-trimethoprim (SEPTRA DS) 800-160 MG per tablet  Take 1 tablet by mouth every 12 (twelve) hours. 10/10/13   Pamella Pert, MD   BP 114/75  Pulse 74  Temp(Src) 98.4 F (36.9 C) (Oral)  Resp 16  SpO2 100%  LMP 11/12/2013 Physical Exam  Nursing note and vitals reviewed. Constitutional: She is oriented to person, place, and time. She appears well-developed and well-nourished. No distress.  HENT:  Head: Normocephalic and atraumatic.  Right Ear: External ear normal.  Left Ear: External ear normal.  Nose: Nose normal.  Eyes: Right eye exhibits no discharge. Left eye exhibits no discharge.  Cardiovascular: Normal rate, regular rhythm and normal heart sounds.    Pulmonary/Chest: Effort normal and breath sounds normal.  Abdominal: Soft. She exhibits no distension. There is no tenderness.  Neurological: She is alert and oriented to person, place, and time.  Skin: Skin is warm and dry.  Psychiatric: Her speech is normal and behavior is normal. Judgment normal. Cognition and memory are normal. She expresses no homicidal and no suicidal ideation.    ED Course  Procedures (including critical care time) Labs Review Labs Reviewed - No data to display  Imaging Review No results found.   EKG Interpretation None      MDM   Final diagnoses:  Cocaine abuse    Patient requesting cocaine detox. At this time her vital signs are normal and she is stable for outpatient treatment. She was given a list of mental health resources and encouraged to go there immediately after discharge to set up close outpatient followup and detox.    Ephraim Hamburger, MD 11/17/13 [phone]

## 2013-11-17 NOTE — ED Notes (Signed)
Patient states she wants to get resources for her cocaine use. Patient states the last time she used was 530pm.

## 2013-11-17 NOTE — Progress Notes (Signed)
[place],  Did not get to see patient but will be sending information about [facility] program to help patient establish primary care, using the address provided.

## 2013-11-29 ENCOUNTER — Encounter (HOSPITAL_COMMUNITY): Payer: Self-pay | Admitting: Emergency Medicine

## 2013-11-29 ENCOUNTER — Emergency Department (HOSPITAL_COMMUNITY)
Admission: EM | Admit: 2013-11-29 | Discharge: 2013-11-29 | Disposition: A | Discharge status: Home / Self Care | Payer: Self-pay | Attending: Emergency Medicine | Admitting: Emergency Medicine

## 2013-11-29 DIAGNOSIS — I10 Essential (primary) hypertension: Secondary | ICD-10-CM | POA: Insufficient documentation

## 2013-11-29 DIAGNOSIS — Z720 Tobacco use: Secondary | ICD-10-CM

## 2013-11-29 DIAGNOSIS — Z8659 Personal history of other mental and behavioral disorders: Secondary | ICD-10-CM | POA: Insufficient documentation

## 2013-11-29 DIAGNOSIS — K219 Gastro-esophageal reflux disease without esophagitis: Secondary | ICD-10-CM | POA: Insufficient documentation

## 2013-11-29 DIAGNOSIS — M549 Dorsalgia, unspecified: Secondary | ICD-10-CM | POA: Insufficient documentation

## 2013-11-29 DIAGNOSIS — Z79899 Other long term (current) drug therapy: Secondary | ICD-10-CM | POA: Insufficient documentation

## 2013-11-29 DIAGNOSIS — Z3202 Encounter for pregnancy test, result negative: Secondary | ICD-10-CM | POA: Insufficient documentation

## 2013-11-29 DIAGNOSIS — F1410 Cocaine abuse, uncomplicated: Secondary | ICD-10-CM | POA: Insufficient documentation

## 2013-11-29 DIAGNOSIS — F17200 Nicotine dependence, unspecified, uncomplicated: Secondary | ICD-10-CM | POA: Insufficient documentation

## 2013-11-29 DIAGNOSIS — K2970 Gastritis, unspecified, without bleeding: Secondary | ICD-10-CM | POA: Insufficient documentation

## 2013-11-29 DIAGNOSIS — K2990 Gastroduodenitis, unspecified, without bleeding: Principal | ICD-10-CM

## 2013-11-29 DIAGNOSIS — R109 Unspecified abdominal pain: Secondary | ICD-10-CM | POA: Insufficient documentation

## 2013-11-29 LAB — CBC WITH DIFFERENTIAL/PLATELET
Basophils Absolute: 0 10*3/uL (ref 0.0–0.1)
Basophils Relative: 0 % (ref 0–1)
Eosinophils Absolute: 0.1 10*3/uL (ref 0.0–0.7)
Eosinophils Relative: 1 % (ref 0–5)
HCT: 30.8 % — ABNORMAL LOW (ref 36.0–46.0)
HEMOGLOBIN: 10.3 g/dL — AB (ref 12.0–15.0)
LYMPHS ABS: 1.6 10*3/uL (ref 0.7–4.0)
Lymphocytes Relative: 15 % (ref 12–46)
MCH: 29.3 pg (ref 26.0–34.0)
MCHC: 33.4 g/dL (ref 30.0–36.0)
MCV: 87.5 fL (ref 78.0–100.0)
Monocytes Absolute: 0.7 10*3/uL (ref 0.1–1.0)
Monocytes Relative: 7 % (ref 3–12)
NEUTROS ABS: 8.1 10*3/uL — AB (ref 1.7–7.7)
NEUTROS PCT: 77 % (ref 43–77)
Platelets: 461 10*3/uL — ABNORMAL HIGH (ref 150–400)
RBC: 3.52 MIL/uL — AB (ref 3.87–5.11)
RDW: 15.8 % — ABNORMAL HIGH (ref 11.5–15.5)
WBC: 10.5 10*3/uL (ref 4.0–10.5)

## 2013-11-29 LAB — ETHANOL

## 2013-11-29 LAB — URINALYSIS, ROUTINE W REFLEX MICROSCOPIC
BILIRUBIN URINE: NEGATIVE
GLUCOSE, UA: NEGATIVE mg/dL
KETONES UR: NEGATIVE mg/dL
Nitrite: NEGATIVE
Protein, ur: 30 mg/dL — AB
Specific Gravity, Urine: 1.021 (ref 1.005–1.030)
Urobilinogen, UA: 0.2 mg/dL (ref 0.0–1.0)
pH: 6.5 (ref 5.0–8.0)

## 2013-11-29 LAB — RAPID URINE DRUG SCREEN, HOSP PERFORMED
Amphetamines: NOT DETECTED
Barbiturates: NOT DETECTED
Benzodiazepines: NOT DETECTED
COCAINE: POSITIVE — AB
Opiates: NOT DETECTED
TETRAHYDROCANNABINOL: POSITIVE — AB

## 2013-11-29 LAB — URINE MICROSCOPIC-ADD ON

## 2013-11-29 LAB — PREGNANCY, URINE: Preg Test, Ur: NEGATIVE

## 2013-11-29 LAB — LIPASE, BLOOD: Lipase: 15 U/L (ref 11–59)

## 2013-11-29 MED ORDER — SODIUM CHLORIDE 0.9 % IV BOLUS (SEPSIS)
500.0000 mL | Freq: Once | INTRAVENOUS | Status: AC
  Administered 2013-11-29: 500 mL via INTRAVENOUS

## 2013-11-29 MED ORDER — SODIUM CHLORIDE 0.9 % IV SOLN
INTRAVENOUS | Status: DC
  Administered 2013-11-29 (×2): via INTRAVENOUS

## 2013-11-29 MED ORDER — PANTOPRAZOLE SODIUM 40 MG IV SOLR
40.0000 mg | Freq: Once | INTRAVENOUS | Status: AC
  Administered 2013-11-29: 40 mg via INTRAVENOUS
  Filled 2013-11-29: qty 40

## 2013-11-29 MED ORDER — GI COCKTAIL ~~LOC~~
30.0000 mL | Freq: Once | ORAL | Status: AC
  Administered 2013-11-29: 30 mL via ORAL
  Filled 2013-11-29: qty 30

## 2013-11-29 MED ORDER — SUCRALFATE 1 G PO TABS: 1.0000 g | ORAL_TABLET | Freq: Three times a day (TID) | ORAL | Status: DC

## 2013-11-29 NOTE — Discharge Instructions (Signed)
Stop using cocaine, and tobacco. You will find that this helps your stomach get better. Get plenty of rest, and drink a lot of fluids.  Chemical Dependency Chemical dependency is an addiction to drugs or alcohol. It is characterized by the repeated behavior of seeking out and using drugs and alcohol despite harmful consequences to the health and safety of ones self and others.  RISK FACTORS There are certain situations or behaviors that increase a person's risk for chemical dependency. These include:  A family history of chemical dependency.  A history of mental health issues, including depression and anxiety.  A home environment where drugs and alcohol are easily available to you.  Drug or alcohol use at a young age. SYMPTOMS  The following symptoms can indicate chemical dependency:  Inability to limit the use of drugs or alcohol.  Nausea, sweating, shakiness, and anxiety that occurs when alcohol or drugs are not being used.  An increase in amount of drugs or alcohol that is necessary to get drunk or high. People who experience these symptoms can assess their use of drugs and alcohol by asking themselves the following questions:  Have you been told by friends or family that they are worried about your use of alcohol or drugs?  Do friends and family ever tell you about things you did while drinking alcohol or using drugs that you do not remember?  Do you lie about using alcohol or drugs or about the amounts you use?  Do you have difficulty completing daily tasks unless you use alcohol or drugs?  Is the level of your work or school performance lower because of your drug or alcohol use?  Do you get sick from using drugs or alcohol but keep using anyway?  Do you feel uncomfortable in social situations unless you use alcohol or drugs?  Do you use drugs or alcohol to help forget problems? An answer of yes to any of these questions may indicate chemical dependency. Professional  evaluation is suggested. Document Released: 02/19/2001 Document Revised: 05/20/2011 Document Reviewed: 05/03/2010 [facility] Patient Information 2015 [place], [place]. This information is not intended to replace advice given to you by your health care provider. Make sure you discuss any questions you have with your health care provider.

## 2013-11-29 NOTE — ED Notes (Signed)
Back pain hurts to void and she has been vomiting states her acid reflux is acting up abd pain also

## 2013-11-29 NOTE — ED Provider Notes (Signed)
CSN: [id number]     Arrival date & time 11/29/13  0814 History   First MD Initiated Contact with Patient 11/29/13 [phone]     Chief Complaint  Patient presents with  . Back Pain  . Emesis     (Consider location/radiation/quality/duration/timing/severity/associated sxs/prior Treatment) HPI  [name] is a 38 y.o. female presents for 2 days of upper abdominal pain. He feels like it is from her esophagitis, and "holes in the esophagus". She tried taking extra medication, omeprazole, but it did not help. She was unable to work because of the pain. She denies fever or vomiting, weakness, dizziness. She was in the emergency department 11 days ago complaining of addiction to cocaine and requesting detoxification. She was referred to outpatient therapy, but apparently did not go. There are no other known modifying factors.    Past Medical History  Diagnosis Date  . History of cocaine abuse 09/28/2010  . Hypertension   . GERD (gastroesophageal reflux disease)   . IBS (irritable bowel syndrome)   . Depression   . Mood swings    Past Surgical History  Procedure Laterality Date  . Appendectomy    . Cholecystectomy    . Tubal ligation    . Tonsillectomy     Family History  Problem Relation Age of Onset  . Hypertension Mother   . Heart disease Father 18    MI  . Stroke Father 47  . Diabetes Father   . GER disease Sister   . Cancer Maternal Grandmother     uterine and stomach   History  Substance Use Topics  . Smoking status: Current Every Day Smoker -- 0.30 packs/day    Types: Cigarettes  . Smokeless tobacco: Not on file  . Alcohol Use: Yes     Comment: occ   OB History   Grav Para Term Preterm Abortions TAB SAB Ect Mult Living                 Review of Systems  All other systems reviewed and are negative.     Allergies  Hydrocodone  Home Medications   Prior to Admission medications   Medication Sig Start Date End Date Taking? Authorizing Provider  calcium  carbonate (TUMS - DOSED IN MG ELEMENTAL CALCIUM) 500 MG chewable tablet Chew 2 tablets by mouth 2 (two) times daily as needed for heartburn.   Yes Historical Provider, MD  esomeprazole (NEXIUM) 40 MG capsule Take 40 mg by mouth daily at 12 noon.   Yes Historical Provider, MD  hydrochlorothiazide (HYDRODIURIL) 25 MG tablet Take 25 mg by mouth every morning.   Yes Historical Provider, MD  sucralfate (CARAFATE) 1 G tablet Take 1 tablet (1 g total) by mouth 4 (four) times daily -  with meals and at bedtime. 11/29/13   Richarda Blade, MD   BP 107/66  Pulse 58  Temp(Src) 98.9 F (37.2 C) (Oral)  Resp 18  SpO2 100%  LMP 11/12/2013 Physical Exam  Nursing note and vitals reviewed. Constitutional: She is oriented to person, place, and time. She appears well-developed and well-nourished.  HENT:  Head: Normocephalic and atraumatic.  Eyes: Conjunctivae and EOM are normal. Pupils are equal, round, and reactive to light.  Neck: Normal range of motion and phonation normal. Neck supple.  Cardiovascular: Normal rate and regular rhythm.   Pulmonary/Chest: Effort normal and breath sounds normal. She exhibits no tenderness.  Abdominal: Soft. She exhibits no distension. There is tenderness (Epigastric, mild). There is no guarding.  Indistinct small subcutaneous nodules, in the abdominal wall of the right upper quadrant. There is no overlying erythema or fluctuance of the region.  Musculoskeletal: Normal range of motion.  Neurological: She is alert and oriented to person, place, and time. She exhibits normal muscle tone.  Skin: Skin is warm and dry.  Psychiatric: Her behavior is normal. Judgment and thought content normal.  Anxious    ED Course  Procedures (including critical care time)  Medications  0.9 %  sodium chloride infusion ( Intravenous New Bag/Given 11/29/13 1059)  sodium chloride 0.9 % bolus 500 mL (0 mLs Intravenous Stopped 11/29/13 1046)  pantoprazole (PROTONIX) injection 40 mg (40 mg  Intravenous Given 11/29/13 1002)  gi cocktail (Maalox,Lidocaine,Donnatal) (30 mLs Oral Given 11/29/13 1002)    Patient Vitals for the past 24 hrs:  BP Temp Temp src Pulse Resp SpO2  11/29/13 1215 107/66 mmHg - - 58 18 100 %  11/29/13 1145 111/74 mmHg - - 61 20 100 %  11/29/13 1130 100/71 mmHg - - 59 20 100 %  11/29/13 1104 116/82 mmHg - - 76 15 100 %  11/29/13 1100 116/82 mmHg - - 70 15 100 %  11/29/13 1030 104/74 mmHg - - 56 18 100 %  11/29/13 1015 127/86 mmHg - - 71 17 100 %  11/29/13 0945 127/90 mmHg - - 63 23 99 %  11/29/13 0904 117/88 mmHg 98.9 F (37.2 C) Oral 65 20 100 %    12:25 PM Reevaluation with update and discussion. After initial assessment and treatment, an updated evaluation reveals no further c/o, she is comfortable and cooperative. Finding discussed with patient, all questions answered.Daleen Bo L    Labs Review Labs Reviewed  CBC WITH DIFFERENTIAL - Abnormal; Notable for the following:    RBC 3.52 (*)    Hemoglobin 10.3 (*)    HCT 30.8 (*)    RDW 15.8 (*)    Platelets 461 (*)    Neutro Abs 8.1 (*)    All other components within normal limits  URINE RAPID DRUG SCREEN (HOSP PERFORMED) - Abnormal; Notable for the following:    Cocaine POSITIVE (*)    Tetrahydrocannabinol POSITIVE (*)    All other components within normal limits  URINALYSIS, ROUTINE W REFLEX MICROSCOPIC - Abnormal; Notable for the following:    APPearance CLOUDY (*)    Hgb urine dipstick SMALL (*)    Protein, ur 30 (*)    Leukocytes, UA LARGE (*)    All other components within normal limits  URINE MICROSCOPIC-ADD ON - Abnormal; Notable for the following:    Bacteria, UA MANY (*)    All other components within normal limits  URINE CULTURE  ETHANOL  LIPASE, BLOOD  PREGNANCY, URINE    Imaging Review No results found.   EKG Interpretation None      MDM   Final diagnoses:  Cocaine abuse  Tobacco abuse  Gastritis    Nonspecific abdominal pain, with ongoing abuse of  tobacco and cocaine. Both of these products are counterproductive to gastrointestinal. There is no indication for her decompensated hepatic state, pancreatitis, severe, infection, or metabolic instability. She had a UTI. Culture, urine has been ordered. She has access to followup her, with her primary care provider.   Nursing Notes Reviewed/ Care Coordinated Applicable Imaging Reviewed Interpretation of Laboratory Data incorporated into ED treatment  The patient appears reasonably screened and/or stabilized for discharge and I doubt any other medical condition or other [facility] requiring further screening, evaluation, or treatment  in the ED at this time prior to discharge.  Plan: Home Medications- Carafate; Home Treatments- rest, eat well; return here if the recommended treatment, does not improve the symptoms; Recommended follow up- PCP 1 week    Richarda Blade, MD 11/29/13 1226

## 2013-12-01 ENCOUNTER — Telehealth (HOSPITAL_BASED_OUTPATIENT_CLINIC_OR_DEPARTMENT_OTHER): Payer: Self-pay | Admitting: Emergency Medicine

## 2013-12-01 LAB — URINE CULTURE: Colony Count: >=100000

## 2013-12-01 NOTE — Telephone Encounter (Signed)
Post ED Visit - Positive Culture Follow-up  Culture report reviewed by antimicrobial stewardship pharmacist: []  Wes Dulaney, Pharm.D., BCPS []  Heide Guile, Pharm.D., BCPS []  Alycia Rossetti, Pharm.D., BCPS []  [place], [place].D., BCPS, AAHIVP []  Legrand Como, Pharm.D., BCPS, AAHIVP [x]  Carly Sabat, Pharm.D. []  Elenor Quinones, Pharm.D.  Positive urine culture >100,000 colonies/ml Staphylococcus Treated with none, asymptomatic,  no further patient follow-up is required at this time.  Hazle Nordmann 12/01/2013, 11:53 AM

## 2014-06-24 ENCOUNTER — Emergency Department (HOSPITAL_COMMUNITY)
Admission: EM | Admit: 2014-06-24 | Discharge: 2014-06-24 | Disposition: A | Discharge status: Home / Self Care | Payer: Self-pay | Attending: Emergency Medicine | Admitting: Emergency Medicine

## 2014-06-24 ENCOUNTER — Encounter (HOSPITAL_COMMUNITY): Payer: Self-pay | Admitting: *Deleted

## 2014-06-24 DIAGNOSIS — R202 Paresthesia of skin: Secondary | ICD-10-CM | POA: Insufficient documentation

## 2014-06-24 DIAGNOSIS — R21 Rash and other nonspecific skin eruption: Secondary | ICD-10-CM | POA: Insufficient documentation

## 2014-06-24 DIAGNOSIS — H578 Other specified disorders of eye and adnexa: Secondary | ICD-10-CM | POA: Insufficient documentation

## 2014-06-24 DIAGNOSIS — Z3202 Encounter for pregnancy test, result negative: Secondary | ICD-10-CM | POA: Insufficient documentation

## 2014-06-24 DIAGNOSIS — F419 Anxiety disorder, unspecified: Secondary | ICD-10-CM | POA: Insufficient documentation

## 2014-06-24 DIAGNOSIS — Z720 Tobacco use: Secondary | ICD-10-CM | POA: Insufficient documentation

## 2014-06-24 DIAGNOSIS — Z79899 Other long term (current) drug therapy: Secondary | ICD-10-CM | POA: Insufficient documentation

## 2014-06-24 DIAGNOSIS — K219 Gastro-esophageal reflux disease without esophagitis: Secondary | ICD-10-CM | POA: Insufficient documentation

## 2014-06-24 DIAGNOSIS — L299 Pruritus, unspecified: Secondary | ICD-10-CM | POA: Insufficient documentation

## 2014-06-24 DIAGNOSIS — I10 Essential (primary) hypertension: Secondary | ICD-10-CM | POA: Insufficient documentation

## 2014-06-24 LAB — POC URINE PREG, ED: Preg Test, Ur: NEGATIVE

## 2014-06-24 LAB — WET PREP, GENITAL
TRICH WET PREP: NONE SEEN
YEAST WET PREP: NONE SEEN

## 2014-06-24 MED ORDER — HYDROXYZINE HCL 25 MG PO TABS
25.0000 mg | ORAL_TABLET | Freq: Once | ORAL | Status: AC
  Administered 2014-06-24: 25 mg via ORAL
  Filled 2014-06-24: qty 1

## 2014-06-24 MED ORDER — HYDROXYZINE HCL 25 MG PO TABS: 25.0000 mg | ORAL_TABLET | Freq: Three times a day (TID) | ORAL | Status: DC | PRN

## 2014-06-24 NOTE — ED Provider Notes (Signed)
CSN: [id number]     Arrival date & time 06/24/14  1712 History   First MD Initiated Contact with Patient 06/24/14 1745     Chief Complaint  Patient presents with  . Pruritis     (Consider location/radiation/quality/duration/timing/severity/associated sxs/prior Treatment) Patient is a 39 y.o. female presenting with rash. The history is provided by the patient. No language interpreter was used.  Rash Location:  Full body Quality: itchiness   Quality: not blistering, not bruising, not burning, not painful, not peeling, not red, not scaling and not weeping   Severity:  Moderate Onset quality:  Gradual Timing:  Intermittent Progression:  Waxing and waning Chronicity:  New Context: animal contact   Context: not exposure to similar rash   Relieved by: showering. Ineffective treatments:  None tried Associated symptoms: no abdominal pain, no diarrhea, no fatigue, no fever, no headaches, no myalgias, no nausea, no shortness of breath, no URI, not vomiting and not wheezing     Past Medical History  Diagnosis Date  . History of cocaine abuse 09/28/2010  . Hypertension   . GERD (gastroesophageal reflux disease)   . IBS (irritable bowel syndrome)   . Depression   . Mood swings    Past Surgical History  Procedure Laterality Date  . Appendectomy    . Cholecystectomy    . Tubal ligation    . Tonsillectomy     Family History  Problem Relation Age of Onset  . Hypertension Mother   . Heart disease Father 94    MI  . Stroke Father 72  . Diabetes Father   . GER disease Sister   . Cancer Maternal Grandmother     uterine and stomach   History  Substance Use Topics  . Smoking status: Current Every Day Smoker -- 0.30 packs/day    Types: Cigarettes  . Smokeless tobacco: Not on file  . Alcohol Use: Yes     Comment: occ   OB History    No data available     Review of Systems  Constitutional: Negative for fever and fatigue.  HENT: Negative for dental problem, drooling, facial  swelling, rhinorrhea, sinus pressure, trouble swallowing and voice change.   Eyes: Positive for discharge. Negative for redness and visual disturbance.  Respiratory: Negative for cough, chest tightness, shortness of breath and wheezing.   Cardiovascular: Negative for chest pain.  Gastrointestinal: Negative for nausea, vomiting, abdominal pain and diarrhea.       Rectal itching  Endocrine: Negative for cold intolerance and heat intolerance.  Genitourinary: Negative for dysuria, flank pain, genital sores and pelvic pain.       Vaginal itching  Musculoskeletal: Negative for myalgias.  Skin: Positive for rash.  Neurological: Negative for weakness, light-headedness and headaches.  Psychiatric/Behavioral: Negative for confusion. The patient is nervous/anxious.   All other systems reviewed and are negative.     Allergies  Hydrocodone  Home Medications   Prior to Admission medications   Medication Sig Start Date End Date Taking? Authorizing Provider  calcium carbonate (TUMS - DOSED IN MG ELEMENTAL CALCIUM) 500 MG chewable tablet Chew 2 tablets by mouth 2 (two) times daily as needed for heartburn.    Historical Provider, MD  esomeprazole (NEXIUM) 40 MG capsule Take 40 mg by mouth daily at 12 noon.    Historical Provider, MD  hydrochlorothiazide (HYDRODIURIL) 25 MG tablet Take 25 mg by mouth every morning.    Historical Provider, MD  sucralfate (CARAFATE) 1 G tablet Take 1 tablet (1 g total) by  mouth 4 (four) times daily -  with meals and at bedtime. 11/29/13   Daleen Bo, MD    BP 127/91 mmHg  Pulse 95  Temp(Src) 98.8 F (37.1 C) (Oral)  Resp 18  Ht 5\' 1"  (1.549 m)  Wt 160 lb (72.576 kg)  BMI 30.25 kg/m2  SpO2 100%   Physical Exam  Constitutional: She appears well-developed and well-nourished. No distress.  HENT:  Head: Normocephalic and atraumatic.  Nose: Nose normal.  Mouth/Throat: Oropharynx is clear and moist. No oropharyngeal exudate.  HEENT exam benign. No scalp dryness,  no burrows, no eggs or knits seen, no erythema.    Eyes: EOM are normal. Pupils are equal, round, and reactive to light.  No eye discharge noted  Neck: Normal range of motion. Neck supple.  Cardiovascular: Normal rate, regular rhythm, normal heart sounds and intact distal pulses.   No murmur heard. Pulmonary/Chest: Effort normal and breath sounds normal. No respiratory distress. She has no wheezes. She exhibits no tenderness.  Abdominal: Soft. There is no tenderness. There is no rebound and no guarding.  Genitourinary: Vagina normal. No vaginal discharge (scant physiologic white discharge) found.  Normal appearing vaginal and rectal inspection.  No surrounding rash, no burrows, no eggs, no worms noted  Musculoskeletal: Normal range of motion. She exhibits no tenderness.  Lymphadenopathy:    She has no cervical adenopathy.  Neurological: She is alert. No cranial nerve deficit. Coordination normal.  Skin: Skin is warm and dry. No rash noted. She is not diaphoretic. No erythema. No pallor.  Psychiatric: Her behavior is normal. Judgment and thought content normal. Her mood appears anxious. Cognition and memory are normal.  Rapid speech and mildly anxious, but redirectable and not pressured.  Denies SI, HI, AVH  Nursing note and vitals reviewed.   ED Course  Procedures (including critical care time) Labs Review Labs Reviewed  WET PREP, GENITAL - Abnormal; Notable for the following:    Clue Cells Wet Prep HPF POC FEW (*)    WBC, Wet Prep HPF POC MODERATE (*)    All other components within normal limits  POC URINE PREG, ED  GC/CHLAMYDIA PROBE AMP ()    Imaging Review No results found.   EKG Interpretation None      MDM   Final diagnoses:  Itching  Formication   Pt is a 60 F with hx of HTN, cocaine abuse, IBS, and depression who presents with concern for possible worm infestation.  Patient reports the sensation of things crawling on her scalp and falling down into her  eyes, vaginal itching, and rectal itching for the past 2 weeks since she started caring for 2 small dogs.  She is convinced that her symptoms are due to pinworms.  The itching resolves with showering and putting on pajamas.  Does not worsen at night, is not associated with hot temperatures, she states that her husband does not have these sx, and her mom who stays with them occasionally is also asymptomatic.  Patient also believes that a sore throat that she had last week was associated with the parasites and she has subsequently developed hyperpigmented gums that she "never had before".  Patient endorses a bloating abdomen and thinks it is due to "the worms growing in my abdomen".   History of cocaine abuse, but denies any recent drug use including cocaine, methamphetamines, PCP, or synthetic THC.   Speaking somewhat rapidly, but pleasant and re-directable.  No tachycardia.   Patient's HEENT exam is benign. No scalp  dryness, no burrows, no eggs or knits seen, no erythema.     Pelvic exam benign.  No evidence of burrows, no excoriations by vagina or rectum.  No visible worms anywhere.   Given hydroxyzine for symptomatic control of her pruritis.    Wet prep with few clue cells, but clinically treatment is not indicated.   No evidence on exam that patient has an infestation.  Likely just formications.   Will dc home with vistaril and education on skin hydration/lotion use.  Advised f/u with PCP as needed if sx continue.  Although patient was anxious and had somewhat rapid speech, do not think that it was acutely pressured and believe she is safe for dc home.  She was encouraged to avoid any illicit drug use and she voiced agreement. All questions were answered and she was discharged home in stable condition.    Patient was seen with ED Attending, Dr. Estelle Grumbles, MD  Tori Milks, MD 06/25/14 6283  Ripley Fraise, MD 06/26/14 [phone]

## 2014-06-24 NOTE — ED Notes (Signed)
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 Pt states she has been pulling little white "maggot" like things that are gooey out of her scalp and around her eyes. Pt reports that her fingernails and toe nails have changed colors. She reports itching everywhere and a fever for the past few days, no fever today.

## 2014-06-24 NOTE — ED Provider Notes (Signed)
Patient seen/examined in the Emergency Department in conjunction with Resident Physician Provider Joya Gaskins Patient reports she has possible pinworms Exam : awake/alert, no distress.  No visible pinworms to face/chest/back Plan: d/c home   Ripley Fraise, MD 06/24/14 [phone]

## 2014-06-24 NOTE — ED Notes (Signed)
Pt reports being around two dogs, for two days is having severe itching all over, her eyes are burning and itching. She reports finding white parasites around her eyes, hairs and ears. Has vaginal and rectal itching, thinks its from a parasite. Also had recent fever.

## 2014-06-27 LAB — GC/CHLAMYDIA PROBE AMP (~~LOC~~) NOT AT ARMC
CHLAMYDIA, DNA PROBE: NEGATIVE
NEISSERIA GONORRHEA: NEGATIVE

## 2014-07-01 ENCOUNTER — Telehealth (HOSPITAL_BASED_OUTPATIENT_CLINIC_OR_DEPARTMENT_OTHER): Payer: Self-pay | Admitting: Emergency Medicine

## 2014-07-21 ENCOUNTER — Encounter (HOSPITAL_COMMUNITY): Payer: Self-pay | Admitting: Emergency Medicine

## 2014-07-21 ENCOUNTER — Emergency Department (HOSPITAL_COMMUNITY)
Admission: EM | Admit: 2014-07-21 | Discharge: 2014-07-21 | Disposition: A | Discharge status: Home / Self Care | Payer: Self-pay | Attending: Emergency Medicine | Admitting: Emergency Medicine

## 2014-07-21 DIAGNOSIS — Z79899 Other long term (current) drug therapy: Secondary | ICD-10-CM | POA: Insufficient documentation

## 2014-07-21 DIAGNOSIS — Z9851 Tubal ligation status: Secondary | ICD-10-CM | POA: Insufficient documentation

## 2014-07-21 DIAGNOSIS — Z720 Tobacco use: Secondary | ICD-10-CM | POA: Insufficient documentation

## 2014-07-21 DIAGNOSIS — Z9049 Acquired absence of other specified parts of digestive tract: Secondary | ICD-10-CM | POA: Insufficient documentation

## 2014-07-21 DIAGNOSIS — K219 Gastro-esophageal reflux disease without esophagitis: Secondary | ICD-10-CM | POA: Insufficient documentation

## 2014-07-21 DIAGNOSIS — R21 Rash and other nonspecific skin eruption: Secondary | ICD-10-CM | POA: Insufficient documentation

## 2014-07-21 DIAGNOSIS — I10 Essential (primary) hypertension: Secondary | ICD-10-CM | POA: Insufficient documentation

## 2014-07-21 DIAGNOSIS — Z9089 Acquired absence of other organs: Secondary | ICD-10-CM | POA: Insufficient documentation

## 2014-07-21 DIAGNOSIS — Z8659 Personal history of other mental and behavioral disorders: Secondary | ICD-10-CM | POA: Insufficient documentation

## 2014-07-21 LAB — CBC WITH DIFFERENTIAL/PLATELET
Basophils Absolute: 0 10*3/uL (ref 0.0–0.1)
Basophils Relative: 0 % (ref 0–1)
Eosinophils Absolute: 0.1 10*3/uL (ref 0.0–0.7)
Eosinophils Relative: 1 % (ref 0–5)
HCT: 34.6 % — ABNORMAL LOW (ref 36.0–46.0)
Hemoglobin: 11.1 g/dL — ABNORMAL LOW (ref 12.0–15.0)
LYMPHS ABS: 2.5 10*3/uL (ref 0.7–4.0)
LYMPHS PCT: 23 % (ref 12–46)
MCH: 27.7 pg (ref 26.0–34.0)
MCHC: 32.1 g/dL (ref 30.0–36.0)
MCV: 86.3 fL (ref 78.0–100.0)
Monocytes Absolute: 0.6 10*3/uL (ref 0.1–1.0)
Monocytes Relative: 6 % (ref 3–12)
NEUTROS ABS: 7.4 10*3/uL (ref 1.7–7.7)
NEUTROS PCT: 70 % (ref 43–77)
PLATELETS: 485 10*3/uL — AB (ref 150–400)
RBC: 4.01 MIL/uL (ref 3.87–5.11)
RDW: 16 % — ABNORMAL HIGH (ref 11.5–15.5)
WBC: 10.6 10*3/uL — AB (ref 4.0–10.5)

## 2014-07-21 LAB — COMPREHENSIVE METABOLIC PANEL
ALBUMIN: 4 g/dL (ref 3.5–5.0)
ALT: 11 U/L — ABNORMAL LOW (ref 14–54)
AST: 16 U/L (ref 15–41)
Alkaline Phosphatase: 64 U/L (ref 38–126)
Anion gap: 12 (ref 5–15)
BUN: 8 mg/dL (ref 6–20)
CHLORIDE: 100 mmol/L — AB (ref 101–111)
CO2: 23 mmol/L (ref 22–32)
CREATININE: 0.69 mg/dL (ref 0.44–1.00)
Calcium: 9.1 mg/dL (ref 8.9–10.3)
GFR calc Af Amer: >60 mL/min (ref >60)
GFR calc non Af Amer: >60 mL/min (ref >60)
Glucose, Bld: 87 mg/dL (ref 65–99)
Potassium: 3.6 mmol/L (ref 3.5–5.1)
Sodium: 135 mmol/L (ref 135–145)
Total Bilirubin: 0.4 mg/dL (ref 0.3–1.2)
Total Protein: 7.7 g/dL (ref 6.5–8.1)

## 2014-07-21 LAB — LIPASE, BLOOD: Lipase: 29 U/L (ref 22–51)

## 2014-07-21 MED ORDER — OXYCODONE-ACETAMINOPHEN 5-325 MG PO TABS
2.0000 | ORAL_TABLET | Freq: Once | ORAL | Status: AC
  Administered 2014-07-21: 2 via ORAL
  Filled 2014-07-21: qty 2

## 2014-07-21 MED ORDER — PERMETHRIN 5 % EX CREA: TOPICAL_CREAM | CUTANEOUS | Status: DC

## 2014-07-21 MED ORDER — ONDANSETRON 8 MG PO TBDP
8.0000 mg | ORAL_TABLET | Freq: Once | ORAL | Status: AC
  Administered 2014-07-21: 8 mg via ORAL
  Filled 2014-07-21: qty 1

## 2014-07-21 NOTE — ED Provider Notes (Signed)
CSN: [id number]     Arrival date & time 07/21/14  7672 History   First MD Initiated Contact with Patient 07/21/14 0559     Chief Complaint  Patient presents with  . Rash     (Consider location/radiation/quality/duration/timing/severity/associated sxs/prior Treatment) HPI Comments: Patient with PMH of cocaine abuse, GERD, IBS presents to the ED with a chief complaint of itching and pain.  Patient states that she has noticed multiple bites on her skin for the past week.  She denies seeing anything bite her.  She has tried taking atarax with no relief.  She states that the bites and painful and pruritic.  She reports subjective fever.  Additionally, she states that her acid reflux is flaring up.  There are no aggravating or alleviating factors.  She states "that if feels like my organs are deteriorating." She has a hx of drug abuse.  The history is provided by the patient. No language interpreter was used.    Past Medical History  Diagnosis Date  . History of cocaine abuse 09/28/2010  . Hypertension   . GERD (gastroesophageal reflux disease)   . IBS (irritable bowel syndrome)   . Depression   . Mood swings    Past Surgical History  Procedure Laterality Date  . Appendectomy    . Cholecystectomy    . Tubal ligation    . Tonsillectomy     Family History  Problem Relation Age of Onset  . Hypertension Mother   . Heart disease Father 79    MI  . Stroke Father 87  . Diabetes Father   . GER disease Sister   . Cancer Maternal Grandmother     uterine and stomach   History  Substance Use Topics  . Smoking status: Current Every Day Smoker -- 0.30 packs/day    Types: Cigarettes  . Smokeless tobacco: Not on file  . Alcohol Use: Yes     Comment: occ   OB History    No data available     Review of Systems  Constitutional: Negative for fever and chills.  Respiratory: Negative for shortness of breath.   Cardiovascular: Negative for chest pain.  Gastrointestinal: Negative for  nausea, vomiting, diarrhea and constipation.  Genitourinary: Negative for dysuria.  Skin: Positive for rash and wound.  All other systems reviewed and are negative.     Allergies  Hydrocodone  Home Medications   Prior to Admission medications   Medication Sig Start Date End Date Taking? Authorizing Provider  [place] alk-PHENObarbital (DONNATAL) 16.2 MG tablet Take 1 tablet by mouth every 8 (eight) hours as needed (stomach pain).   Yes Historical Provider, MD  calcium carbonate (TUMS - DOSED IN MG ELEMENTAL CALCIUM) 500 MG chewable tablet Chew 2 tablets by mouth 2 (two) times daily as needed for heartburn.   Yes Historical Provider, MD  hydrochlorothiazide (HYDRODIURIL) 25 MG tablet Take 25 mg by mouth every morning.   Yes Historical Provider, MD  oxyCODONE-acetaminophen (PERCOCET) 7.5-325 MG per tablet Take 2 tablets by mouth every 4 (four) hours as needed for moderate pain or severe pain.   Yes Historical Provider, MD  hydrOXYzine (ATARAX/VISTARIL) 25 MG tablet Take 1 tablet (25 mg total) by mouth every 8 (eight) hours as needed. Patient not taking: Reported on 07/21/2014 06/24/14   Tori Milks, MD  sucralfate (CARAFATE) 1 G tablet Take 1 tablet (1 g total) by mouth 4 (four) times daily -  with meals and at bedtime. Patient not taking: Reported on 07/21/2014 11/29/13  Daleen Bo, MD   BP 157/111 mmHg  Pulse 91  Temp(Src) 98.2 F (36.8 C) (Oral)  Resp 20  SpO2 100%  LMP 07/03/2014 (Approximate) Physical Exam  Constitutional: She is oriented to person, place, and time. She appears well-developed and well-nourished.  HENT:  Head: Normocephalic and atraumatic.  Eyes: Conjunctivae and EOM are normal. Pupils are equal, round, and reactive to light.  Neck: Normal range of motion. Neck supple.  Cardiovascular: Normal rate and regular rhythm.  Exam reveals no gallop and no friction rub.   No murmur heard. Pulmonary/Chest: Effort normal and breath sounds normal. No respiratory  distress. She has no wheezes. She has no rales. She exhibits no tenderness.  Abdominal: Soft. Bowel sounds are normal. She exhibits no distension and no mass. There is no tenderness. There is no rebound and no guarding.  Musculoskeletal: Normal range of motion. She exhibits no edema or tenderness.  Neurological: She is alert and oriented to person, place, and time.  Skin: Skin is warm and dry.  Multiple bug bites on extremities, no cellulitis, no discharge or sign of abscess  Psychiatric: She has a normal mood and affect. Her behavior is normal. Judgment and thought content normal.  Nursing note and vitals reviewed.   ED Course  Procedures (including critical care time) Results for orders placed or performed during the hospital encounter of 07/21/14  CBC with Differential/Platelet  Result Value Ref Range   WBC 10.6 (H) 4.0 - 10.5 K/uL   RBC 4.01 3.87 - 5.11 MIL/uL   Hemoglobin 11.1 (L) 12.0 - 15.0 g/dL   HCT 34.6 (L) 36.0 - 46.0 %   MCV 86.3 78.0 - 100.0 fL   MCH 27.7 26.0 - 34.0 pg   MCHC 32.1 30.0 - 36.0 g/dL   RDW 16.0 (H) 11.5 - 15.5 %   Platelets 485 (H) 150 - 400 K/uL   Neutrophils Relative % 70 43 - 77 %   Neutro Abs 7.4 1.7 - 7.7 K/uL   Lymphocytes Relative 23 12 - 46 %   Lymphs Abs 2.5 0.7 - 4.0 K/uL   Monocytes Relative 6 3 - 12 %   Monocytes Absolute 0.6 0.1 - 1.0 K/uL   Eosinophils Relative 1 0 - 5 %   Eosinophils Absolute 0.1 0.0 - 0.7 K/uL   Basophils Relative 0 0 - 1 %   Basophils Absolute 0.0 0.0 - 0.1 K/uL  Comprehensive metabolic panel  Result Value Ref Range   Sodium 135 135 - 145 mmol/L   Potassium 3.6 3.5 - 5.1 mmol/L   Chloride 100 (L) 101 - 111 mmol/L   CO2 23 22 - 32 mmol/L   Glucose, Bld 87 65 - 99 mg/dL   BUN 8 6 - 20 mg/dL   Creatinine, Ser 0.69 0.44 - 1.00 mg/dL   Calcium 9.1 8.9 - 10.3 mg/dL   Total Protein 7.7 6.5 - 8.1 g/dL   Albumin 4.0 3.5 - 5.0 g/dL   AST 16 15 - 41 U/L   ALT 11 (L) 14 - 54 U/L   Alkaline Phosphatase 64 38 - 126 U/L    Total Bilirubin 0.4 0.3 - 1.2 mg/dL   GFR calc non Af Amer >60 >60 mL/min   GFR calc Af Amer >60 >60 mL/min   Anion gap 12 5 - 15  Lipase, blood  Result Value Ref Range   Lipase 29 22 - 51 U/L   No results found.    EKG Interpretation None  MDM   Final diagnoses:  Rash  Gastroesophageal reflux disease, esophagitis presence not specified    Patient with multiple small lesions on arms and legs.  Looks like bedbugs.  No abscess or cellulitis.  Patient seems to also be exhibiting some drug seeking behavior. States that she hurts all over and feels like her organs are "deteriorating," however physical exam is benign.  Will check labs.  Plan for discharge with permethrin.  7:52 AM Labs are reassuring.  Discharge to home with permethrin.  VSS.    Patient repeatedly asks for additional prescription pain medicine.  Patient educated that we do not refill pain medications in the ED.  She will need to follow-up with her PCP or pain management.  She has omeprazole and zantac at home for GERD.      Montine Circle, PA-C 07/21/14 6681  Debby Freiberg, MD 07/22/14 [phone]

## 2014-07-21 NOTE — ED Notes (Signed)
Patient is resting quietly in bed until staff walks in the room, when she begins moaning loudly.

## 2014-07-21 NOTE — Discharge Instructions (Signed)
Gastroesophageal Reflux Disease, Adult Gastroesophageal reflux disease (GERD) happens when acid from your stomach flows up into the esophagus. When acid comes in contact with the esophagus, the acid causes soreness (inflammation) in the esophagus. Over time, GERD may create small holes (ulcers) in the lining of the esophagus. CAUSES   Increased body weight. This puts pressure on the stomach, making acid rise from the stomach into the esophagus.  Smoking. This increases acid production in the stomach.  Drinking alcohol. This causes decreased pressure in the lower esophageal sphincter (valve or ring of muscle between the esophagus and stomach), allowing acid from the stomach into the esophagus.  Late evening meals and a full stomach. This increases pressure and acid production in the stomach.  A malformed lower esophageal sphincter. Sometimes, no cause is found. SYMPTOMS   Burning pain in the lower part of the mid-chest behind the breastbone and in the mid-stomach area. This may occur twice a week or more often.  Trouble swallowing.  Sore throat.  Dry cough.  Asthma-like symptoms including chest tightness, shortness of breath, or wheezing. DIAGNOSIS  Your caregiver may be able to diagnose GERD based on your symptoms. In some cases, X-rays and other tests may be done to check for complications or to check the condition of your stomach and esophagus. TREATMENT  Your caregiver may recommend over-the-counter or prescription medicines to help decrease acid production. Ask your caregiver before starting or adding any new medicines.  HOME CARE INSTRUCTIONS   Change the factors that you can control. Ask your caregiver for guidance concerning weight loss, quitting smoking, and alcohol consumption.  Avoid foods and drinks that make your symptoms worse, such as:  Caffeine or alcoholic drinks.  Chocolate.  Peppermint or mint flavorings.  Garlic and onions.  Spicy foods.  Citrus fruits,  such as oranges, lemons, or limes.  Tomato-based foods such as sauce, chili, salsa, and pizza.  Fried and fatty foods.  Avoid lying down for the 3 hours prior to your bedtime or prior to taking a nap.  Eat small, frequent meals instead of large meals.  Wear loose-fitting clothing. Do not wear anything tight around your waist that causes pressure on your stomach.  Raise the head of your bed 6 to 8 inches with wood blocks to help you sleep. Extra pillows will not help.  Only take over-the-counter or prescription medicines for pain, discomfort, or fever as directed by your caregiver.  Do not take aspirin, ibuprofen, or other nonsteroidal anti-inflammatory drugs (NSAIDs). SEEK IMMEDIATE MEDICAL CARE IF:   You have pain in your arms, neck, jaw, teeth, or back.  Your pain increases or changes in intensity or duration.  You develop nausea, vomiting, or sweating (diaphoresis).  You develop shortness of breath, or you faint.  Your vomit is green, yellow, black, or looks like coffee grounds or blood.  Your stool is red, bloody, or black. These symptoms could be signs of other problems, such as heart disease, gastric bleeding, or esophageal bleeding. MAKE SURE YOU:   Understand these instructions.  Will watch your condition.  Will get help right away if you are not doing well or get worse. Document Released: 12/05/2004 Document Revised: 05/20/2011 Document Reviewed: 09/14/2010 [facility] Patient Information 2015 [place], [place]. This information is not intended to replace advice given to you by your health care provider. Make sure you discuss any questions you have with your health care provider. Rash A rash is a change in the color or texture of your skin. There are  many different types of rashes. You may have other problems that accompany your rash. CAUSES   Infections.  Allergic reactions. This can include allergies to pets or foods.  Certain medicines.  Exposure to certain  chemicals, soaps, or cosmetics.  Heat.  Exposure to poisonous plants.  Tumors, both cancerous and noncancerous. SYMPTOMS   Redness.  Scaly skin.  Itchy skin.  Dry or cracked skin.  Bumps.  Blisters.  Pain. DIAGNOSIS  Your caregiver may do a physical exam to determine what type of rash you have. A skin sample (biopsy) may be taken and examined under a microscope. TREATMENT  Treatment depends on the type of rash you have. Your caregiver may prescribe certain medicines. For serious conditions, you may need to see a skin doctor (dermatologist). HOME CARE INSTRUCTIONS   Avoid the substance that caused your rash.  Do not scratch your rash. This can cause infection.  You may take cool baths to help stop itching.  Only take over-the-counter or prescription medicines as directed by your caregiver.  Keep all follow-up appointments as directed by your caregiver. SEEK IMMEDIATE MEDICAL CARE IF:  You have increasing pain, swelling, or redness.  You have a fever.  You have new or severe symptoms.  You have body aches, diarrhea, or vomiting.  Your rash is not better after 3 days. MAKE SURE YOU:  Understand these instructions.  Will watch your condition.  Will get help right away if you are not doing well or get worse. Document Released: 02/15/2002 Document Revised: 05/20/2011 Document Reviewed: 12/10/2010 [facility] Patient Information 2015 [place], [place]. This information is not intended to replace advice given to you by your health care provider. Make sure you discuss any questions you have with your health care provider. Bedbugs Bedbugs are tiny bugs that live in and around beds. During the day, they hide in mattresses and other places near beds. They come out at night and bite people lying in bed. They need blood to live and grow. Bedbugs can be found in beds anywhere. Usually, they are found in places where many people come and go (hotels, shelters, hospitals). It does  not matter whether the place is dirty or clean. Getting bitten by bedbugs rarely causes a medical problem. The biggest problem can be getting rid of them. This often takes the work of a Financial risk analyst. CAUSES  Less use of pesticides. Bedbugs were common before the 1950s. Then, strong pesticides such as DDT nearly wiped them out. Today, these pesticides are not used because they harm the environment and can cause health problems.  More travel. Besides mattresses, bedbugs can also live in clothing and luggage. They can come along as people travel from place to place. Bedbugs are more common in certain parts of the world. When people travel to those areas, the bugs can come home with them.  Presence of birds and bats. Bedbugs often infest birds and bats. If you have these animals in or near your home, bedbugs may infest your house, too. SYMPTOMS It does not hurt to be bitten by a bedbug. You will probably not wake up when you are bitten. Bedbugs usually bite areas of the skin that are not covered. Symptoms may show when you wake up, or they may take a day or more to show up. Symptoms may include:  Small red bumps on the skin. These might be lined up in a row or clustered in a group.  A darker red dot in the middle of red bumps.  Blisters on the skin. There may be swelling and very bad itching. These may be signs of an allergic reaction. This does not happen often. DIAGNOSIS Bedbug bites might look and feel like other types of insect bites. The bugs do not stay on the body like ticks or lice. They bite, drop off, and crawl away to hide. Your caregiver will probably:  Ask about your symptoms.  Ask about your recent activities and travel.  Check your skin for bedbug bites.  Ask you to check at home for signs of bedbugs. You should look for:  Spots or stains on the bed or nearby. This could be from bedbugs that were crushed or from their eggs or waste.  Bedbugs themselves. They are  reddish-brown, oval, and flat. They do not fly. They are about the size of an apple seed.  Places to look for bedbugs include:  Beds. Check mattresses, headboards, box springs, and bed frames.  On drapes and curtains near the bed.  Under carpeting in the bedroom.  Behind electrical outlets.  Behind any wallpaper that is peeling.  Inside luggage. TREATMENT Most bedbug bites do not need treatment. They usually go away on their own in a few days. The bites are not dangerous. However, treatment may be needed if you have scratched so much that your skin has become infected. You may also need treatment if you are allergic to bedbug bites. Treatment options include:  A drug that stops swelling and itching (corticosteroid). Usually, a cream is rubbed on the skin. If you have a bad rash, you may be given a corticosteroid pill.  Oral antihistamines. These are pills to help control itching.  Antibiotic medicines. An antibiotic may be prescribed for infected skin. HOME CARE INSTRUCTIONS   Take any medicine prescribed by your caregiver for your bites. Follow the directions carefully.  Consider wearing pajamas with long sleeves and pant legs.  Your bedroom may need to be treated. A pest control expert should make sure the bedbugs are gone. You may need to throw away mattresses or luggage. Ask the pest control expert what you can do to keep the bedbugs from coming back. Common suggestions include:  Putting a plastic cover over your mattress.  Washing and drying your clothes and bedding in hot water and a hot dryer. The temperature should be hotter than 120 F (48.9 C). Bedbugs are killed by high temperatures.  Vacuuming carefully all around your bed. Vacuum in all cracks and crevices where the bugs might hide. Do this often.  Carefully checking all used furniture, bedding, or clothes that you bring into your house.  Eliminating bird nests and bat roosts.  If you get bedbug bites when  traveling, check all your possessions carefully before bringing them into your house. If you find any bugs on clothes or in your luggage, consider throwing those items away. SEEK MEDICAL CARE IF:  You have red bug bites that keep coming back.  You have red bug bites that itch badly.  You have bug bites that cause a skin rash.  You have scratch marks that are red and sore. SEEK IMMEDIATE MEDICAL CARE IF: You have a fever. Document Released: 03/30/2010 Document Revised: 05/20/2011 Document Reviewed: 03/30/2010 [facility] Patient Information 2015 [place], [place]. This information is not intended to replace advice given to you by your health care provider. Make sure you discuss any questions you have with your health care provider.

## 2014-07-21 NOTE — ED Notes (Signed)
Prior to d/c, pt had become very demanding, saying she needs a narcotic to treat her GERD symptoms and pain from her bites.  Pt asked if she has a PMD treating her symptoms and said she takes omeprazole for it.  As d/c instructions were given, her female friend had given a compliment, asking if the undersigned was a Pharmacist, hospital.  Pt began to get more agitated, starting to speak more loudly about how every time she comes here she needs a shot of morphine just to touch her pain and that she needs to just sue the hospital for not treating her today.  Reassurance was attempted, but pt was yelling at that point about how everyone talks to her like she is stupid.  Her female friend was calm and tried to ask a question and she yelled at him to shut up and not speak to the undersigned because "I'm starting to get really irritated."  As she walked down the hall it was told by other staff that she was talking really loudly about how she is always treated like she's stupid down the hall.

## 2014-07-21 NOTE — ED Notes (Signed)
Pt states she has a rash that started on the back of her right shoulder and has spread  Pt states she was seen here last week for same and had a work up done including a pelvic exam  Pt was given benadryl and discharged Pt states the rash has spread and has become very painful  Pt states she has had a fever for the past week and has been vomiting for the past 3 days  Pt states she had a near syncopal episode yesterday

## 2014-07-24 ENCOUNTER — Emergency Department (HOSPITAL_COMMUNITY)
Admission: EM | Admit: 2014-07-24 | Discharge: 2014-07-24 | Disposition: A | Discharge status: Home / Self Care | Payer: Self-pay | Attending: Emergency Medicine | Admitting: Emergency Medicine

## 2014-07-24 ENCOUNTER — Encounter (HOSPITAL_COMMUNITY): Payer: Self-pay | Admitting: Adult Health

## 2014-07-24 DIAGNOSIS — R630 Anorexia: Secondary | ICD-10-CM | POA: Insufficient documentation

## 2014-07-24 DIAGNOSIS — Z8659 Personal history of other mental and behavioral disorders: Secondary | ICD-10-CM | POA: Insufficient documentation

## 2014-07-24 DIAGNOSIS — I10 Essential (primary) hypertension: Secondary | ICD-10-CM | POA: Insufficient documentation

## 2014-07-24 DIAGNOSIS — N390 Urinary tract infection, site not specified: Secondary | ICD-10-CM | POA: Insufficient documentation

## 2014-07-24 DIAGNOSIS — K219 Gastro-esophageal reflux disease without esophagitis: Secondary | ICD-10-CM | POA: Insufficient documentation

## 2014-07-24 DIAGNOSIS — E876 Hypokalemia: Secondary | ICD-10-CM | POA: Insufficient documentation

## 2014-07-24 DIAGNOSIS — R21 Rash and other nonspecific skin eruption: Secondary | ICD-10-CM | POA: Insufficient documentation

## 2014-07-24 DIAGNOSIS — Z720 Tobacco use: Secondary | ICD-10-CM | POA: Insufficient documentation

## 2014-07-24 DIAGNOSIS — N938 Other specified abnormal uterine and vaginal bleeding: Secondary | ICD-10-CM | POA: Insufficient documentation

## 2014-07-24 LAB — URINALYSIS, ROUTINE W REFLEX MICROSCOPIC
Glucose, UA: NEGATIVE mg/dL
KETONES UR: 15 mg/dL — AB
NITRITE: NEGATIVE
Protein, ur: 30 mg/dL — AB
SPECIFIC GRAVITY, URINE: 1.038 — AB (ref 1.005–1.030)
Urobilinogen, UA: 1 mg/dL (ref 0.0–1.0)
pH: 6 (ref 5.0–8.0)

## 2014-07-24 LAB — I-STAT CHEM 8, ED
BUN: 7 mg/dL (ref 6–20)
CALCIUM ION: 1.21 mmol/L (ref 1.12–1.23)
CREATININE: 0.6 mg/dL (ref 0.44–1.00)
Chloride: 102 mmol/L (ref 101–111)
Glucose, Bld: 132 mg/dL — ABNORMAL HIGH (ref 65–99)
HEMATOCRIT: 38 % (ref 36.0–46.0)
Hemoglobin: 12.9 g/dL (ref 12.0–15.0)
POTASSIUM: 3.2 mmol/L — AB (ref 3.5–5.1)
SODIUM: 139 mmol/L (ref 135–145)
TCO2: 21 mmol/L (ref 0–100)

## 2014-07-24 LAB — URINE MICROSCOPIC-ADD ON

## 2014-07-24 MED ORDER — SODIUM CHLORIDE 0.9 % IV BOLUS (SEPSIS)
1000.0000 mL | Freq: Once | INTRAVENOUS | Status: AC
  Administered 2014-07-24: 1000 mL via INTRAVENOUS

## 2014-07-24 MED ORDER — KETOROLAC TROMETHAMINE 30 MG/ML IJ SOLN
30.0000 mg | Freq: Once | INTRAMUSCULAR | Status: AC
  Administered 2014-07-24: 30 mg via INTRAVENOUS

## 2014-07-24 MED ORDER — CEFTRIAXONE SODIUM 1 G IJ SOLR
1.0000 g | Freq: Once | INTRAMUSCULAR | Status: AC
  Administered 2014-07-24: 1 g via INTRAVENOUS
  Filled 2014-07-24: qty 10

## 2014-07-24 MED ORDER — CEPHALEXIN 500 MG PO CAPS: 500.0000 mg | ORAL_CAPSULE | Freq: Two times a day (BID) | ORAL | Status: DC

## 2014-07-24 NOTE — ED Notes (Signed)
Pt sleeping comfortably upon nurse's entry into room, when woken by nurse pt began to cry in pain.

## 2014-07-24 NOTE — ED Notes (Addendum)
Presents with open wounds to arms, hands, back and legs-began after a bug bit her a week ago-she c/o pain all over and inability to eat since mother's day. She was seen at [facility] for same on 5/12. She states, "these bumps come up on my sking and they move and keep moving and then they popo open and there is a black thing or bug inside of it, and I can feel it crawling under my skin" reports pain everywhere-worse in stomach and head. She is tearful-endorses heavy vaginal bleeding with sex and tissue that comes out-she also says she is coughing up egg sacks.

## 2014-07-24 NOTE — Discharge Instructions (Signed)
You were seen today for rash and several other symptoms. The rash is uncharacteristic of any other rash. You need to follow-up with a dermatologist. You should follow-up with her primary physician for referral to dermatology. You were incidentally found to have a urinary tract infection. You'll be given antibiotics.  Urinary Tract Infection Urinary tract infections (UTIs) can develop anywhere along your urinary tract. Your urinary tract is your body's drainage system for removing wastes and extra water. Your urinary tract includes two kidneys, two ureters, a bladder, and a urethra. Your kidneys are a pair of bean-shaped organs. Each kidney is about the size of your fist. They are located below your ribs, one on each side of your spine. CAUSES Infections are caused by microbes, which are microscopic organisms, including fungi, viruses, and bacteria. These organisms are so small that they can only be seen through a microscope. Bacteria are the microbes that most commonly cause UTIs. SYMPTOMS  Symptoms of UTIs may vary by age and gender of the patient and by the location of the infection. Symptoms in young women typically include a frequent and intense urge to urinate and a painful, burning feeling in the bladder or urethra during urination. Older women and men are more likely to be tired, shaky, and weak and have muscle aches and abdominal pain. A fever may mean the infection is in your kidneys. Other symptoms of a kidney infection include pain in your back or sides below the ribs, nausea, and vomiting. DIAGNOSIS To diagnose a UTI, your caregiver will ask you about your symptoms. Your caregiver also will ask to provide a urine sample. The urine sample will be tested for bacteria and white blood cells. White blood cells are made by your body to help fight infection. TREATMENT  Typically, UTIs can be treated with medication. Because most UTIs are caused by a bacterial infection, they usually can be treated  with the use of antibiotics. The choice of antibiotic and length of treatment depend on your symptoms and the type of bacteria causing your infection. HOME CARE INSTRUCTIONS  If you were prescribed antibiotics, take them exactly as your caregiver instructs you. Finish the medication even if you feel better after you have only taken some of the medication.  Drink enough water and fluids to keep your urine clear or pale yellow.  Avoid caffeine, tea, and carbonated beverages. They tend to irritate your bladder.  Empty your bladder often. Avoid holding urine for long periods of time.  Empty your bladder before and after sexual intercourse.  After a bowel movement, women should cleanse from front to back. Use each tissue only once. SEEK MEDICAL CARE IF:   You have back pain.  You develop a fever.  Your symptoms do not begin to resolve within 3 days. SEEK IMMEDIATE MEDICAL CARE IF:   You have severe back pain or lower abdominal pain.  You develop chills.  You have nausea or vomiting.  You have continued burning or discomfort with urination. MAKE SURE YOU:   Understand these instructions.  Will watch your condition.  Will get help right away if you are not doing well or get worse. Document Released: 12/05/2004 Document Revised: 08/27/2011 Document Reviewed: 04/05/2011 [facility] Patient Information 2015 [place], [place]. This information is not intended to replace advice given to you by your health care provider. Make sure you discuss any questions you have with your health care provider.

## 2014-07-24 NOTE — ED Notes (Signed)
Pt falling asleep during discharge instructions.

## 2014-07-24 NOTE — ED Notes (Signed)
MD at bedside. 

## 2014-07-24 NOTE — ED Notes (Signed)
Pt states she can feel bugs moving under skin, RN felt area pt indicated and felt possible hematoma, no movement. Pt states she's having syncopal episodes and abdominal pain, states it all started about a month ago from a bite to her neck.

## 2014-07-24 NOTE — ED Notes (Signed)
Pt hooked up to the monitor with BP cuff and pulse ox

## 2014-07-24 NOTE — ED Provider Notes (Signed)
CSN: [id number]     Arrival date & time 07/24/14  0153 History  This chart was scribed for Gail Hacker, MD by Evelene Croon, ED Scribe. This patient was seen in room D30C/D30C and the patient's care was started 3:28 AM.    Chief Complaint  Patient presents with  . Rash   The history is provided by the patient. No language interpreter was used.    HPI Comments:  [name] is a 39 y.o. female brought in by ambulance, who presents to the Emergency Department complaining of wounds to her back, posterior neck, and BUE.Pt states she was bitten by an insect on 07/14/14 while seated on the porch. She was evaluated and given a cream which she applied to wounds without relief.  Pt reports associated fever of 105 for 4 days following initial bite, states fever resolved on its own. She states there are now bugs coming out of the holes. She also complains moderate pain with swallowing food and water, mid abdominal pain and decreased PO intake-states she has not eaten since 07/17/14. No alleviating factors noted.  Patient also reports abnormal uterine bleeding. Reports that she developed bleeding yesterday not associated with a normal period. Does not think she can be pregnant.  Of note, patient has been evaluated twice in the ER with concerns for bugs or warms coming out of wounds. She is been treated with permethrin for bedbugs.   Past Medical History  Diagnosis Date  . History of cocaine abuse 09/28/2010  . Hypertension   . GERD (gastroesophageal reflux disease)   . IBS (irritable bowel syndrome)   . Depression   . Mood swings    Past Surgical History  Procedure Laterality Date  . Appendectomy    . Cholecystectomy    . Tubal ligation    . Tonsillectomy     Family History  Problem Relation Age of Onset  . Hypertension Mother   . Heart disease Father 64    MI  . Stroke Father 54  . Diabetes Father   . GER disease Sister   . Cancer Maternal Grandmother     uterine and stomach    History  Substance Use Topics  . Smoking status: Current Every Day Smoker -- 0.30 packs/day    Types: Cigarettes  . Smokeless tobacco: Not on file  . Alcohol Use: Yes     Comment: occ   OB History    No data available     Review of Systems  Constitutional: Positive for fever and appetite change.  Respiratory: Negative for chest tightness and shortness of breath.   Cardiovascular: Negative for chest pain.  Gastrointestinal: Positive for abdominal pain. Negative for nausea and vomiting.  Genitourinary: Negative for dysuria.  Musculoskeletal: Negative for back pain.  Skin: Positive for rash and wound.  Neurological: Negative for headaches.  Psychiatric/Behavioral: Negative for confusion.  All other systems reviewed and are negative.     Allergies  Hydrocodone  Home Medications   Prior to Admission medications   Medication Sig Start Date End Date Taking? Authorizing Provider  [place] alk-PHENObarbital (DONNATAL) 16.2 MG tablet Take 1 tablet by mouth every 8 (eight) hours as needed (stomach pain).    Historical Provider, MD  calcium carbonate (TUMS - DOSED IN MG ELEMENTAL CALCIUM) 500 MG chewable tablet Chew 2 tablets by mouth 2 (two) times daily as needed for heartburn.    Historical Provider, MD  cephALEXin (KEFLEX) 500 MG capsule Take 1 capsule (500 mg total) by mouth  2 (two) times daily. 07/24/14   Gail Hacker, MD  hydrochlorothiazide (HYDRODIURIL) 25 MG tablet Take 25 mg by mouth every morning.    Historical Provider, MD  hydrOXYzine (ATARAX/VISTARIL) 25 MG tablet Take 1 tablet (25 mg total) by mouth every 8 (eight) hours as needed. Patient not taking: Reported on 07/21/2014 06/24/14   Tori Milks, MD  oxyCODONE-acetaminophen (PERCOCET) 7.5-325 MG per tablet Take 2 tablets by mouth every 4 (four) hours as needed for moderate pain or severe pain.    Historical Provider, MD  permethrin (ELIMITE) 5 % cream Apply to entire body other than face - let sit for 14 hours  then wash off, may repeat in 1 week if still having symptoms 07/21/14   Montine Circle, PA-C  sucralfate (CARAFATE) 1 G tablet Take 1 tablet (1 g total) by mouth 4 (four) times daily -  with meals and at bedtime. Patient not taking: Reported on 07/21/2014 11/29/13   Daleen Bo, MD   BP 105/71 mmHg  Pulse 79  Temp(Src) 98.4 F (36.9 C) (Oral)  Resp 16  Ht 5' 1"  (1.549 m)  SpO2 98%  LMP 07/14/2014 Physical Exam  Constitutional: She is oriented to person, place, and time. She appears well-developed and well-nourished. No distress.  HENT:  Head: Normocephalic and atraumatic.  Cardiovascular: Normal rate, regular rhythm and normal heart sounds.   No murmur heard. Pulmonary/Chest: Effort normal and breath sounds normal. No respiratory distress. She has no wheezes.  Abdominal: Soft. Bowel sounds are normal. There is no tenderness. There is no rebound.  Neurological: She is alert and oriented to person, place, and time.  Skin: Skin is warm and dry.  Patient with multiple less than 1/2 cm areas of superficial skin defects located over the neck, right back, right abdomen, bilateral upper extremities, no adjacent erythema, no obvious infestations noted  Psychiatric:  Strange affect  Nursing note and vitals reviewed.   ED Course  Procedures   DIAGNOSTIC STUDIES:  Oxygen Saturation is 100% on RA, normal by my interpretation.    COORDINATION OF CARE:  3:36 AM Will order blood work. Discussed treatment plan with pt at bedside and pt agreed to plan.  Labs Review Labs Reviewed  URINALYSIS, ROUTINE W REFLEX MICROSCOPIC - Abnormal; Notable for the following:    APPearance CLOUDY (*)    Specific Gravity, Urine 1.038 (*)    Hgb urine dipstick LARGE (*)    Bilirubin Urine SMALL (*)    Ketones, ur 15 (*)    Protein, ur 30 (*)    Leukocytes, UA LARGE (*)    All other components within normal limits  URINE MICROSCOPIC-ADD ON - Abnormal; Notable for the following:    Squamous Epithelial /  LPF FEW (*)    Bacteria, UA MANY (*)    All other components within normal limits  I-STAT CHEM 8, ED - Abnormal; Notable for the following:    Potassium 3.2 (*)    Glucose, Bld 132 (*)    All other components within normal limits  URINE CULTURE    Imaging Review No results found.   EKG Interpretation None      MDM   Final diagnoses:  UTI (lower urinary tract infection)  Rash    Patient presents with multiple complaints. History of similar presentation in the past. Is concerned that she has insects coming out of her wounds. Exam notable for multiple skin defects but no obvious bites or infestations. Skin defects are similar to abrasions and suspicious for  self-inflicted injury. 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 Basic labwork was obtained. Chem-8 is only notable for mild hypokalemia. Hematocrit is 38. Urinalysis does show evidence of urinary tract infection. Culture sent. Patient given Rocephin. Discussed with patient follow-up with the PCP and referral from there to dermatology if her symptoms do not improve. After further workup, she may need psychiatric evaluation given concerned for delusional parasitosis.  After history, exam, and medical workup I feel the patient has been appropriately medically screened and is safe for discharge home. Pertinent diagnoses were discussed with the patient. Patient was given return precautions.  I personally performed the services described in this documentation, which was scribed in my presence. The recorded information has been reviewed and is accurate.    Gail Hacker, MD 07/24/14 [phone]

## 2014-07-25 LAB — URINE CULTURE: Colony Count: 3000

## 2014-08-02 ENCOUNTER — Ambulatory Visit: Payer: Self-pay | Admitting: Family Medicine

## 2014-08-04 ENCOUNTER — Emergency Department (HOSPITAL_COMMUNITY): Payer: Self-pay

## 2014-08-04 ENCOUNTER — Emergency Department (HOSPITAL_COMMUNITY)
Admission: EM | Admit: 2014-08-04 | Discharge: 2014-08-04 | Disposition: A | Discharge status: Home / Self Care | Payer: Self-pay | Attending: Emergency Medicine | Admitting: Emergency Medicine

## 2014-08-04 ENCOUNTER — Encounter (HOSPITAL_COMMUNITY): Payer: Self-pay | Admitting: *Deleted

## 2014-08-04 DIAGNOSIS — R0602 Shortness of breath: Secondary | ICD-10-CM | POA: Insufficient documentation

## 2014-08-04 DIAGNOSIS — R1013 Epigastric pain: Secondary | ICD-10-CM | POA: Insufficient documentation

## 2014-08-04 DIAGNOSIS — Z8659 Personal history of other mental and behavioral disorders: Secondary | ICD-10-CM | POA: Insufficient documentation

## 2014-08-04 DIAGNOSIS — Z720 Tobacco use: Secondary | ICD-10-CM | POA: Insufficient documentation

## 2014-08-04 DIAGNOSIS — Z792 Long term (current) use of antibiotics: Secondary | ICD-10-CM | POA: Insufficient documentation

## 2014-08-04 DIAGNOSIS — K219 Gastro-esophageal reflux disease without esophagitis: Secondary | ICD-10-CM | POA: Insufficient documentation

## 2014-08-04 DIAGNOSIS — I10 Essential (primary) hypertension: Secondary | ICD-10-CM | POA: Insufficient documentation

## 2014-08-04 LAB — BASIC METABOLIC PANEL
ANION GAP: 11 (ref 5–15)
BUN: 7 mg/dL (ref 6–20)
CALCIUM: 9.1 mg/dL (ref 8.9–10.3)
CO2: 26 mmol/L (ref 22–32)
Chloride: 102 mmol/L (ref 101–111)
Creatinine, Ser: 0.65 mg/dL (ref 0.44–1.00)
GFR calc Af Amer: >60 mL/min (ref >60)
GFR calc non Af Amer: >60 mL/min (ref >60)
GLUCOSE: 91 mg/dL (ref 65–99)
Potassium: 3.6 mmol/L (ref 3.5–5.1)
Sodium: 139 mmol/L (ref 135–145)

## 2014-08-04 LAB — CBC WITH DIFFERENTIAL/PLATELET
BASOS PCT: 1 % (ref 0–1)
Basophils Absolute: 0.1 10*3/uL (ref 0.0–0.1)
EOS PCT: 1 % (ref 0–5)
Eosinophils Absolute: 0.1 10*3/uL (ref 0.0–0.7)
HCT: 33.4 % — ABNORMAL LOW (ref 36.0–46.0)
Hemoglobin: 10.9 g/dL — ABNORMAL LOW (ref 12.0–15.0)
LYMPHS ABS: 2 10*3/uL (ref 0.7–4.0)
Lymphocytes Relative: 23 % (ref 12–46)
MCH: 27.7 pg (ref 26.0–34.0)
MCHC: 32.6 g/dL (ref 30.0–36.0)
MCV: 84.8 fL (ref 78.0–100.0)
MONO ABS: 0.7 10*3/uL (ref 0.1–1.0)
MONOS PCT: 8 % (ref 3–12)
Neutro Abs: 5.7 10*3/uL (ref 1.7–7.7)
Neutrophils Relative %: 67 % (ref 43–77)
PLATELETS: 545 10*3/uL — AB (ref 150–400)
RBC: 3.94 MIL/uL (ref 3.87–5.11)
RDW: 15.9 % — AB (ref 11.5–15.5)
WBC: 8.4 10*3/uL (ref 4.0–10.5)

## 2014-08-04 LAB — TROPONIN I: Troponin I: <0.03 ng/mL (ref <0.031)

## 2014-08-04 LAB — I-STAT TROPONIN, ED: Troponin i, poc: 0 ng/mL (ref 0.00–0.08)

## 2014-08-04 MED ORDER — GI COCKTAIL ~~LOC~~
30.0000 mL | Freq: Once | ORAL | Status: AC
  Administered 2014-08-04: 30 mL via ORAL
  Filled 2014-08-04: qty 30

## 2014-08-04 MED ORDER — SUCRALFATE 1 G PO TABS: 1.0000 g | ORAL_TABLET | Freq: Three times a day (TID) | ORAL | Status: DC

## 2014-08-04 MED ORDER — MORPHINE SULFATE 4 MG/ML IJ SOLN
4.0000 mg | Freq: Once | INTRAMUSCULAR | Status: AC
  Administered 2014-08-04: 4 mg via INTRAVENOUS
  Filled 2014-08-04: qty 1

## 2014-08-04 MED ORDER — OMEPRAZOLE 20 MG PO CPDR: 20.0000 mg | DELAYED_RELEASE_CAPSULE | Freq: Every day | ORAL | Status: DC

## 2014-08-04 MED ORDER — TRAMADOL HCL 50 MG PO TABS: 50.0000 mg | ORAL_TABLET | Freq: Four times a day (QID) | ORAL | Status: DC | PRN

## 2014-08-04 NOTE — ED Provider Notes (Signed)
CSN: [id number]     Arrival date & time    History   First MD Initiated Contact with Patient 08/04/14 1601     Chief Complaint  Patient presents with  . Chest Pain     (Consider location/radiation/quality/duration/timing/severity/associated sxs/prior Treatment) HPI Comments: Patient with PMH of GERD, IBS, HTN, cocaine abuse presents to the ED with a chief complaint of chest pain.  States that the symptoms started yesterday.  They have been intermittent.  She also reports associated intermittent SOB.  She denies any radiating symptoms.  She also reports having a burning sensation in her chest.  There are no aggravating or alleviating factors.  She has tried taking tylenol with no relief.  The history is provided by the patient. No language interpreter was used.    Past Medical History  Diagnosis Date  . History of cocaine abuse 09/28/2010  . Hypertension   . GERD (gastroesophageal reflux disease)   . IBS (irritable bowel syndrome)   . Depression   . Mood swings    Past Surgical History  Procedure Laterality Date  . Appendectomy    . Cholecystectomy    . Tubal ligation    . Tonsillectomy     Family History  Problem Relation Age of Onset  . Hypertension Mother   . Heart disease Father 18    MI  . Stroke Father 72  . Diabetes Father   . GER disease Sister   . Cancer Maternal Grandmother     uterine and stomach   History  Substance Use Topics  . Smoking status: Current Every Day Smoker -- 0.30 packs/day    Types: Cigarettes  . Smokeless tobacco: Not on file  . Alcohol Use: Yes     Comment: occ   OB History    No data available     Review of Systems  Constitutional: Negative for fever and chills.  Respiratory: Positive for shortness of breath.   Cardiovascular: Positive for chest pain.  Gastrointestinal: Negative for nausea, vomiting, diarrhea and constipation.  Genitourinary: Negative for dysuria.  All other systems reviewed and are negative.     Allergies   Hydrocodone  Home Medications   Prior to Admission medications   Medication Sig Start Date End Date Taking? Authorizing Provider  [place] alk-PHENObarbital (DONNATAL) 16.2 MG tablet Take 1 tablet by mouth every 8 (eight) hours as needed (stomach pain).    Historical Provider, MD  calcium carbonate (TUMS - DOSED IN MG ELEMENTAL CALCIUM) 500 MG chewable tablet Chew 2 tablets by mouth 2 (two) times daily as needed for heartburn.    Historical Provider, MD  cephALEXin (KEFLEX) 500 MG capsule Take 1 capsule (500 mg total) by mouth 2 (two) times daily. 07/24/14   Merryl Hacker, MD  hydrochlorothiazide (HYDRODIURIL) 25 MG tablet Take 25 mg by mouth every morning.    Historical Provider, MD  hydrOXYzine (ATARAX/VISTARIL) 25 MG tablet Take 1 tablet (25 mg total) by mouth every 8 (eight) hours as needed. Patient not taking: Reported on 07/21/2014 06/24/14   Tori Milks, MD  oxyCODONE-acetaminophen (PERCOCET) 7.5-325 MG per tablet Take 2 tablets by mouth every 4 (four) hours as needed for moderate pain or severe pain.    Historical Provider, MD  permethrin (ELIMITE) 5 % cream Apply to entire body other than face - let sit for 14 hours then wash off, may repeat in 1 week if still having symptoms 07/21/14   Montine Circle, PA-C  sucralfate (CARAFATE) 1 G tablet Take 1 tablet (  1 g total) by mouth 4 (four) times daily -  with meals and at bedtime. Patient not taking: Reported on 07/21/2014 11/29/13   Daleen Bo, MD   BP 143/97 mmHg  Pulse 71  Temp(Src) 98.5 F (36.9 C) (Oral)  Resp 15  SpO2 97%  LMP 07/14/2014 Physical Exam  Constitutional: She is oriented to person, place, and time. She appears well-developed and well-nourished.  HENT:  Head: Normocephalic and atraumatic.  Eyes: Conjunctivae and EOM are normal. Pupils are equal, round, and reactive to light.  Neck: Normal range of motion. Neck supple.  Cardiovascular: Normal rate and regular rhythm.  Exam reveals no gallop and no friction rub.    No murmur heard. Pulmonary/Chest: Effort normal and breath sounds normal. No respiratory distress. She has no wheezes. She has no rales. She exhibits no tenderness.  Abdominal: Soft. Bowel sounds are normal. She exhibits no distension and no mass. There is no tenderness. There is no rebound and no guarding.  Musculoskeletal: Normal range of motion. She exhibits no edema or tenderness.  Neurological: She is alert and oriented to person, place, and time.  Skin: Skin is warm and dry.  Psychiatric: She has a normal mood and affect. Her behavior is normal. Judgment and thought content normal.  Nursing note and vitals reviewed.   ED Course  Procedures (including critical care time) Results for orders placed or performed during the hospital encounter of 85/63/14  Basic metabolic panel  Result Value Ref Range   Sodium 139 135 - 145 mmol/L   Potassium 3.6 3.5 - 5.1 mmol/L   Chloride 102 101 - 111 mmol/L   CO2 26 22 - 32 mmol/L   Glucose, Bld 91 65 - 99 mg/dL   BUN 7 6 - 20 mg/dL   Creatinine, Ser 0.65 0.44 - 1.00 mg/dL   Calcium 9.1 8.9 - 10.3 mg/dL   GFR calc non Af Amer >60 >60 mL/min   GFR calc Af Amer >60 >60 mL/min   Anion gap 11 5 - 15  Troponin I  Result Value Ref Range   Troponin I <0.03 <0.031 ng/mL  CBC with Differential  Result Value Ref Range   WBC 8.4 4.0 - 10.5 K/uL   RBC 3.94 3.87 - 5.11 MIL/uL   Hemoglobin 10.9 (L) 12.0 - 15.0 g/dL   HCT 33.4 (L) 36.0 - 46.0 %   MCV 84.8 78.0 - 100.0 fL   MCH 27.7 26.0 - 34.0 pg   MCHC 32.6 30.0 - 36.0 g/dL   RDW 15.9 (H) 11.5 - 15.5 %   Platelets 545 (H) 150 - 400 K/uL   Neutrophils Relative % 67 43 - 77 %   Neutro Abs 5.7 1.7 - 7.7 K/uL   Lymphocytes Relative 23 12 - 46 %   Lymphs Abs 2.0 0.7 - 4.0 K/uL   Monocytes Relative 8 3 - 12 %   Monocytes Absolute 0.7 0.1 - 1.0 K/uL   Eosinophils Relative 1 0 - 5 %   Eosinophils Absolute 0.1 0.0 - 0.7 K/uL   Basophils Relative 1 0 - 1 %   Basophils Absolute 0.1 0.0 - 0.1 K/uL   I-stat troponin, ED  Result Value Ref Range   Troponin i, poc 0.00 0.00 - 0.08 ng/mL   Comment 3           Dg Chest 2 View  08/04/2014   CLINICAL DATA:  Left-sided chest pain.  EXAM: CHEST - 2 VIEW  COMPARISON:  Two-view chest x-ray 05/16/2012.  FINDINGS: The heart size and mediastinal contours are within normal limits. Both lungs are clear. The visualized skeletal structures are unremarkable.  IMPRESSION: Negative two view chest x-ray   Electronically Signed   By: San Morelle M.D.   On: 08/04/2014 16:25      EKG Interpretation   Date/Time:  Thursday Aug 04 2014 15:45:17 EDT Ventricular Rate:  70 PR Interval:  129 QRS Duration: 77 QT Interval:  383 QTC Calculation: 413 R Axis:   49 Text Interpretation:  Sinus rhythm Consider left ventricular hypertrophy T  wave changes less prominent from EKG minutes earlier Confirmed by GOLDSTON   MD, SCOTT ([id number]) on 08/04/2014 3:49:08 PM      MDM   Final diagnoses:  Epigastric pain    Patient with intermittent CP and SOB since yesterday.  Will check labs and reassess.  Labs and imaging are reassuring.  Delta troponin is negative. PERC negative. Patient has long history of peptic ulcers, will treat with omeprazole and Carafate. Recommend GI follow-up. Patient understands and agrees with the plan. She is stable and ready for discharge.    Montine Circle, PA-C 08/05/14 0028  Charlesetta Shanks, MD 08/05/14 0130

## 2014-08-04 NOTE — Discharge Instructions (Signed)
Abdominal Pain Many things can cause abdominal pain. Usually, abdominal pain is not caused by a disease and will improve without treatment. It can often be observed and treated at home. Your health care provider will do a physical exam and possibly order blood tests and X-rays to help determine the seriousness of your pain. However, in many cases, more time must pass before a clear cause of the pain can be found. Before that point, your health care provider may not know if you need more testing or further treatment. HOME CARE INSTRUCTIONS  Monitor your abdominal pain for any changes. The following actions may help to alleviate any discomfort you are experiencing:  Only take over-the-counter or prescription medicines as directed by your health care provider.  Do not take laxatives unless directed to do so by your health care provider.  Try a clear liquid diet (broth, tea, or water) as directed by your health care provider. Slowly move to a bland diet as tolerated. SEEK MEDICAL CARE IF:  You have unexplained abdominal pain.  You have abdominal pain associated with nausea or diarrhea.  You have pain when you urinate or have a bowel movement.  You experience abdominal pain that wakes you in the night.  You have abdominal pain that is worsened or improved by eating food.  You have abdominal pain that is worsened with eating fatty foods.  You have a fever. SEEK IMMEDIATE MEDICAL CARE IF:   Your pain does not go away within 2 hours.  You keep throwing up (vomiting).  Your pain is felt only in portions of the abdomen, such as the right side or the left lower portion of the abdomen.  You pass bloody or black tarry stools. MAKE SURE YOU:  Understand these instructions.   Will watch your condition.   Will get help right away if you are not doing well or get worse.  Document Released: 12/05/2004 Document Revised: 03/02/2013 Document Reviewed: 11/04/2012 [facility] Patient Information  2015 [place], [place]. This information is not intended to replace advice given to you by your health care provider. Make sure you discuss any questions you have with your health care provider. Gastroesophageal Reflux Disease, Adult Gastroesophageal reflux disease (GERD) happens when acid from your stomach flows up into the esophagus. When acid comes in contact with the esophagus, the acid causes soreness (inflammation) in the esophagus. Over time, GERD may create small holes (ulcers) in the lining of the esophagus. CAUSES   Increased body weight. This puts pressure on the stomach, making acid rise from the stomach into the esophagus.  Smoking. This increases acid production in the stomach.  Drinking alcohol. This causes decreased pressure in the lower esophageal sphincter (valve or ring of muscle between the esophagus and stomach), allowing acid from the stomach into the esophagus.  Late evening meals and a full stomach. This increases pressure and acid production in the stomach.  A malformed lower esophageal sphincter. Sometimes, no cause is found. SYMPTOMS   Burning pain in the lower part of the mid-chest behind the breastbone and in the mid-stomach area. This may occur twice a week or more often.  Trouble swallowing.  Sore throat.  Dry cough.  Asthma-like symptoms including chest tightness, shortness of breath, or wheezing. DIAGNOSIS  Your caregiver may be able to diagnose GERD based on your symptoms. In some cases, X-rays and other tests may be done to check for complications or to check the condition of your stomach and esophagus. TREATMENT  Your caregiver may recommend  over-the-counter or prescription medicines to help decrease acid production. Ask your caregiver before starting or adding any new medicines.  HOME CARE INSTRUCTIONS   Change the factors that you can control. Ask your caregiver for guidance concerning weight loss, quitting smoking, and alcohol consumption.  Avoid  foods and drinks that make your symptoms worse, such as:  Caffeine or alcoholic drinks.  Chocolate.  Peppermint or mint flavorings.  Garlic and onions.  Spicy foods.  Citrus fruits, such as oranges, lemons, or limes.  Tomato-based foods such as sauce, chili, salsa, and pizza.  Fried and fatty foods.  Avoid lying down for the 3 hours prior to your bedtime or prior to taking a nap.  Eat small, frequent meals instead of large meals.  Wear loose-fitting clothing. Do not wear anything tight around your waist that causes pressure on your stomach.  Raise the head of your bed 6 to 8 inches with wood blocks to help you sleep. Extra pillows will not help.  Only take over-the-counter or prescription medicines for pain, discomfort, or fever as directed by your caregiver.  Do not take aspirin, ibuprofen, or other nonsteroidal anti-inflammatory drugs (NSAIDs). SEEK IMMEDIATE MEDICAL CARE IF:   You have pain in your arms, neck, jaw, teeth, or back.  Your pain increases or changes in intensity or duration.  You develop nausea, vomiting, or sweating (diaphoresis).  You develop shortness of breath, or you faint.  Your vomit is green, yellow, black, or looks like coffee grounds or blood.  Your stool is red, bloody, or black. These symptoms could be signs of other problems, such as heart disease, gastric bleeding, or esophageal bleeding. MAKE SURE YOU:   Understand these instructions.  Will watch your condition.  Will get help right away if you are not doing well or get worse. Document Released: 12/05/2004 Document Revised: 05/20/2011 Document Reviewed: 09/14/2010 [facility] Patient Information 2015 [place], [place]. This information is not intended to replace advice given to you by your health care provider. Make sure you discuss any questions you have with your health care provider.

## 2014-08-04 NOTE — ED Notes (Signed)
Patient transported to X-ray 

## 2014-08-04 NOTE — ED Notes (Signed)
Pt arrives from home via [facility]. Pt states she began having left sided chest pain that feels like its "burning from the inside". Pt also has c/o nausea, sob, diaphoresis and states she has pain with palpation, movement and inspiration. Pt received 4 aspirin and 1 of nitro.. Pt refuses additional nitro rt burning tongue and h/a.

## 2014-08-06 ENCOUNTER — Emergency Department (HOSPITAL_COMMUNITY): Payer: Self-pay

## 2014-08-06 ENCOUNTER — Emergency Department (HOSPITAL_COMMUNITY)
Admission: EM | Admit: 2014-08-06 | Discharge: 2014-08-06 | Disposition: A | Discharge status: Home / Self Care | Payer: Self-pay | Attending: Emergency Medicine | Admitting: Emergency Medicine

## 2014-08-06 DIAGNOSIS — Z792 Long term (current) use of antibiotics: Secondary | ICD-10-CM | POA: Insufficient documentation

## 2014-08-06 DIAGNOSIS — R112 Nausea with vomiting, unspecified: Secondary | ICD-10-CM | POA: Insufficient documentation

## 2014-08-06 DIAGNOSIS — F329 Major depressive disorder, single episode, unspecified: Secondary | ICD-10-CM | POA: Insufficient documentation

## 2014-08-06 DIAGNOSIS — I10 Essential (primary) hypertension: Secondary | ICD-10-CM | POA: Insufficient documentation

## 2014-08-06 DIAGNOSIS — K219 Gastro-esophageal reflux disease without esophagitis: Secondary | ICD-10-CM | POA: Insufficient documentation

## 2014-08-06 DIAGNOSIS — Z79899 Other long term (current) drug therapy: Secondary | ICD-10-CM | POA: Insufficient documentation

## 2014-08-06 DIAGNOSIS — R079 Chest pain, unspecified: Secondary | ICD-10-CM | POA: Insufficient documentation

## 2014-08-06 DIAGNOSIS — Z3202 Encounter for pregnancy test, result negative: Secondary | ICD-10-CM | POA: Insufficient documentation

## 2014-08-06 DIAGNOSIS — R0602 Shortness of breath: Secondary | ICD-10-CM | POA: Insufficient documentation

## 2014-08-06 DIAGNOSIS — R8290 Unspecified abnormal findings in urine: Secondary | ICD-10-CM | POA: Insufficient documentation

## 2014-08-06 DIAGNOSIS — R21 Rash and other nonspecific skin eruption: Secondary | ICD-10-CM | POA: Insufficient documentation

## 2014-08-06 DIAGNOSIS — Z720 Tobacco use: Secondary | ICD-10-CM | POA: Insufficient documentation

## 2014-08-06 LAB — CBC WITH DIFFERENTIAL/PLATELET
BASOS ABS: 0.1 10*3/uL (ref 0.0–0.1)
Basophils Relative: 1 % (ref 0–1)
Eosinophils Absolute: 0.1 10*3/uL (ref 0.0–0.7)
Eosinophils Relative: 1 % (ref 0–5)
HCT: 34.6 % — ABNORMAL LOW (ref 36.0–46.0)
HEMOGLOBIN: 11.5 g/dL — AB (ref 12.0–15.0)
LYMPHS PCT: 22 % (ref 12–46)
Lymphs Abs: 1.9 10*3/uL (ref 0.7–4.0)
MCH: 28.2 pg (ref 26.0–34.0)
MCHC: 33.2 g/dL (ref 30.0–36.0)
MCV: 84.8 fL (ref 78.0–100.0)
Monocytes Absolute: 0.6 10*3/uL (ref 0.1–1.0)
Monocytes Relative: 7 % (ref 3–12)
NEUTROS ABS: 6 10*3/uL (ref 1.7–7.7)
NEUTROS PCT: 69 % (ref 43–77)
Platelets: 524 10*3/uL — ABNORMAL HIGH (ref 150–400)
RBC: 4.08 MIL/uL (ref 3.87–5.11)
RDW: 16 % — AB (ref 11.5–15.5)
WBC: 8.5 10*3/uL (ref 4.0–10.5)

## 2014-08-06 LAB — COMPREHENSIVE METABOLIC PANEL
ALBUMIN: 3.6 g/dL (ref 3.5–5.0)
ALK PHOS: 67 U/L (ref 38–126)
ALT: 12 U/L — ABNORMAL LOW (ref 14–54)
AST: 19 U/L (ref 15–41)
Anion gap: 11 (ref 5–15)
BILIRUBIN TOTAL: 0.4 mg/dL (ref 0.3–1.2)
BUN: 6 mg/dL (ref 6–20)
CHLORIDE: 99 mmol/L — AB (ref 101–111)
CO2: 26 mmol/L (ref 22–32)
Calcium: 9.1 mg/dL (ref 8.9–10.3)
Creatinine, Ser: 0.63 mg/dL (ref 0.44–1.00)
GFR calc Af Amer: >60 mL/min (ref >60)
Glucose, Bld: 91 mg/dL (ref 65–99)
Potassium: 3.1 mmol/L — ABNORMAL LOW (ref 3.5–5.1)
Sodium: 136 mmol/L (ref 135–145)
TOTAL PROTEIN: 7.2 g/dL (ref 6.5–8.1)

## 2014-08-06 LAB — POC URINE PREG, ED: PREG TEST UR: NEGATIVE

## 2014-08-06 LAB — URINALYSIS, ROUTINE W REFLEX MICROSCOPIC
GLUCOSE, UA: NEGATIVE mg/dL
Hgb urine dipstick: NEGATIVE
Ketones, ur: 40 mg/dL — AB
Nitrite: NEGATIVE
PH: 6 (ref 5.0–8.0)
PROTEIN: 30 mg/dL — AB
Specific Gravity, Urine: 1.041 — ABNORMAL HIGH (ref 1.005–1.030)
Urobilinogen, UA: 0.2 mg/dL (ref 0.0–1.0)

## 2014-08-06 LAB — URINE MICROSCOPIC-ADD ON

## 2014-08-06 LAB — RAPID URINE DRUG SCREEN, HOSP PERFORMED
AMPHETAMINES: NOT DETECTED
Barbiturates: POSITIVE — AB
Benzodiazepines: NOT DETECTED
Cocaine: POSITIVE — AB
OPIATES: POSITIVE — AB
Tetrahydrocannabinol: NOT DETECTED

## 2014-08-06 LAB — I-STAT TROPONIN, ED: Troponin i, poc: 0 ng/mL (ref 0.00–0.08)

## 2014-08-06 MED ORDER — GI COCKTAIL ~~LOC~~
30.0000 mL | Freq: Once | ORAL | Status: AC
  Administered 2014-08-06: 30 mL via ORAL
  Filled 2014-08-06: qty 30

## 2014-08-06 MED ORDER — SODIUM CHLORIDE 0.9 % IV BOLUS (SEPSIS)
500.0000 mL | Freq: Once | INTRAVENOUS | Status: AC
  Administered 2014-08-06: 500 mL via INTRAVENOUS

## 2014-08-06 MED ORDER — FENTANYL CITRATE (PF) 100 MCG/2ML IJ SOLN
50.0000 ug | Freq: Once | INTRAMUSCULAR | Status: AC
  Administered 2014-08-06: 50 ug via INTRAVENOUS
  Filled 2014-08-06: qty 2

## 2014-08-06 NOTE — ED Notes (Signed)
Pt denies cocaine use.

## 2014-08-06 NOTE — ED Notes (Signed)
MD at bedside. 

## 2014-08-06 NOTE — ED Notes (Signed)
Family at bedside. 

## 2014-08-06 NOTE — Discharge Instructions (Signed)

## 2014-08-06 NOTE — ED Notes (Signed)
Pt presents with Chest Wall pain. Pt was seen here for same on Thursday. Pt states that the pain was better with Nitro. Pt states that she refused nitro r/t side effect. Pt states that pain woke her up at 0300 this morning from sleep.

## 2014-08-06 NOTE — ED Provider Notes (Signed)
CSN: [id number]     Arrival date & time 08/06/14  1143 History   First MD Initiated Contact with Patient 08/06/14 1147     Chief Complaint  Patient presents with  . Chest Pain     (Consider location/radiation/quality/duration/timing/severity/associated sxs/prior Treatment) Patient is a 39 y.o. female presenting with chest pain. The history is provided by the patient.  Chest Pain Pain location:  L chest Pain quality: aching and sharp   Pain radiates to:  Upper back Pain radiates to the back: yes   Pain severity:  Moderate Onset quality:  Sudden Duration:  8 hours Timing:  Constant Progression:  Unchanged Chronicity:  New Context comment:  Awoke her from sleep at 3am Relieved by:  Nothing Worsened by:  Nothing tried Ineffective treatments:  None tried Associated symptoms: nausea, shortness of breath and vomiting   Associated symptoms: no abdominal pain, no back pain, no cough, no dizziness, no fatigue, no fever and no headache     Past Medical History  Diagnosis Date  . History of cocaine abuse 09/28/2010  . Hypertension   . GERD (gastroesophageal reflux disease)   . IBS (irritable bowel syndrome)   . Depression   . Mood swings    Past Surgical History  Procedure Laterality Date  . Appendectomy    . Cholecystectomy    . Tubal ligation    . Tonsillectomy     Family History  Problem Relation Age of Onset  . Hypertension Mother   . Heart disease Father 19    MI  . Stroke Father 71  . Diabetes Father   . GER disease Sister   . Cancer Maternal Grandmother     uterine and stomach   History  Substance Use Topics  . Smoking status: Current Every Day Smoker -- 0.30 packs/day    Types: Cigarettes  . Smokeless tobacco: Not on file  . Alcohol Use: Yes     Comment: occ   OB History    No data available     Review of Systems  Constitutional: Negative for fever and fatigue.  HENT: Negative for congestion and drooling.   Eyes: Negative for pain.  Respiratory:  Positive for shortness of breath. Negative for cough.   Cardiovascular: Positive for chest pain.  Gastrointestinal: Positive for nausea and vomiting. Negative for abdominal pain and diarrhea.  Genitourinary: Negative for dysuria and hematuria.       Dark urine  Musculoskeletal: Negative for back pain, gait problem and neck pain.  Skin: Positive for rash. Negative for color change.  Neurological: Negative for dizziness and headaches.  Hematological: Negative for adenopathy.  Psychiatric/Behavioral: Negative for behavioral problems.  All other systems reviewed and are negative.     Allergies  Hydrocodone  Home Medications   Prior to Admission medications   Medication Sig Start Date End Date Taking? Authorizing Provider  [place] alk-PHENObarbital (DONNATAL) 16.2 MG tablet Take 1 tablet by mouth every 8 (eight) hours as needed (stomach pain).    Historical Provider, MD  calcium carbonate (TUMS - DOSED IN MG ELEMENTAL CALCIUM) 500 MG chewable tablet Chew 2 tablets by mouth 2 (two) times daily as needed for heartburn.    Historical Provider, MD  cephALEXin (KEFLEX) 500 MG capsule Take 1 capsule (500 mg total) by mouth 2 (two) times daily. Patient taking differently: Take 500 mg by mouth 2 (two) times daily. Patient cannot remember when she started medication 07/24/14   Merryl Hacker, MD  hydrochlorothiazide (HYDRODIURIL) 25 MG tablet Take 25  mg by mouth every morning.    Historical Provider, MD  hydrOXYzine (ATARAX/VISTARIL) 25 MG tablet Take 1 tablet (25 mg total) by mouth every 8 (eight) hours as needed. Patient not taking: Reported on 07/21/2014 06/24/14   Tori Milks, MD  omeprazole (PRILOSEC) 20 MG capsule Take 1 capsule (20 mg total) by mouth daily. 08/04/14   Montine Circle, PA-C  oxyCODONE-acetaminophen (PERCOCET) 7.5-325 MG per tablet Take 2 tablets by mouth every 4 (four) hours as needed for moderate pain or severe pain.    Historical Provider, MD  permethrin (ELIMITE) 5 %  cream Apply to entire body other than face - let sit for 14 hours then wash off, may repeat in 1 week if still having symptoms 07/21/14   Montine Circle, PA-C  sucralfate (CARAFATE) 1 G tablet Take 1 tablet (1 g total) by mouth 4 (four) times daily -  with meals and at bedtime. 08/04/14   Montine Circle, PA-C  traMADol (ULTRAM) 50 MG tablet Take 1 tablet (50 mg total) by mouth every 6 (six) hours as needed. 08/04/14   Montine Circle, PA-C   BP 131/94 mmHg  Pulse 85  Temp(Src) 97.9 F (36.6 C) (Oral)  Resp 16  Ht 5' 1"  (1.549 m)  Wt 153 lb (69.4 kg)  BMI 28.92 kg/m2  SpO2 100%  LMP 07/14/2014 Physical Exam  Constitutional: She is oriented to person, place, and time. She appears well-developed and well-nourished.  HENT:  Head: Normocephalic.  Mouth/Throat: Oropharynx is clear and moist. No oropharyngeal exudate.  Eyes: Conjunctivae and EOM are normal. Pupils are equal, round, and reactive to light.  Neck: Normal range of motion. Neck supple.  Cardiovascular: Normal rate, regular rhythm, normal heart sounds and intact distal pulses.  Exam reveals no gallop and no friction rub.   No murmur heard. Pulmonary/Chest: Effort normal and breath sounds normal. No respiratory distress. She has no wheezes. She exhibits tenderness (ttp of left upper chest).  Abdominal: Soft. Bowel sounds are normal. There is no tenderness. There is no rebound and no guarding.  Musculoskeletal: Normal range of motion. She exhibits no edema or tenderness.  Symmetric lower extremities without focal tenderness.  Normal strength and sensation in the upper extremities.  2+ and equal distal and proximal pulses in the upper extremities.  Neurological: She is alert and oriented to person, place, and time.  Skin: Skin is warm and dry. Rash noted.  Sparse excoriated lesions mostly seen on trunk  Psychiatric: She has a normal mood and affect. Her behavior is normal.  Nursing note and vitals reviewed.   ED Course   Procedures (including critical care time) Labs Review Labs Reviewed  CBC WITH DIFFERENTIAL/PLATELET - Abnormal; Notable for the following:    Hemoglobin 11.5 (*)    HCT 34.6 (*)    RDW 16.0 (*)    Platelets 524 (*)    All other components within normal limits  COMPREHENSIVE METABOLIC PANEL - Abnormal; Notable for the following:    Potassium 3.1 (*)    Chloride 99 (*)    ALT 12 (*)    All other components within normal limits  URINALYSIS, ROUTINE W REFLEX MICROSCOPIC (NOT AT [facility]) - Abnormal; Notable for the following:    Color, Urine AMBER (*)    APPearance CLOUDY (*)    Specific Gravity, Urine 1.041 (*)    Bilirubin Urine SMALL (*)    Ketones, ur 40 (*)    Protein, ur 30 (*)    Leukocytes, UA TRACE (*)  All other components within normal limits  URINE RAPID DRUG SCREEN (HOSP PERFORMED) NOT AT [facility] - Abnormal; Notable for the following:    Opiates POSITIVE (*)    Cocaine POSITIVE (*)    Barbiturates POSITIVE (*)    All other components within normal limits  URINE MICROSCOPIC-ADD ON - Abnormal; Notable for the following:    Bacteria, UA FEW (*)    All other components within normal limits  I-STAT TROPOININ, ED  POC URINE PREG, ED    Imaging Review Dg Chest 2 View  08/04/2014   CLINICAL DATA:  Left-sided chest pain.  EXAM: CHEST - 2 VIEW  COMPARISON:  Two-view chest x-ray 05/16/2012.  FINDINGS: The heart size and mediastinal contours are within normal limits. Both lungs are clear. The visualized skeletal structures are unremarkable.  IMPRESSION: Negative two view chest x-ray   Electronically Signed   By: San Morelle M.D.   On: 08/04/2014 16:25     EKG Interpretation   Date/Time:  Saturday Aug 06 2014 11:56:41 EDT Ventricular Rate:  80 PR Interval:  135 QRS Duration: 78 QT Interval:  361 QTC Calculation: 416 R Axis:   26 Text Interpretation:  Sinus rhythm Probable left ventricular hypertrophy  non-spec t wave changes more prominent today in lateral leads  Otherwise no  significant change Confirmed by Jackie Russman  MD, Chundra Sauerwein ([id number]) on 08/06/2014  12:08:46 PM      MDM   Final diagnoses:  Chest pain    12:20 PM 39 y.o. female w hx of cocaine abuse, HTN, ibs, smoker who presents with chest pain which awoke her from sleep at 3 AM this morning. Of note the patient was seen here 2 days ago for chest pain which is thought to be of GI origin and discharged home. She states that she had done well yesterday. She notes her pain came on suddenly and is sharp in her left chest radiating to her right chest and her back. She states it has been constant since 3 AM. She is afebrile and vital signs are unremarkable here.Wells/perc neg. pain is reproducible on my exam. We'll get screening labs and imaging. GI cocktail and fentanyl for pain.  Nursing notes from 07/21/14 "pt had become very demanding, saying she needs a narcotic to treat her GERD symptoms and pain from her bites."  3:00 PM: UDS pos for cocaine and barbiturates. Will not give pt a Rx for narcotics for home, she just got tramadol 2 days ago here. Low risk for MACE per HEART score.  I have discussed the diagnosis/risks/treatment options with the patient and believe the pt to be eligible for discharge home to follow-up with her pcp for her ongoing atypical cp. We also discussed returning to the ED immediately if new or worsening sx occur. We discussed the sx which are most concerning (e.g., sob, worsening cp, fever) that necessitate immediate return. Medications administered to the patient during their visit and any new prescriptions provided to the patient are listed below.  Medications given during this visit Medications  fentaNYL (SUBLIMAZE) injection 50 mcg (not administered)  sodium chloride 0.9 % bolus 500 mL (500 mLs Intravenous New Bag/Given 08/06/14 1315)  fentaNYL (SUBLIMAZE) injection 50 mcg (50 mcg Intravenous Given 08/06/14 1321)  gi cocktail (Maalox,Lidocaine,Donnatal) (30 mLs Oral Given  08/06/14 1227)    New Prescriptions   No medications on file     Pamella Pert, MD 08/06/14 1502

## 2014-08-09 ENCOUNTER — Encounter (HOSPITAL_COMMUNITY): Payer: Self-pay | Admitting: Emergency Medicine

## 2014-08-09 ENCOUNTER — Emergency Department (HOSPITAL_COMMUNITY)
Admission: EM | Admit: 2014-08-09 | Discharge: 2014-08-09 | Disposition: A | Discharge status: Home / Self Care | Payer: Self-pay | Attending: Emergency Medicine | Admitting: Emergency Medicine

## 2014-08-09 ENCOUNTER — Emergency Department (HOSPITAL_COMMUNITY): Payer: Self-pay

## 2014-08-09 DIAGNOSIS — L989 Disorder of the skin and subcutaneous tissue, unspecified: Secondary | ICD-10-CM | POA: Insufficient documentation

## 2014-08-09 DIAGNOSIS — Z79899 Other long term (current) drug therapy: Secondary | ICD-10-CM | POA: Insufficient documentation

## 2014-08-09 DIAGNOSIS — R634 Abnormal weight loss: Secondary | ICD-10-CM | POA: Insufficient documentation

## 2014-08-09 DIAGNOSIS — R079 Chest pain, unspecified: Secondary | ICD-10-CM | POA: Insufficient documentation

## 2014-08-09 DIAGNOSIS — K219 Gastro-esophageal reflux disease without esophagitis: Secondary | ICD-10-CM | POA: Insufficient documentation

## 2014-08-09 DIAGNOSIS — Z8659 Personal history of other mental and behavioral disorders: Secondary | ICD-10-CM | POA: Insufficient documentation

## 2014-08-09 DIAGNOSIS — Z792 Long term (current) use of antibiotics: Secondary | ICD-10-CM | POA: Insufficient documentation

## 2014-08-09 DIAGNOSIS — R630 Anorexia: Secondary | ICD-10-CM | POA: Insufficient documentation

## 2014-08-09 DIAGNOSIS — R112 Nausea with vomiting, unspecified: Secondary | ICD-10-CM | POA: Insufficient documentation

## 2014-08-09 DIAGNOSIS — I10 Essential (primary) hypertension: Secondary | ICD-10-CM | POA: Insufficient documentation

## 2014-08-09 DIAGNOSIS — R1013 Epigastric pain: Secondary | ICD-10-CM | POA: Insufficient documentation

## 2014-08-09 DIAGNOSIS — Z720 Tobacco use: Secondary | ICD-10-CM | POA: Insufficient documentation

## 2014-08-09 DIAGNOSIS — G479 Sleep disorder, unspecified: Secondary | ICD-10-CM | POA: Insufficient documentation

## 2014-08-09 LAB — BASIC METABOLIC PANEL
ANION GAP: 11 (ref 5–15)
BUN: <5 mg/dL — ABNORMAL LOW (ref 6–20)
CO2: 27 mmol/L (ref 22–32)
CREATININE: 0.7 mg/dL (ref 0.44–1.00)
Calcium: 9.7 mg/dL (ref 8.9–10.3)
Chloride: 97 mmol/L — ABNORMAL LOW (ref 101–111)
GFR calc Af Amer: >60 mL/min (ref >60)
Glucose, Bld: 98 mg/dL (ref 65–99)
Potassium: 2.8 mmol/L — ABNORMAL LOW (ref 3.5–5.1)
SODIUM: 135 mmol/L (ref 135–145)

## 2014-08-09 LAB — CBC WITH DIFFERENTIAL/PLATELET
BASOS ABS: 0 10*3/uL (ref 0.0–0.1)
BASOS PCT: 0 % (ref 0–1)
Eosinophils Absolute: 0.1 10*3/uL (ref 0.0–0.7)
Eosinophils Relative: 1 % (ref 0–5)
HEMATOCRIT: 35.8 % — AB (ref 36.0–46.0)
HEMOGLOBIN: 11.8 g/dL — AB (ref 12.0–15.0)
LYMPHS PCT: 23 % (ref 12–46)
Lymphs Abs: 2.1 10*3/uL (ref 0.7–4.0)
MCH: 27.7 pg (ref 26.0–34.0)
MCHC: 33 g/dL (ref 30.0–36.0)
MCV: 84 fL (ref 78.0–100.0)
Monocytes Absolute: 0.5 10*3/uL (ref 0.1–1.0)
Monocytes Relative: 5 % (ref 3–12)
NEUTROS PCT: 71 % (ref 43–77)
Neutro Abs: 6.8 10*3/uL (ref 1.7–7.7)
Platelets: 509 10*3/uL — ABNORMAL HIGH (ref 150–400)
RBC: 4.26 MIL/uL (ref 3.87–5.11)
RDW: 15.8 % — ABNORMAL HIGH (ref 11.5–15.5)
WBC: 9.5 10*3/uL (ref 4.0–10.5)

## 2014-08-09 LAB — HEPATIC FUNCTION PANEL
ALBUMIN: 3.6 g/dL (ref 3.5–5.0)
ALK PHOS: 64 U/L (ref 38–126)
ALT: 13 U/L — AB (ref 14–54)
AST: 20 U/L (ref 15–41)
BILIRUBIN TOTAL: 0.5 mg/dL (ref 0.3–1.2)
Bilirubin, Direct: <0.1 mg/dL — ABNORMAL LOW (ref 0.1–0.5)
TOTAL PROTEIN: 7.4 g/dL (ref 6.5–8.1)

## 2014-08-09 LAB — LIPASE, BLOOD: Lipase: 15 U/L — ABNORMAL LOW (ref 22–51)

## 2014-08-09 LAB — I-STAT TROPONIN, ED: TROPONIN I, POC: 0 ng/mL (ref 0.00–0.08)

## 2014-08-09 MED ORDER — POTASSIUM CHLORIDE CRYS ER 20 MEQ PO TBCR
40.0000 meq | EXTENDED_RELEASE_TABLET | Freq: Once | ORAL | Status: AC
  Administered 2014-08-09: 40 meq via ORAL
  Filled 2014-08-09: qty 2

## 2014-08-09 MED ORDER — PANTOPRAZOLE SODIUM 40 MG PO TBEC
40.0000 mg | DELAYED_RELEASE_TABLET | Freq: Every day | ORAL | Status: DC
  Administered 2014-08-09: 40 mg via ORAL
  Filled 2014-08-09: qty 1

## 2014-08-09 MED ORDER — IOHEXOL 300 MG/ML  SOLN
100.0000 mL | Freq: Once | INTRAMUSCULAR | Status: AC | PRN
  Administered 2014-08-09: 100 mL via INTRAVENOUS

## 2014-08-09 MED ORDER — HYDROMORPHONE HCL 1 MG/ML IJ SOLN
1.0000 mg | Freq: Once | INTRAMUSCULAR | Status: AC
  Administered 2014-08-09: 1 mg via INTRAVENOUS
  Filled 2014-08-09: qty 1

## 2014-08-09 NOTE — Discharge Instructions (Signed)

## 2014-08-09 NOTE — ED Notes (Signed)
Pt ambulated to and from restroom with this RN.  

## 2014-08-09 NOTE — ED Provider Notes (Signed)
CSN: [id number]     Arrival date & time 08/09/14  0136 History  This chart was scribed for Gail Patches, MD by Randa Evens, ED Scribe. This patient was seen in room A04C/A04C and the patient's care was started at 2:38 AM.      Chief Complaint  Patient presents with  . Chest Pain   Patient is a 39 y.o. female presenting with chest pain. The history is provided by the patient. No language interpreter was used.  Chest Pain Pain radiates to the back: yes   Pain severity:  Moderate Onset quality:  Gradual Duration:  2 weeks Progression:  Worsening Relieved by:  Nothing Worsened by:  Certain positions Ineffective treatments:  None tried Associated symptoms: nausea and vomiting   Associated symptoms: no abdominal pain, no back pain, no cough, no diaphoresis, no fatigue, no fever, no headache, no numbness, no palpitations, no shortness of breath and no weakness    HPI Comments: [name] is a 39 y.o. female who presents to the Emergency Department complaining of worseing CP onset 2 weeks prior. She states that the pain radiates to her ribs and back. Pt describes the pain as pins sticking in her chest. Pt reports unexpected weight loss of 30 pounds, decreased appetite, vomiting, decreased urine, sleep disturbance. Pt states she feels as if there are "beads and balls" in her ribs and back. Pt also states that she feels "everything in my body is shutting down, my kidneys are rolling around and its just not right." Pt states that all her symptoms began about 1 month ago when she got bit by something that she is unsure of. Pt states she had a fever for about 3-4 days that resolved. Pt states that her decreased appetite gradually worsened. Pt states she also had intermittent episodes of syncope where she would hit her head. Pt also reports a fever of 105 1 week prior that has resolved. Pt states that her pain is worse when laying back or rolling side to side. Pt denies cough or leg swelling. Pt  denies any new medications. Pt states she does smoke about 6-7 cigarettes daily. Pt denies any alcohol use. Denies any recent sick contacts.     Past Medical History  Diagnosis Date  . History of cocaine abuse 09/28/2010  . Hypertension   . GERD (gastroesophageal reflux disease)   . IBS (irritable bowel syndrome)   . Depression   . Mood swings    Past Surgical History  Procedure Laterality Date  . Appendectomy    . Cholecystectomy    . Tubal ligation    . Tonsillectomy     Family History  Problem Relation Age of Onset  . Hypertension Mother   . Heart disease Father 25    MI  . Stroke Father 97  . Diabetes Father   . GER disease Sister   . Cancer Maternal Grandmother     uterine and stomach   History  Substance Use Topics  . Smoking status: Current Every Day Smoker -- 0.30 packs/day    Types: Cigarettes  . Smokeless tobacco: Not on file  . Alcohol Use: Yes     Comment: occ   OB History    No data available     Review of Systems  Constitutional: Positive for appetite change and unexpected weight change. Negative for fever, chills, diaphoresis, activity change and fatigue.  HENT: Negative for congestion, facial swelling, rhinorrhea and sore throat.   Eyes: Negative for photophobia and discharge.  Respiratory: Negative for cough, chest tightness and shortness of breath.   Cardiovascular: Positive for chest pain. Negative for palpitations and leg swelling.  Gastrointestinal: Positive for nausea and vomiting. Negative for abdominal pain and diarrhea.  Endocrine: Negative for polydipsia and polyuria.  Genitourinary: Positive for decreased urine volume. Negative for dysuria, frequency, difficulty urinating and pelvic pain.  Musculoskeletal: Negative for back pain, arthralgias, neck pain and neck stiffness.  Skin: Negative for color change and wound.  Allergic/Immunologic: Negative for immunocompromised state.  Neurological: Negative for facial asymmetry, weakness,  numbness and headaches.  Hematological: Does not bruise/bleed easily.  Psychiatric/Behavioral: Positive for sleep disturbance. Negative for confusion and agitation.      Allergies  Hydrocodone  Home Medications   Prior to Admission medications   Medication Sig Start Date End Date Taking? Authorizing Provider  acetaminophen (TYLENOL) 325 MG tablet Take 650 mg by mouth every 6 (six) hours as needed for mild pain.   Yes Historical Provider, MD  alum hydroxide-mag trisilicate (GAVISCON) 26-71 MG CHEW chewable tablet Chew 1 tablet by mouth 3 (three) times daily as needed for indigestion or heartburn.   Yes Historical Provider, MD  [place] alk-PHENObarbital (DONNATAL) 16.2 MG tablet Take 1 tablet by mouth every 8 (eight) hours as needed (stomach pain).   Yes Historical Provider, MD  calcium carbonate (TUMS - DOSED IN MG ELEMENTAL CALCIUM) 500 MG chewable tablet Chew 2 tablets by mouth 2 (two) times daily as needed for heartburn.   Yes Historical Provider, MD  cephALEXin (KEFLEX) 500 MG capsule Take 1 capsule (500 mg total) by mouth 2 (two) times daily. Patient taking differently: Take 500 mg by mouth 2 (two) times daily. Patient cannot remember when she started medication 07/24/14  Yes Merryl Hacker, MD  hydrochlorothiazide (HYDRODIURIL) 25 MG tablet Take 25 mg by mouth every morning.   Yes Historical Provider, MD  hydrOXYzine (ATARAX/VISTARIL) 25 MG tablet Take 1 tablet (25 mg total) by mouth every 8 (eight) hours as needed. 06/24/14  Yes Tori Milks, MD  naproxen sodium (ANAPROX) 220 MG tablet Take 220 mg by mouth 2 (two) times daily as needed (pain).   Yes Historical Provider, MD  omeprazole (PRILOSEC) 20 MG capsule Take 1 capsule (20 mg total) by mouth daily. 08/04/14  Yes Montine Circle, PA-C  oxyCODONE-acetaminophen (PERCOCET) 7.5-325 MG per tablet Take 2 tablets by mouth every 4 (four) hours as needed for moderate pain or severe pain.   Yes Historical Provider, MD  permethrin (ELIMITE)  5 % cream Apply to entire body other than face - let sit for 14 hours then wash off, may repeat in 1 week if still having symptoms 07/21/14  Yes Montine Circle, PA-C  sucralfate (CARAFATE) 1 G tablet Take 1 tablet (1 g total) by mouth 4 (four) times daily -  with meals and at bedtime. 08/04/14  Yes Montine Circle, PA-C  traMADol (ULTRAM) 50 MG tablet Take 1 tablet (50 mg total) by mouth every 6 (six) hours as needed. 08/04/14  Yes Montine Circle, PA-C   BP 196/98 mmHg  Pulse 63  Temp(Src) 98.7 F (37.1 C) (Oral)  Resp 13  Ht 5' 1"  (1.549 m)  Wt 153 lb (69.4 kg)  BMI 28.92 kg/m2  SpO2 98%  LMP 07/14/2014   Physical Exam  Constitutional: She is oriented to person, place, and time. She appears well-developed and well-nourished. No distress.  HENT:  Head: Normocephalic.  Mouth/Throat: Oropharynx is clear and moist.  Eyes: Pupils are equal, round, and reactive to light.  Neck: Neck supple.  Cardiovascular: Normal rate, regular rhythm and normal heart sounds.   Pulmonary/Chest: Effort normal and breath sounds normal. No respiratory distress. She has no wheezes.  Abdominal: Soft. She exhibits no distension. There is tenderness in the epigastric area. There is no rebound and no guarding.  Musculoskeletal: She exhibits no edema or tenderness.  Neurological: She is alert and oriented to person, place, and time.  Skin: Skin is warm and dry.  multiple excoriated lesion of posterior neck, shotty posterior cervical lymph nodes.   Psychiatric: She has a normal mood and affect.  Nursing note and vitals reviewed.   ED Course  Procedures (including critical care time) DIAGNOSTIC STUDIES: Oxygen Saturation is 100% on RA, normal by my interpretation.    COORDINATION OF CARE:    Labs Review Labs Reviewed  CBC WITH DIFFERENTIAL/PLATELET - Abnormal; Notable for the following:    Hemoglobin 11.8 (*)    HCT 35.8 (*)    RDW 15.8 (*)    Platelets 509 (*)    All other components within normal  limits  BASIC METABOLIC PANEL - Abnormal; Notable for the following:    Potassium 2.8 (*)    Chloride 97 (*)    BUN <5 (*)    All other components within normal limits  LIPASE, BLOOD - Abnormal; Notable for the following:    Lipase 15 (*)    All other components within normal limits  HEPATIC FUNCTION PANEL - Abnormal; Notable for the following:    ALT 13 (*)    Bilirubin, Direct <0.1 (*)    All other components within normal limits  I-STAT TROPOININ, ED    Imaging Review Dg Chest 2 View  08/09/2014   CLINICAL DATA:  Acute onset of generalized chest pain and nausea. Initial encounter.  EXAM: CHEST  2 VIEW  COMPARISON:  Chest radiograph performed 08/06/2014  FINDINGS: The lungs are well-aerated and clear. There is no evidence of focal opacification, pleural effusion or pneumothorax.  The heart is normal in size; the mediastinal contour is within normal limits. No acute osseous abnormalities are seen.  IMPRESSION: No acute cardiopulmonary process seen.   Electronically Signed   By: Garald Balding M.D.   On: 08/09/2014 03:27   Ct Angio Chest Pe W/cm &/or Wo Cm  08/09/2014   CLINICAL DATA:  Chest pain, weight loss, tobacco abuse. Diaphoresis and nausea.  EXAM: CT ANGIOGRAPHY CHEST WITH CONTRAST  TECHNIQUE: Multidetector CT imaging of the chest was performed using the standard protocol during bolus administration of intravenous contrast. Multiplanar CT image reconstructions and MIPs were obtained to evaluate the vascular anatomy.  CONTRAST:  185m OMNIPAQUE IOHEXOL 350 MG/ML  SOLN  COMPARISON:  Radiographs earlier this day.  FINDINGS: There are no filling defects within the pulmonary arteries to suggest pulmonary embolus.  Thoracic aorta is normal in course in caliber without dissection or aneurysm. The heart size is normal. There is no pleural or pericardial effusion. No mediastinal or hilar adenopathy. Small left axillary lymph nodes without enlarged adenopathy.  Trachea and mainstem bronchi are  patent. There is no consolidation, pulmonary nodule or mass.  No acute abnormality in the included upper abdomen. Postcholecystectomy with clips in the gallbladder fossa.  There are no acute or suspicious osseous abnormalities. Mild degenerative disc disease in the mid thoracic spine.  Review of the MIP images confirms the above findings.  IMPRESSION: 1. No pulmonary embolus. 2. No acute intrathoracic process.   Electronically Signed   By: MFonnie BirkenheadD.  On: 08/09/2014 04:02     EKG Interpretation   Date/Time:  Tuesday Aug 09 2014 01:54:19 EDT Ventricular Rate:  72 PR Interval:  119 QRS Duration: 77 QT Interval:  549 QTC Calculation: 601 R Axis:   37 Text Interpretation:  Sinus rhythm Borderline short PR interval Consider  left ventricular hypertrophy Prolonged QT interval prolonged QT and short  PR are new.  Confirmed by DOCHERTY  MD, [place] [phone]) on 08/09/2014 1:58:53  AM      MDM   Final diagnoses:  Tobacco abuse  Weight loss  Chest pain  Epigastric pain   Pt is a 39 y.o. female with Pmhx as above who presents with third visit in less than one week for chest pain.  Pain is described as a left-sided severe burning.  Patient is also complaining of weight loss, beads in balls on her chest and epigastric tenderness.  She has had benign cardiac workups earlier this week, has not followed up with her PCP.  On exam, vital signs are stable.  Patient appears in no acute distress.  Initially, though later during exam rolls into a ball, and begins crying.  She is reproducible epigastric tenderness but no chest wall tenderness.  Lungs are clear.  I do not feel these "beads and balls"which she states all over her body.  She has no evidence of abnormal lymphadenopathy.  Given recurrent visits with continued chest pain, CT of chest ordered, which was normal.  Troponin was again negative.  CMP including LFTs and lipase remain normal.  The pain is much more likely to be GI in nature or related  to her history of cocaine abuse.  She has a prescription for Prilosec, which I have encouraged her to continue.  I've explained to her that at this point, she has much better served to follow-up with her PCP and for continued ED visits for the same complaint.      [place] evaluation in the Emergency Department is complete. It has been determined that no acute conditions requiring further emergency intervention are present at this time. The patient/guardian have been advised of the diagnosis and plan. We have discussed signs and symptoms that warrant return to the ED, such as changes or worsening in symptoms, worsening pain, fever.      I personally performed the services described in this documentation, which was scribed in my presence. The recorded information has been reviewed and is accurate.       Gail Patches, MD 08/09/14 [phone]

## 2014-08-09 NOTE — ED Notes (Addendum)
Per EMS patient called to home due to worsening chest pain. Patient states she has been seen before but nothing has been found. States Diaphoresis and nausea today.  EMS gave 4mg  zofran and 1 nitro.

## 2014-11-27 ENCOUNTER — Encounter (HOSPITAL_COMMUNITY): Payer: Self-pay

## 2014-11-27 ENCOUNTER — Emergency Department (HOSPITAL_COMMUNITY): Payer: Self-pay

## 2014-11-27 ENCOUNTER — Emergency Department (HOSPITAL_COMMUNITY)
Admission: EM | Admit: 2014-11-27 | Discharge: 2014-11-27 | Disposition: A | Discharge status: Home / Self Care | Payer: Self-pay | Attending: Emergency Medicine | Admitting: Emergency Medicine

## 2014-11-27 DIAGNOSIS — N9489 Other specified conditions associated with female genital organs and menstrual cycle: Secondary | ICD-10-CM

## 2014-11-27 DIAGNOSIS — R1013 Epigastric pain: Secondary | ICD-10-CM

## 2014-11-27 DIAGNOSIS — K439 Ventral hernia without obstruction or gangrene: Secondary | ICD-10-CM

## 2014-11-27 DIAGNOSIS — R102 Pelvic and perineal pain: Secondary | ICD-10-CM

## 2014-11-27 DIAGNOSIS — Z79899 Other long term (current) drug therapy: Secondary | ICD-10-CM | POA: Insufficient documentation

## 2014-11-27 DIAGNOSIS — N390 Urinary tract infection, site not specified: Secondary | ICD-10-CM

## 2014-11-27 DIAGNOSIS — R079 Chest pain, unspecified: Secondary | ICD-10-CM | POA: Insufficient documentation

## 2014-11-27 DIAGNOSIS — I10 Essential (primary) hypertension: Secondary | ICD-10-CM | POA: Insufficient documentation

## 2014-11-27 DIAGNOSIS — Z3202 Encounter for pregnancy test, result negative: Secondary | ICD-10-CM | POA: Insufficient documentation

## 2014-11-27 DIAGNOSIS — Z720 Tobacco use: Secondary | ICD-10-CM | POA: Insufficient documentation

## 2014-11-27 LAB — COMPREHENSIVE METABOLIC PANEL
ALBUMIN: 3.7 g/dL (ref 3.5–5.0)
ALK PHOS: 62 U/L (ref 38–126)
ALT: 9 U/L — ABNORMAL LOW (ref 14–54)
ANION GAP: 11 (ref 5–15)
AST: 17 U/L (ref 15–41)
BUN: <5 mg/dL — ABNORMAL LOW (ref 6–20)
CALCIUM: 8.7 mg/dL — AB (ref 8.9–10.3)
CO2: 25 mmol/L (ref 22–32)
Chloride: 98 mmol/L — ABNORMAL LOW (ref 101–111)
Creatinine, Ser: 0.62 mg/dL (ref 0.44–1.00)
GFR calc non Af Amer: >60 mL/min (ref >60)
Glucose, Bld: 91 mg/dL (ref 65–99)
POTASSIUM: 3 mmol/L — AB (ref 3.5–5.1)
SODIUM: 134 mmol/L — AB (ref 135–145)
Total Bilirubin: 0.3 mg/dL (ref 0.3–1.2)
Total Protein: 7.3 g/dL (ref 6.5–8.1)

## 2014-11-27 LAB — URINALYSIS, ROUTINE W REFLEX MICROSCOPIC
BILIRUBIN URINE: NEGATIVE
Glucose, UA: NEGATIVE mg/dL
KETONES UR: NEGATIVE mg/dL
NITRITE: NEGATIVE
Protein, ur: NEGATIVE mg/dL
Specific Gravity, Urine: 1.022 (ref 1.005–1.030)
UROBILINOGEN UA: 0.2 mg/dL (ref 0.0–1.0)
pH: 7 (ref 5.0–8.0)

## 2014-11-27 LAB — CBC WITH DIFFERENTIAL/PLATELET
BASOS PCT: 0 %
Basophils Absolute: 0 10*3/uL (ref 0.0–0.1)
EOS ABS: 0.1 10*3/uL (ref 0.0–0.7)
Eosinophils Relative: 1 %
HEMATOCRIT: 30.9 % — AB (ref 36.0–46.0)
HEMOGLOBIN: 9.8 g/dL — AB (ref 12.0–15.0)
LYMPHS ABS: 1.8 10*3/uL (ref 0.7–4.0)
Lymphocytes Relative: 17 %
MCH: 25.5 pg — AB (ref 26.0–34.0)
MCHC: 31.7 g/dL (ref 30.0–36.0)
MCV: 80.3 fL (ref 78.0–100.0)
Monocytes Absolute: 0.5 10*3/uL (ref 0.1–1.0)
Monocytes Relative: 5 %
NEUTROS ABS: 8.1 10*3/uL — AB (ref 1.7–7.7)
NEUTROS PCT: 77 %
Platelets: 579 10*3/uL — ABNORMAL HIGH (ref 150–400)
RBC: 3.85 MIL/uL — AB (ref 3.87–5.11)
RDW: 16.9 % — ABNORMAL HIGH (ref 11.5–15.5)
WBC: 10.5 10*3/uL (ref 4.0–10.5)

## 2014-11-27 LAB — LIPASE, BLOOD: Lipase: 22 U/L (ref 22–51)

## 2014-11-27 LAB — URINE MICROSCOPIC-ADD ON

## 2014-11-27 LAB — I-STAT BETA HCG BLOOD, ED (MC, WL, AP ONLY)

## 2014-11-27 LAB — I-STAT TROPONIN, ED
TROPONIN I, POC: 0 ng/mL (ref 0.00–0.08)
TROPONIN I, POC: 0 ng/mL (ref 0.00–0.08)

## 2014-11-27 MED ORDER — PANTOPRAZOLE SODIUM 40 MG IV SOLR
40.0000 mg | Freq: Once | INTRAVENOUS | Status: AC
  Administered 2014-11-27: 40 mg via INTRAVENOUS
  Filled 2014-11-27: qty 40

## 2014-11-27 MED ORDER — ONDANSETRON 4 MG PO TBDP: ORAL_TABLET | ORAL | Status: DC

## 2014-11-27 MED ORDER — MORPHINE SULFATE (PF) 4 MG/ML IV SOLN
6.0000 mg | Freq: Once | INTRAVENOUS | Status: AC
  Administered 2014-11-27: 6 mg via INTRAVENOUS
  Filled 2014-11-27: qty 2

## 2014-11-27 MED ORDER — MORPHINE SULFATE (PF) 4 MG/ML IV SOLN: 4.0000 mg | Freq: Once | INTRAVENOUS | Status: DC

## 2014-11-27 MED ORDER — SODIUM CHLORIDE 0.9 % IV BOLUS (SEPSIS)
1000.0000 mL | Freq: Once | INTRAVENOUS | Status: AC
  Administered 2014-11-27: 1000 mL via INTRAVENOUS

## 2014-11-27 MED ORDER — CEPHALEXIN 500 MG PO CAPS: 500.0000 mg | ORAL_CAPSULE | Freq: Two times a day (BID) | ORAL | Status: DC

## 2014-11-27 MED ORDER — ONDANSETRON HCL 4 MG/2ML IJ SOLN
4.0000 mg | Freq: Once | INTRAMUSCULAR | Status: AC
  Administered 2014-11-27: 4 mg via INTRAVENOUS
  Filled 2014-11-27: qty 2

## 2014-11-27 MED ORDER — IOHEXOL 300 MG/ML  SOLN
100.0000 mL | Freq: Once | INTRAMUSCULAR | Status: AC | PRN
  Administered 2014-11-27: 100 mL via INTRAVENOUS

## 2014-11-27 MED ORDER — HYDROMORPHONE HCL 1 MG/ML IJ SOLN
1.0000 mg | Freq: Once | INTRAMUSCULAR | Status: AC
  Administered 2014-11-27: 1 mg via INTRAVENOUS
  Filled 2014-11-27: qty 1

## 2014-11-27 NOTE — Discharge Instructions (Signed)
If you were given medicines take as directed.  If you are on coumadin or contraceptives realize their levels and effectiveness is altered by many different medicines.  If you have any reaction (rash, tongues swelling, other) to the medicines stop taking and see a physician.    If your blood pressure was elevated in the ER make sure you follow up for management with a primary doctor or return for chest pain, shortness of breath or stroke symptoms.  Please follow up as directed and return to the ER or see a physician for new or worsening symptoms.  Thank you. Filed Vitals:   11/27/14 1615 11/27/14 1800 11/27/14 1830 11/27/14 1900  BP: 169/88 178/108 173/89 147/89  Pulse: 62 57 57 55  Temp:      TempSrc:      Resp: 14 18 15 11   SpO2: 100% 100% 99% 100%

## 2014-11-27 NOTE — ED Provider Notes (Signed)
CSN: [id number]     Arrival date & time 11/27/14  1430 History   First MD Initiated Contact with Patient 11/27/14 1500     Chief Complaint  Patient presents with  . Chest Pain     (Consider location/radiation/quality/duration/timing/severity/associated sxs/prior Treatment) HPI Comments: 39 year old female with history of reflux, ulcer, smoking presents with worsening epigastric and lower chest discomfort for the past 3-4 days. No focal radiation over patient feels short of pain is epigastric and also has pain bilateral flanks. No injuries recall. Patient had her gallbladder removed in the past. Patient had a fever recently 103. No history of similar and gradually worsening. Patient tried narcotics and over-the-counter meds without improvement. Mild streak blood in vomit.  Patient is a 39 y.o. female presenting with chest pain. The history is provided by the patient.  Chest Pain Associated symptoms: abdominal pain, nausea and vomiting   Associated symptoms: no back pain, no cough, no fever and no headache     Past Medical History  Diagnosis Date  . Hypertension    No past surgical history on file. No family history on file. Social History  Substance Use Topics  . Smoking status: Current Every Day Smoker  . Smokeless tobacco: Not on file  . Alcohol Use: No   OB History    No data available     Review of Systems  Constitutional: Negative for fever and chills.  HENT: Negative for congestion.   Eyes: Negative for visual disturbance.  Respiratory: Negative for cough.   Cardiovascular: Positive for chest pain. Negative for leg swelling.  Gastrointestinal: Positive for nausea, vomiting and abdominal pain.  Genitourinary: Negative for dysuria and flank pain.  Musculoskeletal: Negative for back pain, neck pain and neck stiffness.  Skin: Negative for rash.  Neurological: Negative for light-headedness and headaches.      Allergies  Hydrocodone  Home Medications   Prior to  Admission medications   Medication Sig Start Date End Date Taking? Authorizing Provider  hydrochlorothiazide (HYDRODIURIL) 25 MG tablet Take 25 mg by mouth daily.   Yes Historical Provider, MD  omeprazole (PRILOSEC) 40 MG capsule Take 40 mg by mouth daily.   Yes Historical Provider, MD   BP 147/89 mmHg  Pulse 55  Temp(Src) 98.4 F (36.9 C) (Oral)  Resp 11  SpO2 100%  LMP 11/08/2014 Physical Exam  Constitutional: She is oriented to person, place, and time. She appears well-developed and well-nourished.  HENT:  Head: Normocephalic and atraumatic.  Eyes: Right eye exhibits no discharge. Left eye exhibits no discharge.  Neck: Normal range of motion. Neck supple. No tracheal deviation present.  Cardiovascular: Normal rate and regular rhythm.   Pulmonary/Chest: Effort normal and breath sounds normal.  Abdominal: Soft. She exhibits no distension. There is tenderness (mild to moderate epigastric.). There is no guarding.  Musculoskeletal: She exhibits no edema.  Neurological: She is alert and oriented to person, place, and time.  Skin: Skin is warm. No rash noted.  Psychiatric: She has a normal mood and affect.  Nursing note and vitals reviewed.   ED Course  Procedures (including critical care time) Labs Review Labs Reviewed  COMPREHENSIVE METABOLIC PANEL - Abnormal; Notable for the following:    Sodium 134 (*)    Potassium 3.0 (*)    Chloride 98 (*)    BUN <5 (*)    Calcium 8.7 (*)    ALT 9 (*)    All other components within normal limits  CBC WITH DIFFERENTIAL/PLATELET - Abnormal; Notable for the  following:    RBC 3.85 (*)    Hemoglobin 9.8 (*)    HCT 30.9 (*)    MCH 25.5 (*)    RDW 16.9 (*)    Platelets 579 (*)    Neutro Abs 8.1 (*)    All other components within normal limits  URINALYSIS, ROUTINE W REFLEX MICROSCOPIC (NOT AT [facility]) - Abnormal; Notable for the following:    APPearance CLOUDY (*)    Hgb urine dipstick MODERATE (*)    Leukocytes, UA LARGE (*)    All other  components within normal limits  URINE MICROSCOPIC-ADD ON - Abnormal; Notable for the following:    Squamous Epithelial / LPF FEW (*)    Bacteria, UA MANY (*)    All other components within normal limits  LIPASE, BLOOD  I-STAT TROPOININ, ED  I-STAT BETA HCG BLOOD, ED ([place], [facility], [facility] ONLY)  I-STAT TROPOININ, ED    Imaging Review Dg Chest 2 View  11/27/2014   CLINICAL DATA:  Mid sternal and upper back and chest pain with radiation around the top of the chest, sob, difficulty breathing X'a 3 DAYS "Blacked out" three days ago.  EXAM: CHEST  2 VIEW  COMPARISON:  None.  FINDINGS: Cardiac silhouette normal in size and configuration. Normal mediastinal and hilar contours.  Clear lungs.  No pleural effusion or pneumothorax.  Bony thorax is intact.  IMPRESSION: No active cardiopulmonary disease.   Electronically Signed   By: Lajean Manes M.D.   On: 11/27/2014 16:43   US Transvaginal Non-ob  11/27/2014   CLINICAL DATA:  Epigastric pain with fluid in the endometrium and vagina on CT. Negative pregnancy test. Initial encounter.  EXAM: TRANSABDOMINAL AND TRANSVAGINAL ULTRASOUND OF PELVIS  TECHNIQUE: Both transabdominal and transvaginal ultrasound examinations of the pelvis were performed. Transabdominal technique was performed for global imaging of the pelvis including uterus, ovaries, adnexal regions, and pelvic cul-de-sac. It was necessary to proceed with endovaginal exam following the transabdominal exam to visualize the endometrium and ovaries to better advantage.  COMPARISON:  Abdominal pelvic CT same date  FINDINGS: Uterus  Measurements: 8.9 x 5.0 x 6.8 cm. 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 No focal myometrial abnormality identified. The cervix and vagina were not evaluated.  Endometrium  Thickness: 15 mm.  No focal abnormality visualized.  Right ovary  Measurements: 2.7 x 1.9 x 2.6 cm. Normal appearance/no adnexal mass.  Left ovary   Measurements: 3.5 x 2.4 x 3.5 cm. Normal appearance/no adnexal mass.  Other findings  Trace free pelvic fluid, within physiologic limits.  IMPRESSION: 1. Mild uterine heterogeneity and hypervascularity, with engorged ovarian vein collaterals on CT suspicious for pelvic congestion syndrome. 2. Mild nonspecific thickening of the endometrium, within physiologic limits. 3. The questioned fluid in the vagina on CT appears to be anteriorly positioned around the urethra on the sagittal images, suggesting a urethral diverticulum. This is not evaluated by this examination. 4. No suspicious adnexal findings   Electronically Signed   By: Richardean Sale M.D.   On: 11/27/2014 19:52   US Pelvis Complete  11/27/2014   CLINICAL DATA:  Epigastric pain with fluid in the endometrium and vagina on CT. Negative pregnancy test. Initial encounter.  EXAM: TRANSABDOMINAL AND TRANSVAGINAL ULTRASOUND OF PELVIS  TECHNIQUE: Both transabdominal and transvaginal ultrasound examinations of the pelvis were performed. Transabdominal technique was performed for global imaging of the pelvis including uterus, ovaries, adnexal regions, and pelvic cul-de-sac. It was necessary to proceed with  endovaginal exam following the transabdominal exam to visualize the endometrium and ovaries to better advantage.  COMPARISON:  Abdominal pelvic CT same date  FINDINGS: Uterus  Measurements: 8.9 x 5.0 x 6.8 cm. 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 No focal myometrial abnormality identified. The cervix and vagina were not evaluated.  Endometrium  Thickness: 15 mm.  No focal abnormality visualized.  Right ovary  Measurements: 2.7 x 1.9 x 2.6 cm. Normal appearance/no adnexal mass.  Left ovary  Measurements: 3.5 x 2.4 x 3.5 cm. Normal appearance/no adnexal mass.  Other findings  Trace free pelvic fluid, within physiologic limits.  IMPRESSION: 1. Mild uterine heterogeneity and hypervascularity, with  engorged ovarian vein collaterals on CT suspicious for pelvic congestion syndrome. 2. Mild nonspecific thickening of the endometrium, within physiologic limits. 3. The questioned fluid in the vagina on CT appears to be anteriorly positioned around the urethra on the sagittal images, suggesting a urethral diverticulum. This is not evaluated by this examination. 4. No suspicious adnexal findings   Electronically Signed   By: Richardean Sale M.D.   On: 11/27/2014 19:52   Ct Abdomen Pelvis W Contrast  11/27/2014   CLINICAL DATA:  Severe epigastric pain for 2 days.  EXAM: CT ABDOMEN AND PELVIS WITH CONTRAST  TECHNIQUE: Multidetector CT imaging of the abdomen and pelvis was performed using the standard protocol following bolus administration of intravenous contrast.  CONTRAST:  17mL OMNIPAQUE IOHEXOL 300 MG/ML  SOLN  COMPARISON:  None.  FINDINGS: Lower chest:  Unremarkable.  Hepatobiliary: No masses or other significant abnormality identified. There has been a prior cholecystectomy.  Pancreas: No evidence of mass, inflammatory changes, or other significant abnormality.  Spleen:  Within normal limits in size and appearance.  Adrenal Glands:  No masses identified.  Kidneys/Urinary Tract: No evidence of urolithiasis or hydronephrosis. No solid or complex cystic renal masses identified. No masses or calculi seen involving the lower urinary tract.  Stomach/Bowel/Peritoneum: No evidence of wall thickening, mass, or obstruction.  Vascular/Lymphatic: No pathologically enlarged lymph nodes identified. No other significant abnormality noted.  Reproductive: The endometrial canal is fluid-filled. Higher density fluid is also seen within the vagina. No evidence of adnexal masses. Left ovary is somewhat unusually positioned high within the pelvis.  Other: There is a 1.7 by 2.4 by 2.3 cm narrow neck fat containing supraumbilical mid anterior abdominal wall hernia with evidence of fat stranding within the enclosed fat.   Musculoskeletal:  No suspicious bone lesions identified.  IMPRESSION: Normal appearance of solid abdominal organs.  Normal appearance of the bowel.  2.4 cm fat containing narrow neck supraumbilical anterior abdominal wall hernia, with possible strangulation of the entrapped fat.  Fluid within the endometrial canal and vagina. Please correlate clinically.  Somewhat unusual location of the left ovary high within the pelvis.  This may be an anatomic variant, however pelvic ultrasound may be considered if there is any clinical concern of gynecological abnormality.   Electronically Signed   By: Fidela Salisbury M.D.   On: 11/27/2014 17:46   I have personally reviewed and evaluated these images and lab results as part of my medical decision-making.   EKG Interpretation   Date/Time:  Sunday November 27 2014 14:35:03 EDT Ventricular Rate:  65 PR Interval:  137 QRS Duration: 78 QT Interval:  401 QTC Calculation: 417 R Axis:   52 Text Interpretation:  Sinus rhythm Poor baseline, will repeat Confirmed by  ZAVITZ  MD, JOSHUA ([id number]) on 11/27/2014 3:36:01  PM      MDM   Final diagnoses:  Pelvic congestion  Ventral hernia without obstruction or gangrene  Epigastric pain  UTI Patient presents with lower chest pain epigastric pain clinical concern for ulcer versus reflux versus pancreatitis. Plan for cardiac screen, IV fluids, pain meds, antiemetics. With significant pain in ulcer history plan for CT scan to look for signs of perforation.  Patient's pain improved significantly and reassessment. Nonspecific abnormality and CT scan for pelvis. Ultrasound pelvis ordered no acute findings pelvic congestion syndrome. Patient stable for outpatient follow-up.  Results and differential diagnosis were discussed with the patient/parent/guardian. Xrays were independently reviewed by myself.  Close follow up outpatient was discussed, comfortable with the plan.   Medications  ondansetron (ZOFRAN) injection 4  mg (4 mg Intravenous Given 11/27/14 1554)  morphine 4 MG/ML injection 6 mg (6 mg Intravenous Given 11/27/14 1554)  pantoprazole (PROTONIX) injection 40 mg (40 mg Intravenous Given 11/27/14 1617)  sodium chloride 0.9 % bolus 1,000 mL (1,000 mLs Intravenous New Bag/Given 11/27/14 1617)  HYDROmorphone (DILAUDID) injection 1 mg (1 mg Intravenous Given 11/27/14 1737)  iohexol (OMNIPAQUE) 300 MG/ML solution 100 mL (100 mLs Intravenous Contrast Given 11/27/14 1706)    Filed Vitals:   11/27/14 1615 11/27/14 1800 11/27/14 1830 11/27/14 1900  BP: 169/88 178/108 173/89 147/89  Pulse: 62 57 57 55  Temp:      TempSrc:      Resp: 14 18 15 11   SpO2: 100% 100% 99% 100%    Final diagnoses:  Pelvic congestion  Ventral hernia without obstruction or gangrene  Epigastric pain       Elnora Morrison, MD 11/27/14 2001

## 2014-11-27 NOTE — ED Notes (Signed)
Bib ems c/o left sided chest tightness into back, states "knot" to left rib region, naval, right rib and cental chest x2 days. Pt with frequent yawning, restless in bed. Alert x4

## 2014-11-27 NOTE — ED Notes (Signed)
Pt in ultrasound

## 2014-11-28 ENCOUNTER — Encounter (HOSPITAL_COMMUNITY): Payer: Self-pay | Admitting: Emergency Medicine

## 2014-12-21 ENCOUNTER — Emergency Department (HOSPITAL_COMMUNITY)
Admission: EM | Admit: 2014-12-21 | Discharge: 2014-12-22 | Disposition: A | Discharge status: Home / Self Care | Payer: BC Managed Care – PPO | Attending: Emergency Medicine | Admitting: Emergency Medicine

## 2014-12-21 ENCOUNTER — Emergency Department (HOSPITAL_COMMUNITY): Payer: BC Managed Care – PPO

## 2014-12-21 ENCOUNTER — Encounter (HOSPITAL_COMMUNITY): Payer: Self-pay | Admitting: Emergency Medicine

## 2014-12-21 DIAGNOSIS — G8929 Other chronic pain: Secondary | ICD-10-CM | POA: Insufficient documentation

## 2014-12-21 DIAGNOSIS — Z79899 Other long term (current) drug therapy: Secondary | ICD-10-CM | POA: Insufficient documentation

## 2014-12-21 DIAGNOSIS — I10 Essential (primary) hypertension: Secondary | ICD-10-CM | POA: Insufficient documentation

## 2014-12-21 DIAGNOSIS — K219 Gastro-esophageal reflux disease without esophagitis: Secondary | ICD-10-CM | POA: Insufficient documentation

## 2014-12-21 DIAGNOSIS — R1013 Epigastric pain: Secondary | ICD-10-CM | POA: Insufficient documentation

## 2014-12-21 DIAGNOSIS — Z3202 Encounter for pregnancy test, result negative: Secondary | ICD-10-CM | POA: Insufficient documentation

## 2014-12-21 DIAGNOSIS — Z9851 Tubal ligation status: Secondary | ICD-10-CM | POA: Insufficient documentation

## 2014-12-21 DIAGNOSIS — Z720 Tobacco use: Secondary | ICD-10-CM | POA: Insufficient documentation

## 2014-12-21 DIAGNOSIS — R112 Nausea with vomiting, unspecified: Secondary | ICD-10-CM

## 2014-12-21 DIAGNOSIS — Z792 Long term (current) use of antibiotics: Secondary | ICD-10-CM | POA: Insufficient documentation

## 2014-12-21 DIAGNOSIS — Z9049 Acquired absence of other specified parts of digestive tract: Secondary | ICD-10-CM | POA: Insufficient documentation

## 2014-12-21 DIAGNOSIS — R109 Unspecified abdominal pain: Secondary | ICD-10-CM

## 2014-12-21 DIAGNOSIS — F329 Major depressive disorder, single episode, unspecified: Secondary | ICD-10-CM | POA: Insufficient documentation

## 2014-12-21 HISTORY — DX: Unspecified abdominal pain: R10.9

## 2014-12-21 HISTORY — DX: Other chronic pain: G89.29

## 2014-12-21 LAB — COMPREHENSIVE METABOLIC PANEL
ALT: 10 U/L — AB (ref 14–54)
AST: 20 U/L (ref 15–41)
Albumin: 4.1 g/dL (ref 3.5–5.0)
Alkaline Phosphatase: 55 U/L (ref 38–126)
Anion gap: 6 (ref 5–15)
BUN: 7 mg/dL (ref 6–20)
CHLORIDE: 106 mmol/L (ref 101–111)
CO2: 26 mmol/L (ref 22–32)
Calcium: 9.1 mg/dL (ref 8.9–10.3)
Creatinine, Ser: 0.56 mg/dL (ref 0.44–1.00)
GFR calc Af Amer: >60 mL/min (ref >60)
GLUCOSE: 95 mg/dL (ref 65–99)
POTASSIUM: 4 mmol/L (ref 3.5–5.1)
Sodium: 138 mmol/L (ref 135–145)
Total Bilirubin: 0.2 mg/dL — ABNORMAL LOW (ref 0.3–1.2)
Total Protein: 7.7 g/dL (ref 6.5–8.1)

## 2014-12-21 LAB — CBC WITH DIFFERENTIAL/PLATELET
BASOS ABS: 0 10*3/uL (ref 0.0–0.1)
Basophils Relative: 0 %
Eosinophils Absolute: 0.1 10*3/uL (ref 0.0–0.7)
Eosinophils Relative: 1 %
HEMATOCRIT: 31.6 % — AB (ref 36.0–46.0)
Hemoglobin: 10 g/dL — ABNORMAL LOW (ref 12.0–15.0)
LYMPHS PCT: 27 %
Lymphs Abs: 2.3 10*3/uL (ref 0.7–4.0)
MCH: 25.6 pg — ABNORMAL LOW (ref 26.0–34.0)
MCHC: 31.6 g/dL (ref 30.0–36.0)
MCV: 81 fL (ref 78.0–100.0)
MONO ABS: 0.6 10*3/uL (ref 0.1–1.0)
MONOS PCT: 7 %
Neutro Abs: 5.6 10*3/uL (ref 1.7–7.7)
Neutrophils Relative %: 65 %
Platelets: 567 10*3/uL — ABNORMAL HIGH (ref 150–400)
RBC: 3.9 MIL/uL (ref 3.87–5.11)
RDW: 18 % — AB (ref 11.5–15.5)
WBC: 8.6 10*3/uL (ref 4.0–10.5)

## 2014-12-21 LAB — URINALYSIS, ROUTINE W REFLEX MICROSCOPIC
Bilirubin Urine: NEGATIVE
Glucose, UA: NEGATIVE mg/dL
HGB URINE DIPSTICK: NEGATIVE
Ketones, ur: NEGATIVE mg/dL
Nitrite: NEGATIVE
Protein, ur: NEGATIVE mg/dL
SPECIFIC GRAVITY, URINE: 1.014 (ref 1.005–1.030)
Urobilinogen, UA: 0.2 mg/dL (ref 0.0–1.0)
pH: 8 (ref 5.0–8.0)

## 2014-12-21 LAB — LIPASE, BLOOD: LIPASE: 24 U/L (ref 22–51)

## 2014-12-21 LAB — URINE MICROSCOPIC-ADD ON

## 2014-12-21 LAB — TROPONIN I

## 2014-12-21 LAB — PREGNANCY, URINE: Preg Test, Ur: NEGATIVE

## 2014-12-21 MED ORDER — FENTANYL CITRATE (PF) 100 MCG/2ML IJ SOLN
50.0000 ug | INTRAMUSCULAR | Status: AC | PRN
  Administered 2014-12-21 (×2): 50 ug via INTRAVENOUS
  Filled 2014-12-21 (×2): qty 2

## 2014-12-21 MED ORDER — PROMETHAZINE HCL 25 MG RE SUPP
25.0000 mg | Freq: Four times a day (QID) | RECTAL | Status: DC | PRN
Start: 2014-12-21 — End: 2015-06-07

## 2014-12-21 MED ORDER — FAMOTIDINE IN NACL 20-0.9 MG/50ML-% IV SOLN
20.0000 mg | Freq: Once | INTRAVENOUS | Status: AC
  Administered 2014-12-21: 20 mg via INTRAVENOUS
  Filled 2014-12-21: qty 50

## 2014-12-21 MED ORDER — SODIUM CHLORIDE 0.9 % IV BOLUS (SEPSIS)
500.0000 mL | Freq: Once | INTRAVENOUS | Status: AC
  Administered 2014-12-21: 500 mL via INTRAVENOUS

## 2014-12-21 MED ORDER — SODIUM CHLORIDE 0.9 % IV SOLN
INTRAVENOUS | Status: DC
  Administered 2014-12-21: 19:00:00 via INTRAVENOUS

## 2014-12-21 MED ORDER — PROMETHAZINE HCL 25 MG/ML IJ SOLN
12.5000 mg | Freq: Once | INTRAMUSCULAR | Status: AC
  Administered 2014-12-21: 12.5 mg via INTRAVENOUS
  Filled 2014-12-21: qty 1

## 2014-12-21 MED ORDER — ONDANSETRON 4 MG PO TBDP: 4.0000 mg | ORAL_TABLET | Freq: Three times a day (TID) | ORAL | Status: DC | PRN

## 2014-12-21 MED ORDER — DICYCLOMINE HCL 10 MG/ML IM SOLN
20.0000 mg | Freq: Once | INTRAMUSCULAR | Status: AC
  Administered 2014-12-21: 20 mg via INTRAMUSCULAR
  Filled 2014-12-21: qty 2

## 2014-12-21 MED ORDER — DICYCLOMINE HCL 20 MG PO TABS: 20.0000 mg | ORAL_TABLET | Freq: Four times a day (QID) | ORAL | Status: DC | PRN

## 2014-12-21 NOTE — Discharge Instructions (Signed)
°Emergency Department Resource Guide °1) Find a Doctor and Pay Out of Pocket °Although you won't have to find out who is covered by your insurance plan, it is a good idea to ask around and get recommendations. You will then need to call the office and see if the doctor you have chosen will accept you as a new patient and what types of options they offer for patients who are self-pay. Some doctors offer discounts or will set up payment plans for their patients who do not have insurance, but you will need to ask so you aren't surprised when you get to your appointment. ° °2) Contact Your Local Health Department °Not all health departments have doctors that can see patients for sick visits, but many do, so it is worth a call to see if yours does. If you don't know where your local health department is, you can check in your phone book. The CDC also has a tool to help you locate your state's health department, and many state websites also have listings of all of their local health departments. ° °3) Find a Walk-in Clinic °If your illness is not likely to be very severe or complicated, you may want to try a walk in clinic. These are popping up all over the country in pharmacies, drugstores, and shopping centers. They're usually staffed by nurse practitioners or physician assistants that have been trained to treat common illnesses and complaints. They're usually fairly quick and inexpensive. However, if you have serious medical issues or chronic medical problems, these are probably not your best option. ° °No Primary Care Doctor: °- Call [facility] at  832-8000 - they can help you locate a primary care doctor that  accepts your insurance, provides certain services, etc. °- Physician Referral Service- 1-800-533-3463 ° °Chronic Pain Problems: °Organization         Address  Phone   Notes  °[facility]  (336) 297-2271 Patients need to be referred by their primary care doctor.  ° °Medication  Assistance: °Organization         Address  Phone   Notes  °[place] [facility] [address] °[place], [place] [address] (336) 641-8030 --Must be a resident of [place] °-- Must have NO insurance coverage whatsoever (no Medicaid/ Medicare, etc.) °-- The pt. MUST have a primary care doctor that directs their care regularly and follows them in the community °  °[facility]  (866) 331-1348   °[employer]  (888) 892-1162   ° °Agencies that provide inexpensive medical care: °Organization         Address  Phone   Notes  °[facility]  (336) 832-8035   °[facility]    (336) 832-7272   °[facility] [address] °[place], [place] [address] (336) 832-4777   °[facility] [address], °[place] (336) 271-4999   °[facility]    (336) 373-0678   °[facility]    (336) 272-1050   °[facility] ° [address], [place] Phone:  (336) 832-4444, Fax:  (336) 832-4440 Hours of Operation:  9 am - 6 pm, M-F.  Also accepts Medicaid/Medicare and self-pay.  °[facility] ° [address], [place] Phone: (336) [phone], Fax: (336) [phone]. Hours of Operation:  8:30 am - 5:30 pm, M-F.  Also accepts Medicaid and self-pay.  °[facility] [place] [address], [place] Phone: (336) 878-6027   °[facility] [address], [place], [place] (336)723-1848, Ext. 123 Mondays & Thursdays: 7-9 AM.  First 15 patients are seen on a first come, first serve basis. °  ° °Medicaid-accepting [place] Providers: ° °Organization         Address  Phone   Notes  °[facility] [address], [place] (336) 641-2100 Also accepts self-pay patients.  °[facility] [address], [place] ° (336) 856-9996   °[facility] [address], [place]  (336) 288-8857   °[facility] [address] [place] [address], [place] (336) 299-7000   °Veita Bland [address], [place]  ° (336) 373-1557 Only accepts [place] Access Medicaid patients after they have their name applied to their card.  ° °Self-Pay (no insurance) in [place]: ° °Organization         Address  Phone   Notes  °Sickle Cell Patients, [facility] [address], [place] (336) 832-1970   °[facility] [address], [place] (336) 832-4400   ° [facility] ° [address], [place] (336) 992-4800   °[facility]/Dr. Osei-Bonsu ° [address] [place] [address], [place] or [address], [place] (336) [phone] Phone number for both [place] and [place] locations is the same.  °[facility] [address], [place] (336) 299-0000   °[facility] [address] [place] [address], [place] or [address] (336) 852-7530 °(336) 878-2260   °[facility] [address], [place] (336) 350-1642, phone; (336) 294-5005, fax Sees patients 1st and 3rd Saturday of every month.  Must not qualify for public or private insurance (i.e. Medicaid, Medicare, [place] Health Choice, Veterans' Benefits) • Household income should be no more than 200% of the poverty level •The clinic cannot treat you if you are pregnant or think you are pregnant • Sexually transmitted diseases are not treated at the clinic.  ° ° °Dental Care: °Organization         Address  Phone  Notes  °[place] [facility] [address], [place] (336) 641-6152 Accepts children up to age 21 who are enrolled in Medicaid or [place] Health Choice; pregnant women with a Medicaid card; and children who have applied for Medicaid or [place] Health Choice, but were declined, whose parents can pay a reduced fee at time of service.  °[place]  [facility] [place]  [address], [place] (336) 641-7733 Accepts children up to age 21 who are enrolled in Medicaid or [place] Health Choice; pregnant women with a Medicaid card; and children who have applied for Medicaid or [place] Health Choice, but were declined, whose parents can pay a reduced fee at time of service.  °[facility] ° [address],  (336) 641-4533 Patients are seen by appointment only. Walk-ins are not accepted. [facility] will see patients 18 years of age and older. °Monday - Tuesday (8am-5pm) °Most Wednesdays (8:30-5pm) °$30 per visit, cash only  °[facility] ° [address], [place] (336) 641-4533 Patients are seen by appointment only. Walk-ins are not accepted. [facility] will see patients 18 years of age and older. °One   Wednesday Evening (Monthly: Volunteer Based).  $30 per visit, cash only  °[facility]  (919) [phone] for adults; Children under age 4, call Graduate Pediatric Dentistry at ([phone]) [phone]. Children aged 4-14, please call ([phone]) [phone] to request a pediatric application. ° Dental services are provided in all areas of dental care including fillings, crowns and bridges, complete and partial dentures, implants, gum treatment, root canals, and extractions. Preventive care is also provided. Treatment is provided to both adults and children. °Patients are selected via a lottery and there is often a waiting list. °  °[facility] [address], °[place] ° (336) 763-8833 [email or web address] °  °[facility] [address], [place], [place] (336)723-1848, Ext. 123 Second and Fourth Thursday of each month, opens at 6:30 AM; Clinic ends at 9 AM.  Patients are seen on a first-come first-served basis, and a limited number are seen during each clinic.  ° °[facility] ° [address], [place], [place] (336) 723-7904    Eligibility Requirements °You must have lived in [place], [place], or [place] counties for at least the last three months. °  You cannot be eligible for state or federal sponsored healthcare insurance, including Veterans Administration, Medicaid, or Medicare. °  You generally cannot be eligible for healthcare insurance through your employer.  °  How to apply: °Eligibility screenings are held every Tuesday and Wednesday afternoon from 1:00 pm until 4:00 pm. You do not need an appointment for the interview!  °[facility] [address], [place], [place] 336-631-2330   °[facility]  336-342-8273   °[facility]  336-703-3100   °[facility]  336-570-6415   ° °Behavioral Health Resources in the Community: °Intensive Outpatient Programs °Organization         Address  Phone  Notes  °[facility] [address], [place], [place] 336-878-6098   °[facility] [address], [place], [place] 336-832-9800   °[facility] [address], [place], [place] ° 336-882-2125   °[facility] [address],  °[place], [place] 1-800-853-5163 or 336-641-4981   °Substance Abuse Resources °Organization         Address  Phone  Notes  °[facility]  336-882-2125   °[facility]  336-784-9470   °[facility]  336-285-9073   °[facility]  336-845-3988   °Residential & Outpatient Substance Abuse Program  1-800-659-3381   °Psychological Services °Organization         Address  Phone  Notes  °[facility]  336- 832-9600   °[facility]  336- 378-7881   °[facility] [address], [place] 1-800-853-5163 or 336-641-4981   ° °Mobile Crisis Teams °Organization         Address  Phone  Notes  °[facility], Mobile Crisis Care Unit  1-877-626-1772   °Assertive °[facility] ° [address]  [place], [place] 336-834-9664   °Sharon DeEsch [address] °[place] 336-554-5454   ° °Self-Help/Support Groups °Organization         Address  Phone             Notes  °[facility] [place] - variety of support groups  336- 373-1402 Call for more information  °Narcotics Anonymous (NA), [facility] [address], °[place]  2 meetings at this location  ° °  Residential Treatment Programs Organization         Address  Phone  Notes  [facility] [address]    [place]  1-970-732-6309   [facility]  [address], [place] [address], [place], [place]   [place], [place] 901-039-1032 Admissions: 8am-3pm M-F  [facility] [place] [address],    [place], [place] 341-937-9024   [facility] [address] [place], [place], [place]   [facility] [address],  [place], [place]   [facility] - Intensive Outpatient [address], [place], [place]   [facility] ([place] [address]  [place], [place] 1-8185190798 or 510-636-7610   [facility] [address] [place], [place] Accepts Medicaid  [facility] [address]  [place] 1-820-091-4921 Substance Abuse/Addiction Treatment   [facility]         Address  Phone  Notes  [facility]  508-609-0654   Domenic Schwab, PhD [address] Arlis [place], [place]   (814)268-4551 or 506-392-2801   [facility] [place], [place] 760-209-1574   [facility] [address] [place], [place] (802)168-0422 Insurance/Medicaid/sponsorship through [facility] [address]                                    [place], [place] (734) [phone] [place] [address], [place] (812)[phone]    Dr. Adele Schilder  201-380-5439   [facility] Dept. 1) [address] [place] [address], [place] 2) Red Cross 3)  [address], [place] 281-395-6500 8722004710  437-321-4066   [place] 810-785-5174 or (614) 722-8258 (After Hours)      Eat a bland diet, avoiding greasy, fatty, fried foods, as well as spicy and acidic foods or beverages.  Avoid eating within the hour or 2 before going to bed or laying down.  Also avoid teas, colas, coffee, chocolate, pepermint and spearment.  Take over the counter pepcid, one tablet by mouth twice a day, for the next 3 to 4 weeks.  May also take over the counter maalox/mylanta, as directed on packaging, as needed for discomfort.  Take the prescriptions as directed.  Call your regular medical doctor and your GI doctor tomorrow to schedule a follow up appointment in the next 3 days.  Return to the Emergency Department immediately if worsening.

## 2014-12-21 NOTE — ED Provider Notes (Signed)
CSN: [id number]     Arrival date & time 12/21/14  1808 History   First MD Initiated Contact with Patient 12/21/14 1817     Chief Complaint  Patient presents with  . Abdominal Pain      HPI  Pt was seen at 1820.  Per pt, c/o gradual onset and persistence of constant acute flair of her chronic upper abd "pain" for the past several days, worse since yesterday.  Has been associated with multiple intermittent episodes of N/V.  Describes the abd pain as "aching," "throbbing," and "sharp" with radiation into her back.  Denies diarrhea, no fevers, no back pain, no rash, no CP/SOB, no black or blood in stools.  The symptoms have been associated with no other complaints. The patient has a significant history of similar symptoms previously, recently being evaluated for this complaint and multiple prior evals for same.  Pt states she was seen by her GI Dr Collene Mares last month for similar symptoms and "had my stomach medicines changed around." Denies any change in her usual chronic pain pattern. The symptoms have been associated with no other complaints. The patient has a significant history of similar symptoms previously, recently being evaluated for this complaint and multiple prior evals for same.      Past Medical History  Diagnosis Date  . History of cocaine abuse 09/28/2010  . GERD (gastroesophageal reflux disease)   . IBS (irritable bowel syndrome)   . Depression   . Mood swings ([place])   . Hypertension   . Chronic abdominal pain    Past Surgical History  Procedure Laterality Date  . Appendectomy    . Cholecystectomy    . Tubal ligation    . Tonsillectomy     Family History  Problem Relation Age of Onset  . Hypertension Mother   . Heart disease Father 22    MI  . Stroke Father 28  . Diabetes Father   . GER disease Sister   . Cancer Maternal Grandmother     uterine and stomach   Social History  Substance Use Topics  . Smoking status: Current Every Day Smoker -- 0.30 packs/day    Types:  Cigarettes  . Smokeless tobacco: Not on file  . Alcohol Use: No     Comment: occ   OB History    Gravida Para Term Preterm AB TAB SAB Ectopic Multiple Living   0 0 0 0 0 0 0 0       Review of Systems ROS: Statement: All systems negative except as marked or noted in the HPI; Constitutional: Negative for fever and chills. ; ; Eyes: Negative for eye pain, redness and discharge. ; ; ENMT: Negative for ear pain, hoarseness, nasal congestion, sinus pressure and sore throat. ; ; Cardiovascular: Negative for chest pain, palpitations, diaphoresis, dyspnea and peripheral edema. ; ; Respiratory: Negative for cough, wheezing and stridor. ; ; Gastrointestinal: +N/V, abd pain. Negative for diarrhea, blood in stool, hematemesis, jaundice and rectal bleeding. . ; ; Genitourinary: Negative for dysuria, flank pain and hematuria. ; ; Musculoskeletal: Negative for back pain and neck pain. Negative for swelling and trauma.; ; Skin: Negative for pruritus, rash, abrasions, blisters, bruising and skin lesion.; ; Neuro: Negative for headache, lightheadedness and neck stiffness. Negative for weakness, altered level of consciousness , altered mental status, extremity weakness, paresthesias, involuntary movement, seizure and syncope.      Allergies  Hydrocodone and Hydrocodone  Home Medications   Prior to Admission medications   Medication Sig  Start Date End Date Taking? Authorizing Provider  acetaminophen (TYLENOL) 325 MG tablet Take 650 mg by mouth every 6 (six) hours as needed for mild pain.   Yes Historical Provider, MD  alum hydroxide-mag trisilicate (GAVISCON) 10-30 MG CHEW chewable tablet Chew 1 tablet by mouth 3 (three) times daily as needed for indigestion or heartburn.   Yes Historical Provider, MD  [place] alk-PHENObarbital (DONNATAL) 16.2 MG tablet Take 1 tablet by mouth every 8 (eight) hours as needed (stomach pain).   Yes Historical Provider, MD  calcium carbonate (TUMS - DOSED IN MG ELEMENTAL CALCIUM)  500 MG chewable tablet Chew 2 tablets by mouth 2 (two) times daily as needed for heartburn.   Yes Historical Provider, MD  cephALEXin (KEFLEX) 500 MG capsule Take 1 capsule (500 mg total) by mouth 2 (two) times daily. Patient taking differently: Take 500 mg by mouth 2 (two) times daily. Patient cannot remember when she started medication 07/24/14   Merryl Hacker, MD  cephALEXin (KEFLEX) 500 MG capsule Take 1 capsule (500 mg total) by mouth 2 (two) times daily. Patient not taking: Reported on 12/21/2014 11/27/14   Elnora Morrison, MD  hydrochlorothiazide (HYDRODIURIL) 25 MG tablet Take 25 mg by mouth every morning.   Yes Historical Provider, MD  hydrOXYzine (ATARAX/VISTARIL) 25 MG tablet Take 1 tablet (25 mg total) by mouth every 8 (eight) hours as needed. 06/24/14  Yes Tori Milks, MD  naproxen sodium (ANAPROX) 220 MG tablet Take 220 mg by mouth 2 (two) times daily as needed (pain).   Yes Historical Provider, MD  omeprazole (PRILOSEC) 20 MG capsule Take 1 capsule (20 mg total) by mouth daily. Patient not taking: Reported on 12/21/2014 08/04/14   Montine Circle, PA-C  omeprazole (PRILOSEC) 40 MG capsule Take 40 mg by mouth daily.   Yes Historical Provider, MD  ondansetron (ZOFRAN ODT) 4 MG disintegrating tablet 31m ODT q4 hours prn nausea/vomit 11/27/14  Yes JElnora Morrison MD  oxyCODONE-acetaminophen (PERCOCET) 7.5-325 MG per tablet Take 2 tablets by mouth every 4 (four) hours as needed for moderate pain or severe pain.   Yes Historical Provider, MD  permethrin (ELIMITE) 5 % cream Apply to entire body other than face - let sit for 14 hours then wash off, may repeat in 1 week if still having symptoms Patient not taking: Reported on 12/21/2014 07/21/14   RMontine Circle PA-C  sucralfate (CARAFATE) 1 G tablet Take 1 tablet (1 g total) by mouth 4 (four) times daily -  with meals and at bedtime. 08/04/14  Yes RMontine Circle PA-C  traMADol (ULTRAM) 50 MG tablet Take 1 tablet (50 mg total) by mouth every 6  (six) hours as needed. Patient not taking: Reported on 12/21/2014 08/04/14   RMontine Circle PA-C   BP 146/101 mmHg  Pulse 70  Temp(Src) 99 F (37.2 C) (Oral)  Resp 18  Ht 5' 1" (1.549 m)  Wt 130 lb (58.968 kg)  BMI 24.58 kg/m2  SpO2 100%  LMP 11/08/2014 Physical Exam  1825: Physical examination:  Nursing notes reviewed; Vital signs and O2 SAT reviewed;  Constitutional: Well developed, Well nourished, Well hydrated, uncomfortable appearing.; Head:  Normocephalic, atraumatic; Eyes: EOMI, PERRL, No scleral icterus; ENMT: Mouth and pharynx normal, Mucous membranes moist; Neck: Supple, Full range of motion, No lymphadenopathy; Cardiovascular: Regular rate and rhythm, No murmur, rub, or gallop; Respiratory: Breath sounds clear & equal bilaterally, No rales, rhonchi, wheezes.  Speaking full sentences with ease, Normal respiratory effort/excursion; Chest: Nontender, Movement normal; Abdomen: Soft, +mild mid-epigastric  tenderness to palp. No rebound or guarding. Nondistended, Normal bowel sounds; Genitourinary: No CVA tenderness; Extremities: Pulses normal, No tenderness, No edema, No calf edema or asymmetry.; Neuro: AA&Ox3, Major CN grossly intact.  Speech clear. No gross focal motor or sensory deficits in extremities.; Skin: Color normal, Warm, Dry.   ED Course  Procedures (including critical care time) Labs Review   Imaging Review  I have personally reviewed and evaluated these images and lab results as part of my medical decision-making.   EKG Interpretation None      MDM  MDM Reviewed: previous chart, nursing note and vitals Reviewed previous: labs and ECG Interpretation: labs, ECG and x-ray     ED ECG REPORT   Date: 12/21/2014  Rate: 73  Rhythm: normal sinus rhythm  QRS Axis: normal  Intervals: normal  ST/T Wave abnormalities: normal  Conduction Disutrbances:none  Narrative Interpretation:   Old EKG Reviewed: unchanged; no significant changes from previous EKG dated  06/04/2006.  I have personally reviewed the EKG tracing and agree with the computerized printout as noted.   Results for orders placed or performed during the hospital encounter of 12/21/14  Comprehensive metabolic panel  Result Value Ref Range   Sodium 138 135 - 145 mmol/L   Potassium 4.0 3.5 - 5.1 mmol/L   Chloride 106 101 - 111 mmol/L   CO2 26 22 - 32 mmol/L   Glucose, Bld 95 65 - 99 mg/dL   BUN 7 6 - 20 mg/dL   Creatinine, Ser 0.56 0.44 - 1.00 mg/dL   Calcium 9.1 8.9 - 10.3 mg/dL   Total Protein 7.7 6.5 - 8.1 g/dL   Albumin 4.1 3.5 - 5.0 g/dL   AST 20 15 - 41 U/L   ALT 10 (L) 14 - 54 U/L   Alkaline Phosphatase 55 38 - 126 U/L   Total Bilirubin 0.2 (L) 0.3 - 1.2 mg/dL   GFR calc non Af Amer >60 >60 mL/min   GFR calc Af Amer >60 >60 mL/min   Anion gap 6 5 - 15  Lipase, blood  Result Value Ref Range   Lipase 24 22 - 51 U/L  CBC with Differential  Result Value Ref Range   WBC 8.6 4.0 - 10.5 K/uL   RBC 3.90 3.87 - 5.11 MIL/uL   Hemoglobin 10.0 (L) 12.0 - 15.0 g/dL   HCT 31.6 (L) 36.0 - 46.0 %   MCV 81.0 78.0 - 100.0 fL   MCH 25.6 (L) 26.0 - 34.0 pg   MCHC 31.6 30.0 - 36.0 g/dL   RDW 18.0 (H) 11.5 - 15.5 %   Platelets 567 (H) 150 - 400 K/uL   Neutrophils Relative % 65 %   Neutro Abs 5.6 1.7 - 7.7 K/uL   Lymphocytes Relative 27 %   Lymphs Abs 2.3 0.7 - 4.0 K/uL   Monocytes Relative 7 %   Monocytes Absolute 0.6 0.1 - 1.0 K/uL   Eosinophils Relative 1 %   Eosinophils Absolute 0.1 0.0 - 0.7 K/uL   Basophils Relative 0 %   Basophils Absolute 0.0 0.0 - 0.1 K/uL  Pregnancy, urine  Result Value Ref Range   Preg Test, Ur NEGATIVE NEGATIVE  Urinalysis, Routine w reflex microscopic  Result Value Ref Range   Color, Urine YELLOW YELLOW   APPearance CLOUDY (A) CLEAR   Specific Gravity, Urine 1.014 1.005 - 1.030   pH 8.0 5.0 - 8.0   Glucose, UA NEGATIVE NEGATIVE mg/dL   Hgb urine dipstick NEGATIVE NEGATIVE   Bilirubin  Urine NEGATIVE NEGATIVE   Ketones, ur NEGATIVE NEGATIVE  mg/dL   Protein, ur NEGATIVE NEGATIVE mg/dL   Urobilinogen, UA 0.2 0.0 - 1.0 mg/dL   Nitrite NEGATIVE NEGATIVE   Leukocytes, UA TRACE (A) NEGATIVE  Troponin I  Result Value Ref Range   Troponin I <0.03 <0.031 ng/mL  Urine microscopic-add on  Result Value Ref Range   Squamous Epithelial / LPF RARE RARE   WBC, UA 7-10 <3 WBC/hpf   Bacteria, UA FEW (A) RARE   Urine-Other MUCOUS PRESENT    US Pelvis Complete 11/27/2014  CLINICAL DATA:  Epigastric pain with fluid in the endometrium and vagina on CT. Negative pregnancy test. Initial encounter. EXAM: TRANSABDOMINAL AND TRANSVAGINAL ULTRASOUND OF PELVIS TECHNIQUE: Both transabdominal and transvaginal ultrasound examinations of the pelvis were performed. Transabdominal technique was performed for global imaging of the pelvis including uterus, ovaries, adnexal regions, and pelvic cul-de-sac. It was necessary to proceed with endovaginal exam following the transabdominal exam to visualize the endometrium and ovaries to better advantage. COMPARISON:  Abdominal pelvic CT same date FINDINGS: Uterus Measurements: 8.9 x 5.0 x 6.8 cm. 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 No focal myometrial abnormality identified. The cervix and vagina were not evaluated. Endometrium Thickness: 15 mm.  No focal abnormality visualized. Right ovary Measurements: 2.7 x 1.9 x 2.6 cm. Normal appearance/no adnexal mass. Left ovary Measurements: 3.5 x 2.4 x 3.5 cm. Normal appearance/no adnexal mass. Other findings Trace free pelvic fluid, within physiologic limits. IMPRESSION: 1. Mild uterine heterogeneity and hypervascularity, with engorged ovarian vein collaterals on CT suspicious for pelvic congestion syndrome. 2. Mild nonspecific thickening of the endometrium, within physiologic limits. 3. The questioned fluid in the vagina on CT appears to be anteriorly positioned around the urethra on the sagittal images, suggesting a  urethral diverticulum. This is not evaluated by this examination. 4. No suspicious adnexal findings Electronically Signed   By: Richardean Sale M.D.   On: 11/27/2014 19:52   Ct Abdomen Pelvis W Contrast 11/27/2014  CLINICAL DATA:  Severe epigastric pain for 2 days. EXAM: CT ABDOMEN AND PELVIS WITH CONTRAST TECHNIQUE: Multidetector CT imaging of the abdomen and pelvis was performed using the standard protocol following bolus administration of intravenous contrast. CONTRAST:  125m OMNIPAQUE IOHEXOL 300 MG/ML  SOLN COMPARISON:  None. FINDINGS: Lower chest:  Unremarkable. Hepatobiliary: No masses or other significant abnormality identified. There has been a prior cholecystectomy. Pancreas: No evidence of mass, inflammatory changes, or other significant abnormality. Spleen:  Within normal limits in size and appearance. Adrenal Glands:  No masses identified. Kidneys/Urinary Tract: No evidence of urolithiasis or hydronephrosis. No solid or complex cystic renal masses identified. No masses or calculi seen involving the lower urinary tract. Stomach/Bowel/Peritoneum: No evidence of wall thickening, mass, or obstruction. Vascular/Lymphatic: No pathologically enlarged lymph nodes identified. No other significant abnormality noted. Reproductive: The endometrial canal is fluid-filled. Higher density fluid is also seen within the vagina. No evidence of adnexal masses. Left ovary is somewhat unusually positioned high within the pelvis. Other: There is a 1.7 by 2.4 by 2.3 cm narrow neck fat containing supraumbilical mid anterior abdominal wall hernia with evidence of fat stranding within the enclosed fat. Musculoskeletal:  No suspicious bone lesions identified. IMPRESSION: Normal appearance of solid abdominal organs. Normal appearance of the bowel. 2.4 cm fat containing narrow neck supraumbilical anterior abdominal wall hernia, with possible strangulation of the entrapped fat. Fluid within the endometrial canal and vagina. Please  correlate clinically. Somewhat  unusual location of the left ovary high within the pelvis. This may be an anatomic variant, however pelvic ultrasound may be considered if there is any clinical concern of gynecological abnormality. Electronically Signed   By: Fidela Salisbury M.D.   On: 11/27/2014 17:46   Dg Abd Acute W/chest 12/21/2014  CLINICAL DATA:  Abdominal and epigastric pain since yesterday. Gastroesophageal reflux disease. Hypertension. EXAM: DG ABDOMEN ACUTE W/ 1V CHEST COMPARISON:  08/09/2014 FINDINGS: There is no evidence of dilated bowel loops or free intraperitoneal air. Multiple pelvic phleboliths noted. No radiopaque calculi or other significant radiographic abnormality is seen. Heart size and mediastinal contours are within normal limits. Both lungs are clear. IMPRESSION: Normal bowel gas pattern.  No acute findings. No active cardiopulmonary disease. Electronically Signed   By: Earle Gell M.D.   On: 12/21/2014 21:07    2315:  Pt has tol PO well while in the ED without N/V.  No stooling while in the ED.  Abd benign, VSS. Feels better and wants to go home now. Dx and testing d/w pt.  Questions answered.  Verb understanding, agreeable to d/c home with outpt f/u.    Francine Graven, DO 12/24/14 [phone]

## 2014-12-21 NOTE — ED Notes (Signed)
Per [facility], pt. Has had epigastric pain since yesterday. Hx of IBS, ulcers, HTN. SOB reported by pt. with no visible signs. vss.

## 2014-12-21 NOTE — ED Notes (Signed)
Bed: VF64 Expected date:  Expected time:  Means of arrival:  Comments: EMS0 39yo F, abdominal pain/SOB

## 2014-12-22 NOTE — ED Notes (Signed)
Patient reports difficulty obtaining transportation home. Patient was advised she may use the phone in the lobby to continue looking for a ride. Patient was requested to get dressed and come to the nurses station for directions to the exit once dressed. Will con't to monitor for patient need with assistance in dressing.

## 2015-05-10 DIAGNOSIS — K269 Duodenal ulcer, unspecified as acute or chronic, without hemorrhage or perforation: Secondary | ICD-10-CM

## 2015-05-10 DIAGNOSIS — D649 Anemia, unspecified: Secondary | ICD-10-CM

## 2015-05-10 HISTORY — DX: Duodenal ulcer, unspecified as acute or chronic, without hemorrhage or perforation: K26.9

## 2015-05-10 HISTORY — DX: Anemia, unspecified: D64.9

## 2015-06-01 ENCOUNTER — Encounter (HOSPITAL_COMMUNITY): Payer: Self-pay

## 2015-06-01 ENCOUNTER — Emergency Department (HOSPITAL_COMMUNITY): Payer: Self-pay

## 2015-06-01 ENCOUNTER — Emergency Department (HOSPITAL_COMMUNITY)
Admission: EM | Admit: 2015-06-01 | Discharge: 2015-06-02 | Disposition: A | Discharge status: Home / Self Care | Payer: Self-pay | Attending: Emergency Medicine | Admitting: Emergency Medicine

## 2015-06-01 DIAGNOSIS — Z9049 Acquired absence of other specified parts of digestive tract: Secondary | ICD-10-CM | POA: Insufficient documentation

## 2015-06-01 DIAGNOSIS — F329 Major depressive disorder, single episode, unspecified: Secondary | ICD-10-CM | POA: Insufficient documentation

## 2015-06-01 DIAGNOSIS — K219 Gastro-esophageal reflux disease without esophagitis: Secondary | ICD-10-CM | POA: Insufficient documentation

## 2015-06-01 DIAGNOSIS — F1410 Cocaine abuse, uncomplicated: Secondary | ICD-10-CM | POA: Insufficient documentation

## 2015-06-01 DIAGNOSIS — K2990 Gastroduodenitis, unspecified, without bleeding: Secondary | ICD-10-CM

## 2015-06-01 DIAGNOSIS — I10 Essential (primary) hypertension: Secondary | ICD-10-CM | POA: Insufficient documentation

## 2015-06-01 DIAGNOSIS — G8929 Other chronic pain: Secondary | ICD-10-CM | POA: Insufficient documentation

## 2015-06-01 DIAGNOSIS — N390 Urinary tract infection, site not specified: Secondary | ICD-10-CM | POA: Insufficient documentation

## 2015-06-01 DIAGNOSIS — F1210 Cannabis abuse, uncomplicated: Secondary | ICD-10-CM | POA: Insufficient documentation

## 2015-06-01 DIAGNOSIS — Z79899 Other long term (current) drug therapy: Secondary | ICD-10-CM | POA: Insufficient documentation

## 2015-06-01 DIAGNOSIS — F1910 Other psychoactive substance abuse, uncomplicated: Secondary | ICD-10-CM

## 2015-06-01 DIAGNOSIS — F17210 Nicotine dependence, cigarettes, uncomplicated: Secondary | ICD-10-CM | POA: Insufficient documentation

## 2015-06-01 DIAGNOSIS — Z3202 Encounter for pregnancy test, result negative: Secondary | ICD-10-CM | POA: Insufficient documentation

## 2015-06-01 DIAGNOSIS — K2980 Duodenitis without bleeding: Secondary | ICD-10-CM | POA: Insufficient documentation

## 2015-06-01 DIAGNOSIS — K589 Irritable bowel syndrome without diarrhea: Secondary | ICD-10-CM | POA: Insufficient documentation

## 2015-06-01 DIAGNOSIS — K2970 Gastritis, unspecified, without bleeding: Secondary | ICD-10-CM | POA: Insufficient documentation

## 2015-06-01 DIAGNOSIS — Z792 Long term (current) use of antibiotics: Secondary | ICD-10-CM | POA: Insufficient documentation

## 2015-06-01 LAB — I-STAT CHEM 8, ED
BUN: 8 mg/dL (ref 6–20)
CALCIUM ION: 1.11 mmol/L — AB (ref 1.12–1.23)
CREATININE: 0.6 mg/dL (ref 0.44–1.00)
Chloride: 97 mmol/L — ABNORMAL LOW (ref 101–111)
GLUCOSE: 89 mg/dL (ref 65–99)
HCT: 35 % — ABNORMAL LOW (ref 36.0–46.0)
Hemoglobin: 11.9 g/dL — ABNORMAL LOW (ref 12.0–15.0)
Potassium: 3.4 mmol/L — ABNORMAL LOW (ref 3.5–5.1)
SODIUM: 135 mmol/L (ref 135–145)
TCO2: 28 mmol/L (ref 0–100)

## 2015-06-01 LAB — I-STAT TROPONIN, ED: Troponin i, poc: 0 ng/mL (ref 0.00–0.08)

## 2015-06-01 LAB — POC URINE PREG, ED: Preg Test, Ur: NEGATIVE

## 2015-06-01 LAB — CBC
HEMATOCRIT: 29.9 % — AB (ref 36.0–46.0)
HEMOGLOBIN: 9.7 g/dL — AB (ref 12.0–15.0)
MCH: 26.4 pg (ref 26.0–34.0)
MCHC: 32.4 g/dL (ref 30.0–36.0)
MCV: 81.5 fL (ref 78.0–100.0)
Platelets: 794 10*3/uL — ABNORMAL HIGH (ref 150–400)
RBC: 3.67 MIL/uL — ABNORMAL LOW (ref 3.87–5.11)
RDW: 18.2 % — ABNORMAL HIGH (ref 11.5–15.5)
WBC: 13.5 10*3/uL — ABNORMAL HIGH (ref 4.0–10.5)

## 2015-06-01 MED ORDER — ONDANSETRON HCL 4 MG/2ML IJ SOLN
4.0000 mg | Freq: Once | INTRAMUSCULAR | Status: AC
  Administered 2015-06-02: 4 mg via INTRAVENOUS
  Filled 2015-06-01: qty 2

## 2015-06-01 MED ORDER — DICYCLOMINE HCL 10 MG/ML IM SOLN
20.0000 mg | Freq: Once | INTRAMUSCULAR | Status: AC
  Administered 2015-06-01: 20 mg via INTRAMUSCULAR
  Filled 2015-06-01: qty 2

## 2015-06-01 MED ORDER — IOHEXOL 300 MG/ML  SOLN
25.0000 mL | Freq: Once | INTRAMUSCULAR | Status: AC | PRN
  Administered 2015-06-01: 25 mL via ORAL

## 2015-06-01 MED ORDER — IOPAMIDOL (ISOVUE-300) INJECTION 61%
100.0000 mL | Freq: Once | INTRAVENOUS | Status: AC | PRN
  Administered 2015-06-02: 100 mL via INTRAVENOUS

## 2015-06-01 MED ORDER — SODIUM CHLORIDE 0.9 % IV BOLUS (SEPSIS)
1000.0000 mL | Freq: Once | INTRAVENOUS | Status: AC
  Administered 2015-06-02: 1000 mL via INTRAVENOUS

## 2015-06-01 NOTE — ED Provider Notes (Signed)
CSN: [id number]     Arrival date & time 06/01/15  2106 History   By signing my name below, I, Forrestine Him, attest that this documentation has been prepared under the direction and in the presence of Myrka Sylva, MD.  Electronically Signed: Forrestine Him, ED Scribe. 06/01/2015. 11:23 PM.   Chief Complaint  Patient presents with  . Chest Pain   Patient is a 40 y.o. female presenting with chest pain. The history is provided by the patient. No language interpreter was used.  Chest Pain Pain location:  Substernal area Pain quality: burning   Pain radiates to:  Does not radiate Pain radiates to the back: no   Pain severity:  Moderate Duration:  2 days Timing:  Constant Progression:  Unchanged Chronicity:  New Context: not breathing   Relieved by:  Nothing Worsened by:  Nothing tried Associated symptoms: abdominal pain, nausea and vomiting   Associated symptoms: no back pain, no cough, no fever and no shortness of breath   Risk factors: Ehlers-Danlos syndrome   Risk factors: not obese      HPI Comments: [name] is a 40 y.o. female with a PMHx of GERD and HTN who presents to the Emergency Department complaining of constant, ongoing chest pain x 2 days. Pt states "i feel like i have a knot in my chest". She also reports ongoing nausea, vomiting, episodes of hematemesis, and intermittent black colored stools. No aggravating or alleviating factors reported. OTC Tylenol and Ibuprofen attempted at home without any improvement. No recent fever, chills, dysuria, leg swelling or shortness of breath. No recent changes in diet or activity. Pt takes Omeprazole daily for history of GERD. She denies any recent missed doses. Last EGD 2-3 years ago.  PCP: Andrena Mews, MD   GASTROENTEROLOGIST: Dr. Meriel Pica  Past Medical History  Diagnosis Date  . History of cocaine abuse 09/28/2010  . GERD (gastroesophageal reflux disease)   . IBS (irritable bowel syndrome)   . Depression   . Mood  swings ([place])   . Hypertension   . Chronic abdominal pain    Past Surgical History  Procedure Laterality Date  . Appendectomy    . Cholecystectomy    . Tubal ligation    . Tonsillectomy     Family History  Problem Relation Age of Onset  . Hypertension Mother   . Heart disease Father 45    MI  . Stroke Father 44  . Diabetes Father   . GER disease Sister   . Cancer Maternal Grandmother     uterine and stomach   Social History  Substance Use Topics  . Smoking status: Current Every Day Smoker -- 0.30 packs/day    Types: Cigarettes  . Smokeless tobacco: None  . Alcohol Use: No     Comment: occ   OB History    Gravida Para Term Preterm AB TAB SAB Ectopic Multiple Living   0 0 0 0 0 0 0 0       Review of Systems  Constitutional: Negative for fever and chills.  Respiratory: Negative for cough and shortness of breath.   Cardiovascular: Positive for chest pain.  Gastrointestinal: Positive for nausea, vomiting and abdominal pain.  Genitourinary: Negative for dysuria.  Musculoskeletal: Negative for back pain.  Psychiatric/Behavioral: Negative for confusion.  All other systems reviewed and are negative.     Allergies  Hydrocodone and Hydrocodone  Home Medications   Prior to Admission medications   Medication Sig Start Date End Date Taking?  Authorizing Provider  acetaminophen (TYLENOL) 325 MG tablet Take 650 mg by mouth every 6 (six) hours as needed for mild pain.    Historical Provider, MD  alum hydroxide-mag trisilicate (GAVISCON) 15-17 MG CHEW chewable tablet Chew 1 tablet by mouth 3 (three) times daily as needed for indigestion or heartburn.    Historical Provider, MD  [place] alk-PHENObarbital (DONNATAL) 16.2 MG tablet Take 1 tablet by mouth every 8 (eight) hours as needed (stomach pain).    Historical Provider, MD  calcium carbonate (TUMS - DOSED IN MG ELEMENTAL CALCIUM) 500 MG chewable tablet Chew 2 tablets by mouth 2 (two) times daily as needed for heartburn.     Historical Provider, MD  cephALEXin (KEFLEX) 500 MG capsule Take 1 capsule (500 mg total) by mouth 2 (two) times daily. Patient taking differently: Take 500 mg by mouth 2 (two) times daily. Patient cannot remember when she started medication 07/24/14   Merryl Hacker, MD  cephALEXin (KEFLEX) 500 MG capsule Take 1 capsule (500 mg total) by mouth 2 (two) times daily. Patient not taking: Reported on 12/21/2014 11/27/14   Elnora Morrison, MD  dicyclomine (BENTYL) 20 MG tablet Take 1 tablet (20 mg total) by mouth every 6 (six) hours as needed for spasms (abdominal cramping). 12/21/14   Francine Graven, DO  hydrochlorothiazide (HYDRODIURIL) 25 MG tablet Take 25 mg by mouth every morning.    Historical Provider, MD  hydrOXYzine (ATARAX/VISTARIL) 25 MG tablet Take 1 tablet (25 mg total) by mouth every 8 (eight) hours as needed. 06/24/14   Tori Milks, MD  naproxen sodium (ANAPROX) 220 MG tablet Take 220 mg by mouth 2 (two) times daily as needed (pain).    Historical Provider, MD  omeprazole (PRILOSEC) 20 MG capsule Take 1 capsule (20 mg total) by mouth daily. Patient not taking: Reported on 12/21/2014 08/04/14   Montine Circle, PA-C  omeprazole (PRILOSEC) 40 MG capsule Take 40 mg by mouth daily.    Historical Provider, MD  ondansetron (ZOFRAN ODT) 4 MG disintegrating tablet 10m ODT q4 hours prn nausea/vomit 11/27/14   JElnora Morrison MD  ondansetron (ZOFRAN ODT) 4 MG disintegrating tablet Take 1 tablet (4 mg total) by mouth every 8 (eight) hours as needed for nausea or vomiting. 12/21/14   KFrancine Graven DO  oxyCODONE-acetaminophen (PERCOCET) 7.5-325 MG per tablet Take 2 tablets by mouth every 4 (four) hours as needed for moderate pain or severe pain.    Historical Provider, MD  permethrin (ELIMITE) 5 % cream Apply to entire body other than face - let sit for 14 hours then wash off, may repeat in 1 week if still having symptoms Patient not taking: Reported on 12/21/2014 07/21/14   RMontine Circle PA-C   promethazine (PHENERGAN) 25 MG suppository Place 1 suppository (25 mg total) rectally every 6 (six) hours as needed for nausea or vomiting. 12/21/14   KFrancine Graven DO  sucralfate (CARAFATE) 1 G tablet Take 1 tablet (1 g total) by mouth 4 (four) times daily -  with meals and at bedtime. 08/04/14   RMontine Circle PA-C  traMADol (ULTRAM) 50 MG tablet Take 1 tablet (50 mg total) by mouth every 6 (six) hours as needed. Patient not taking: Reported on 12/21/2014 08/04/14   RMontine Circle PA-C   Triage Vitals: BP 145/129 mmHg  Pulse 89  Temp(Src) 98 F (36.7 C) (Oral)  Resp 16  SpO2 100%   Physical Exam  Constitutional: She is oriented to person, place, and time. She appears well-developed and well-nourished. No distress.  HENT:  Head: Normocephalic and atraumatic.  Mouth/Throat: Oropharynx is clear and moist.  Eyes: EOM are normal. Pupils are equal, round, and reactive to light.  Neck: Normal range of motion. Neck supple.  No carotid bruits   Cardiovascular: Normal rate, regular rhythm and normal heart sounds.   No murmur heard. Pulmonary/Chest: Effort normal and breath sounds normal. No stridor. She has no wheezes. She has no rales.  Abdominal: Soft. She exhibits no distension and no mass. There is no tenderness. There is no rebound and no guarding.  Hyperactive bowel sounds into the thoracic cavity  No muphy's sign noted   Musculoskeletal: Normal range of motion.  Multiple pop marks noted without infection on extremities consistent with IV drug use.  Neurological: She is alert and oriented to person, place, and time.  Skin: Skin is warm and dry.  Psychiatric: She has a normal mood and affect. Judgment normal.  Nursing note and vitals reviewed.   ED Course  Procedures (including critical care time)  DIAGNOSTIC STUDIES: Oxygen Saturation is 100% on RA, Normal by my interpretation.    COORDINATION OF CARE: 11:12 PM- Will order CXR, blood work, and EKG. Discussed treatment  plan with pt at bedside and pt agreed to plan.     Labs Review Labs Reviewed  CBC - Abnormal; Notable for the following:    WBC 13.5 (*)    RBC 3.67 (*)    Hemoglobin 9.7 (*)    HCT 29.9 (*)    RDW 18.2 (*)    Platelets 794 (*)    All other components within normal limits  URINALYSIS, ROUTINE W REFLEX MICROSCOPIC (NOT AT [facility]) - Abnormal; Notable for the following:    APPearance CLOUDY (*)    Specific Gravity, Urine 1.031 (*)    Ketones, ur 40 (*)    Protein, ur 30 (*)    Nitrite POSITIVE (*)    Leukocytes, UA MODERATE (*)    All other components within normal limits  URINE RAPID DRUG SCREEN, HOSP PERFORMED - Abnormal; Notable for the following:    Cocaine POSITIVE (*)    Tetrahydrocannabinol POSITIVE (*)    All other components within normal limits  URINE MICROSCOPIC-ADD ON - Abnormal; Notable for the following:    Squamous Epithelial / LPF 0-5 (*)    Bacteria, UA FEW (*)    All other components within normal limits  I-STAT CHEM 8, ED - Abnormal; Notable for the following:    Potassium 3.4 (*)    Chloride 97 (*)    Calcium, Ion 1.11 (*)    Hemoglobin 11.9 (*)    HCT 35.0 (*)    All other components within normal limits  I-STAT TROPOININ, ED  POC URINE PREG, ED  Randolm Idol, ED    Imaging Review Dg Chest 2 View  06/01/2015  CLINICAL DATA:  40 year old female with chest pain EXAM: CHEST  2 VIEW COMPARISON:  Radiograph dated 12/21/2014 FINDINGS: The heart size and mediastinal contours are within normal limits. Both lungs are clear. The visualized skeletal structures are unremarkable. IMPRESSION: No active cardiopulmonary disease. Electronically Signed   By: Anner Crete M.D.   On: 06/01/2015 21:47   I have personally reviewed and evaluated these images and lab results as part of my medical decision-making.   EKG Interpretation   Date/Time:  Thursday June 01 2015 21:16:45 EDT Ventricular Rate:  91 PR Interval:  120 QRS Duration: 74 QT Interval:  336 QTC  Calculation: 413 R Axis:   30 Text  Interpretation:  Sinus rhythm Borderline T wave abnormalities No  significant change since last tracing Confirmed by FLOYD MD, DANIEL  [phone]) on 06/01/2015 10:34:45 PM      MDM   Final diagnoses:  None   Pt gave verbal permission to discuss blood work and urine drug screen in front of family. She states polysubstance abuse is not what is causing her weight loss at this time.  Maintains that the marijuana is helping her and that we do not know the whole story.    PERC negative wells 0 highly doubt PE.  Pain is not actually in the chest but in the abdomen and given the duration of symptoms ruled out for ACS given ongoing symptoms of > 8 hours with negative troponin and unchanged EKG.  Low heart score and MACE score.    Stop using drugs and follow up with your PMD and GI as directed.  Stable for discharge.  Strict return precautions given.    I personally performed the services described in this documentation, which was scribed in my presence. The recorded information has been reviewed and is accurate.     Veatrice Kells, MD 06/02/15 [id number]

## 2015-06-01 NOTE — ED Notes (Signed)
Pt made aware of need for urine sample.  

## 2015-06-01 NOTE — ED Notes (Signed)
Pt stated shes in to much pain to give a urine sample

## 2015-06-01 NOTE — ED Notes (Signed)
MD at bedside. 

## 2015-06-01 NOTE — ED Notes (Signed)
Bed: [id number] Expected date:  Expected time:  Means of arrival:  Comments: EMS 39yo F chest pain x 2 days - triage EKG WNL

## 2015-06-01 NOTE — ED Notes (Signed)
Pt complains of chest pain for two days, she has a twitching movement that she does when she has the chest pain, she states that she has esophageal cancer , but her family doesn't know any history.

## 2015-06-02 ENCOUNTER — Encounter (HOSPITAL_COMMUNITY): Payer: Self-pay | Admitting: Emergency Medicine

## 2015-06-02 LAB — URINALYSIS, ROUTINE W REFLEX MICROSCOPIC
Bilirubin Urine: NEGATIVE
GLUCOSE, UA: NEGATIVE mg/dL
Hgb urine dipstick: NEGATIVE
Ketones, ur: 40 mg/dL — AB
Nitrite: POSITIVE — AB
PH: 6.5 (ref 5.0–8.0)
Protein, ur: 30 mg/dL — AB
SPECIFIC GRAVITY, URINE: 1.031 — AB (ref 1.005–1.030)

## 2015-06-02 LAB — RAPID URINE DRUG SCREEN, HOSP PERFORMED
AMPHETAMINES: NOT DETECTED
BARBITURATES: NOT DETECTED
BENZODIAZEPINES: NOT DETECTED
COCAINE: POSITIVE — AB
Opiates: NOT DETECTED
Tetrahydrocannabinol: POSITIVE — AB

## 2015-06-02 LAB — I-STAT TROPONIN, ED: TROPONIN I, POC: 0 ng/mL (ref 0.00–0.08)

## 2015-06-02 LAB — URINE MICROSCOPIC-ADD ON

## 2015-06-02 MED ORDER — SUCRALFATE 1 GM/10ML PO SUSP
1.0000 g | Freq: Three times a day (TID) | ORAL | Status: DC
Start: 2015-06-02 — End: 2015-06-07

## 2015-06-02 MED ORDER — ONDANSETRON 8 MG PO TBDP: ORAL_TABLET | ORAL | Status: DC

## 2015-06-02 MED ORDER — GI COCKTAIL ~~LOC~~
30.0000 mL | Freq: Once | ORAL | Status: AC
Start: 2015-06-02 — End: 2015-06-02
  Administered 2015-06-02: 30 mL via ORAL
  Filled 2015-06-02: qty 30

## 2015-06-02 MED ORDER — KETOROLAC TROMETHAMINE 30 MG/ML IJ SOLN
30.0000 mg | Freq: Once | INTRAMUSCULAR | Status: AC
  Administered 2015-06-02: 30 mg via INTRAVENOUS
  Filled 2015-06-02: qty 1

## 2015-06-02 MED ORDER — DEXTROSE 5 % IV SOLN
1.0000 g | Freq: Once | INTRAVENOUS | Status: AC
  Administered 2015-06-02: 1 g via INTRAVENOUS
  Filled 2015-06-02: qty 10

## 2015-06-02 NOTE — ED Notes (Signed)
Pt transported to CT ?

## 2015-06-02 NOTE — ED Notes (Addendum)
This nurse accompanied MD Palumbo to bedside with pt, pt's mother, and pt's daughter present. Pt verbalized that it was okay for MD to discuss labs and test results with family present.

## 2015-06-02 NOTE — ED Notes (Signed)
Pt returned from CT °

## 2015-06-03 ENCOUNTER — Encounter (HOSPITAL_COMMUNITY): Payer: Self-pay | Admitting: Emergency Medicine

## 2015-06-03 ENCOUNTER — Inpatient Hospital Stay (HOSPITAL_COMMUNITY)
Admission: EM | Admit: 2015-06-03 | Discharge: 2015-06-07 | DRG: 378 | Disposition: A | Discharge status: Home / Self Care | Payer: Self-pay | Attending: Internal Medicine | Admitting: Internal Medicine

## 2015-06-03 DIAGNOSIS — Z79899 Other long term (current) drug therapy: Secondary | ICD-10-CM

## 2015-06-03 DIAGNOSIS — N390 Urinary tract infection, site not specified: Secondary | ICD-10-CM | POA: Diagnosis present

## 2015-06-03 DIAGNOSIS — D473 Essential (hemorrhagic) thrombocythemia: Secondary | ICD-10-CM | POA: Diagnosis present

## 2015-06-03 DIAGNOSIS — F17210 Nicotine dependence, cigarettes, uncomplicated: Secondary | ICD-10-CM | POA: Diagnosis present

## 2015-06-03 DIAGNOSIS — B9681 Helicobacter pylori [H. pylori] as the cause of diseases classified elsewhere: Secondary | ICD-10-CM | POA: Diagnosis present

## 2015-06-03 DIAGNOSIS — F1410 Cocaine abuse, uncomplicated: Secondary | ICD-10-CM | POA: Diagnosis present

## 2015-06-03 DIAGNOSIS — D649 Anemia, unspecified: Secondary | ICD-10-CM

## 2015-06-03 DIAGNOSIS — E876 Hypokalemia: Secondary | ICD-10-CM | POA: Diagnosis present

## 2015-06-03 DIAGNOSIS — Z720 Tobacco use: Secondary | ICD-10-CM | POA: Diagnosis present

## 2015-06-03 DIAGNOSIS — K264 Chronic or unspecified duodenal ulcer with hemorrhage: Principal | ICD-10-CM | POA: Diagnosis present

## 2015-06-03 DIAGNOSIS — D7589 Other specified diseases of blood and blood-forming organs: Secondary | ICD-10-CM | POA: Diagnosis present

## 2015-06-03 DIAGNOSIS — Z6823 Body mass index (BMI) 23.0-23.9, adult: Secondary | ICD-10-CM

## 2015-06-03 DIAGNOSIS — Z9049 Acquired absence of other specified parts of digestive tract: Secondary | ICD-10-CM

## 2015-06-03 DIAGNOSIS — Z809 Family history of malignant neoplasm, unspecified: Secondary | ICD-10-CM

## 2015-06-03 DIAGNOSIS — R109 Unspecified abdominal pain: Secondary | ICD-10-CM

## 2015-06-03 DIAGNOSIS — F1910 Other psychoactive substance abuse, uncomplicated: Secondary | ICD-10-CM | POA: Diagnosis present

## 2015-06-03 DIAGNOSIS — I10 Essential (primary) hypertension: Secondary | ICD-10-CM | POA: Diagnosis present

## 2015-06-03 DIAGNOSIS — G8929 Other chronic pain: Secondary | ICD-10-CM | POA: Diagnosis present

## 2015-06-03 DIAGNOSIS — F1290 Cannabis use, unspecified, uncomplicated: Secondary | ICD-10-CM | POA: Diagnosis present

## 2015-06-03 DIAGNOSIS — K269 Duodenal ulcer, unspecified as acute or chronic, without hemorrhage or perforation: Secondary | ICD-10-CM | POA: Insufficient documentation

## 2015-06-03 DIAGNOSIS — D62 Acute posthemorrhagic anemia: Secondary | ICD-10-CM | POA: Diagnosis present

## 2015-06-03 DIAGNOSIS — T39395A Adverse effect of other nonsteroidal anti-inflammatory drugs [NSAID], initial encounter: Secondary | ICD-10-CM | POA: Diagnosis present

## 2015-06-03 DIAGNOSIS — K922 Gastrointestinal hemorrhage, unspecified: Secondary | ICD-10-CM

## 2015-06-03 DIAGNOSIS — D75839 Thrombocytosis, unspecified: Secondary | ICD-10-CM | POA: Diagnosis present

## 2015-06-03 DIAGNOSIS — E440 Moderate protein-calorie malnutrition: Secondary | ICD-10-CM | POA: Diagnosis present

## 2015-06-03 HISTORY — DX: Duodenal ulcer, unspecified as acute or chronic, without hemorrhage or perforation: K26.9

## 2015-06-03 HISTORY — DX: Anemia, unspecified: D64.9

## 2015-06-03 HISTORY — DX: Other psychoactive substance abuse, uncomplicated: F19.10

## 2015-06-03 HISTORY — DX: Esophagitis, unspecified: K20.9

## 2015-06-03 HISTORY — DX: Obesity, unspecified: E66.9

## 2015-06-03 LAB — URINALYSIS, ROUTINE W REFLEX MICROSCOPIC
BILIRUBIN URINE: NEGATIVE
Glucose, UA: NEGATIVE mg/dL
Ketones, ur: NEGATIVE mg/dL
NITRITE: NEGATIVE
PROTEIN: NEGATIVE mg/dL
SPECIFIC GRAVITY, URINE: 1.024 (ref 1.005–1.030)
pH: 6 (ref 5.0–8.0)

## 2015-06-03 LAB — COMPREHENSIVE METABOLIC PANEL
ALK PHOS: 48 U/L (ref 38–126)
ALT: 11 U/L — ABNORMAL LOW (ref 14–54)
AST: 14 U/L — ABNORMAL LOW (ref 15–41)
Albumin: 3.3 g/dL — ABNORMAL LOW (ref 3.5–5.0)
Anion gap: 8 (ref 5–15)
BILIRUBIN TOTAL: 0.1 mg/dL — AB (ref 0.3–1.2)
BUN: 22 mg/dL — ABNORMAL HIGH (ref 6–20)
CALCIUM: 8.5 mg/dL — AB (ref 8.9–10.3)
CO2: 24 mmol/L (ref 22–32)
Chloride: 105 mmol/L (ref 101–111)
Creatinine, Ser: 0.59 mg/dL (ref 0.44–1.00)
GFR calc non Af Amer: >60 mL/min (ref >60)
GLUCOSE: 130 mg/dL — AB (ref 65–99)
Potassium: 3.7 mmol/L (ref 3.5–5.1)
Sodium: 137 mmol/L (ref 135–145)
TOTAL PROTEIN: 6.5 g/dL (ref 6.5–8.1)

## 2015-06-03 LAB — PROTIME-INR
INR: 1.16 (ref 0.00–1.49)
Prothrombin Time: 14.5 seconds (ref 11.6–15.2)

## 2015-06-03 LAB — CBC
HCT: 19 % — ABNORMAL LOW (ref 36.0–46.0)
HEMOGLOBIN: 6.1 g/dL — AB (ref 12.0–15.0)
MCH: 25.7 pg — ABNORMAL LOW (ref 26.0–34.0)
MCHC: 32.1 g/dL (ref 30.0–36.0)
MCV: 80.2 fL (ref 78.0–100.0)
PLATELETS: 562 10*3/uL — AB (ref 150–400)
RBC: 2.37 MIL/uL — AB (ref 3.87–5.11)
RDW: 17.8 % — ABNORMAL HIGH (ref 11.5–15.5)
WBC: 11.2 10*3/uL — ABNORMAL HIGH (ref 4.0–10.5)

## 2015-06-03 LAB — RAPID URINE DRUG SCREEN, HOSP PERFORMED
Amphetamines: NOT DETECTED
BARBITURATES: POSITIVE — AB
BENZODIAZEPINES: NOT DETECTED
Cocaine: POSITIVE — AB
Opiates: POSITIVE — AB
Tetrahydrocannabinol: POSITIVE — AB

## 2015-06-03 LAB — URINE MICROSCOPIC-ADD ON

## 2015-06-03 LAB — PREPARE RBC (CROSSMATCH)

## 2015-06-03 LAB — LIPASE, BLOOD: Lipase: 30 U/L (ref 11–51)

## 2015-06-03 LAB — I-STAT CG4 LACTIC ACID, ED: LACTIC ACID, VENOUS: 1.14 mmol/L (ref 0.5–2.0)

## 2015-06-03 LAB — ABO/RH: ABO/RH(D): O NEG

## 2015-06-03 LAB — POC OCCULT BLOOD, ED: FECAL OCCULT BLD: POSITIVE — AB

## 2015-06-03 LAB — APTT: APTT: 30 s (ref 24–37)

## 2015-06-03 MED ORDER — SODIUM CHLORIDE 0.9 % IV SOLN
8.0000 mg/h | INTRAVENOUS | Status: DC
  Administered 2015-06-03: 8 mg/h via INTRAVENOUS
  Filled 2015-06-03 (×2): qty 80

## 2015-06-03 MED ORDER — ACETAMINOPHEN 650 MG RE SUPP: 650.0000 mg | Freq: Four times a day (QID) | RECTAL | Status: DC | PRN

## 2015-06-03 MED ORDER — SODIUM CHLORIDE 0.9 % IV SOLN
80.0000 mg | Freq: Once | INTRAVENOUS | Status: AC
  Administered 2015-06-03: 80 mg via INTRAVENOUS
  Filled 2015-06-03: qty 80

## 2015-06-03 MED ORDER — ONDANSETRON HCL 4 MG PO TABS: 4.0000 mg | ORAL_TABLET | Freq: Four times a day (QID) | ORAL | Status: DC | PRN

## 2015-06-03 MED ORDER — SODIUM CHLORIDE 0.9 % IV BOLUS (SEPSIS)
1000.0000 mL | Freq: Once | INTRAVENOUS | Status: AC
  Administered 2015-06-03: 1000 mL via INTRAVENOUS

## 2015-06-03 MED ORDER — PANTOPRAZOLE SODIUM 40 MG IV SOLR: 40.0000 mg | Freq: Two times a day (BID) | INTRAVENOUS | Status: DC

## 2015-06-03 MED ORDER — SODIUM CHLORIDE 0.9 % IV SOLN
10.0000 mL/h | Freq: Once | INTRAVENOUS | Status: AC
  Administered 2015-06-03: 10 mL/h via INTRAVENOUS

## 2015-06-03 MED ORDER — MORPHINE SULFATE (PF) 4 MG/ML IV SOLN
4.0000 mg | Freq: Once | INTRAVENOUS | Status: AC
  Administered 2015-06-03: 4 mg via INTRAVENOUS
  Filled 2015-06-03: qty 1

## 2015-06-03 MED ORDER — ONDANSETRON HCL 4 MG/2ML IJ SOLN
4.0000 mg | Freq: Once | INTRAMUSCULAR | Status: AC
  Administered 2015-06-03: 4 mg via INTRAVENOUS
  Filled 2015-06-03: qty 2

## 2015-06-03 MED ORDER — HYDROMORPHONE HCL 1 MG/ML IJ SOLN
0.5000 mg | Freq: Once | INTRAMUSCULAR | Status: AC
  Administered 2015-06-03: 0.5 mg via INTRAVENOUS
  Filled 2015-06-03: qty 1

## 2015-06-03 MED ORDER — ACETAMINOPHEN 325 MG PO TABS
650.0000 mg | ORAL_TABLET | Freq: Four times a day (QID) | ORAL | Status: DC | PRN
  Administered 2015-06-04 – 2015-06-07 (×5): 650 mg via ORAL
  Filled 2015-06-03 (×6): qty 2

## 2015-06-03 MED ORDER — SODIUM CHLORIDE 0.9 % IV SOLN
INTRAVENOUS | Status: DC
Start: 2015-06-03 — End: 2015-06-06
  Administered 2015-06-03 – 2015-06-04 (×2): via INTRAVENOUS

## 2015-06-03 MED ORDER — ONDANSETRON HCL 4 MG/2ML IJ SOLN
4.0000 mg | Freq: Four times a day (QID) | INTRAMUSCULAR | Status: DC | PRN
  Administered 2015-06-07: 4 mg via INTRAVENOUS
  Filled 2015-06-03: qty 2

## 2015-06-03 MED ORDER — HYDROMORPHONE HCL 1 MG/ML IJ SOLN
0.5000 mg | INTRAMUSCULAR | Status: DC | PRN
  Administered 2015-06-03 – 2015-06-05 (×13): 1 mg via INTRAVENOUS
  Filled 2015-06-03 (×13): qty 1

## 2015-06-03 MED ORDER — SODIUM CHLORIDE 0.9 % IV SOLN
8.0000 mg/h | INTRAVENOUS | Status: DC
  Administered 2015-06-03 – 2015-06-05 (×5): 8 mg/h via INTRAVENOUS
  Filled 2015-06-03 (×11): qty 80

## 2015-06-03 MED ORDER — ONDANSETRON HCL 4 MG/2ML IJ SOLN: 4.0000 mg | Freq: Once | INTRAMUSCULAR | Status: DC

## 2015-06-03 NOTE — ED Provider Notes (Signed)
CSN: [id number]     Arrival date & time 06/03/15  1917 History   First MD Initiated Contact with Patient 06/03/15 1959     Chief Complaint  Patient presents with  . Abdominal Pain     (Consider location/radiation/quality/duration/timing/severity/associated sxs/prior Treatment) HPI   Blood pressure 102/78, pulse 113, temperature 99 F (37.2 C), temperature source Oral, resp. rate 22, SpO2 100 %.  [name] is a 40 y.o. female with past medical history significant for cocaine abuse, GERD, IBS and chronic abdominal pain, states her last endoscopy was by Dr. Collene Mares in 2011 complaining of acute epigastric abdominal pain severe onset 4 days ago, worsening not alleviated with oxycodone or omeprazole. Patient developed melanotic diarrhea today, this is not large volume stool. No history of GI bleed, no prior transfusions. She denies excessive NSAID use, she has been taking Goody powders intermittently. She denies alcohol use, anticoagulation. She has not seen Dr. Collene Mares recently. Patient notes lightheadedness,  dyspnea on exertion   Past Medical History  Diagnosis Date  . History of cocaine abuse 09/28/2010  . GERD (gastroesophageal reflux disease)   . IBS (irritable bowel syndrome)   . Depression   . Mood swings ([place])   . Hypertension   . Chronic abdominal pain    Past Surgical History  Procedure Laterality Date  . Appendectomy    . Cholecystectomy    . Tubal ligation    . Tonsillectomy     Family History  Problem Relation Age of Onset  . Hypertension Mother   . Heart disease Father 22    MI  . Stroke Father 65  . Diabetes Father   . GER disease Sister   . Cancer Maternal Grandmother     uterine and stomach   Social History  Substance Use Topics  . Smoking status: Current Every Day Smoker -- 0.30 packs/day    Types: Cigarettes  . Smokeless tobacco: None  . Alcohol Use: No     Comment: occ   OB History    Gravida Para Term Preterm AB TAB SAB Ectopic Multiple Living    0 0 0 0 0 0 0 0       Review of Systems  10 systems reviewed and found to be negative, except as noted in the HPI.   Allergies  Hydrocodone and Hydrocodone  Home Medications   Prior to Admission medications   Medication Sig Start Date End Date Taking? Authorizing Provider  calcium carbonate (TUMS - DOSED IN MG ELEMENTAL CALCIUM) 500 MG chewable tablet Chew 2 tablets by mouth 2 (two) times daily as needed for heartburn.   Yes Historical Provider, MD  hydrochlorothiazide (HYDRODIURIL) 25 MG tablet Take 25 mg by mouth every morning.   Yes Historical Provider, MD  naproxen sodium (ANAPROX) 220 MG tablet Take 220 mg by mouth 2 (two) times daily as needed (pain).   Yes Historical Provider, MD  omeprazole (PRILOSEC) 40 MG capsule Take 40 mg by mouth daily.   Yes Historical Provider, MD  sucralfate (CARAFATE) 1 G tablet Take 1 tablet (1 g total) by mouth 4 (four) times daily -  with meals and at bedtime. 08/04/14  Yes Montine Circle, PA-C  cephALEXin (KEFLEX) 500 MG capsule Take 1 capsule (500 mg total) by mouth 2 (two) times daily. Patient not taking: Reported on 06/03/2015 07/24/14   Merryl Hacker, MD  cephALEXin (KEFLEX) 500 MG capsule Take 1 capsule (500 mg total) by mouth 2 (two) times daily. Patient not taking: Reported on  12/21/2014 11/27/14   Elnora Morrison, MD  dicyclomine (BENTYL) 20 MG tablet Take 1 tablet (20 mg total) by mouth every 6 (six) hours as needed for spasms (abdominal cramping). Patient not taking: Reported on 06/03/2015 12/21/14   Francine Graven, DO  hydrOXYzine (ATARAX/VISTARIL) 25 MG tablet Take 1 tablet (25 mg total) by mouth every 8 (eight) hours as needed. Patient not taking: Reported on 06/03/2015 06/24/14   Tori Milks, MD  omeprazole (PRILOSEC) 20 MG capsule Take 1 capsule (20 mg total) by mouth daily. Patient not taking: Reported on 12/21/2014 08/04/14   Montine Circle, PA-C  ondansetron (ZOFRAN ODT) 4 MG disintegrating tablet 4mg  ODT q4 hours prn  nausea/vomit Patient not taking: Reported on 06/03/2015 11/27/14   Elnora Morrison, MD  ondansetron (ZOFRAN ODT) 4 MG disintegrating tablet Take 1 tablet (4 mg total) by mouth every 8 (eight) hours as needed for nausea or vomiting. Patient not taking: Reported on 06/03/2015 12/21/14   Francine Graven, DO  ondansetron (ZOFRAN ODT) 8 MG disintegrating tablet 8mg  ODT q8 hours prn nausea Patient not taking: Reported on 06/03/2015 06/02/15   April Palumbo, MD  permethrin (ELIMITE) 5 % cream Apply to entire body other than face - let sit for 14 hours then wash off, may repeat in 1 week if still having symptoms Patient not taking: Reported on 12/21/2014 07/21/14   Montine Circle, PA-C  promethazine (PHENERGAN) 25 MG suppository Place 1 suppository (25 mg total) rectally every 6 (six) hours as needed for nausea or vomiting. Patient not taking: Reported on 06/03/2015 12/21/14   Francine Graven, DO  sucralfate (CARAFATE) 1 GM/10ML suspension Take 10 mLs (1 g total) by mouth 4 (four) times daily -  with meals and at bedtime. Patient not taking: Reported on 06/03/2015 06/02/15   April Palumbo, MD   BP 122/85 mmHg  Pulse 99  Temp(Src) 99 F (37.2 C) (Oral)  Resp 22  SpO2 100% Physical Exam  Constitutional: She is oriented to person, place, and time. She appears well-developed and well-nourished. No distress.  HENT:  Head: Normocephalic and atraumatic.  Mouth/Throat: Oropharynx is clear and moist.  Eyes: Conjunctivae and EOM are normal. Pupils are equal, round, and reactive to light.  Neck: Normal range of motion.  Cardiovascular: Normal rate, regular rhythm and intact distal pulses.   Pulmonary/Chest: Effort normal and breath sounds normal. No stridor. No respiratory distress. She has no wheezes. She has no rales. She exhibits no tenderness.  Abdominal: Soft. She exhibits no distension and no mass. There is tenderness. There is no rebound and no guarding.  To palpation in the epigastrium with no guarding or  rebound.  Genitourinary:  General rectal exam is chaperoned by technician: No rashes or lesions, grossly melanotic stool.   Musculoskeletal: Normal range of motion.  Neurological: She is alert and oriented to person, place, and time.  Psychiatric: She has a normal mood and affect.  Nursing note and vitals reviewed.   ED Course  Procedures (including critical care time)  CRITICAL CARE Performed by: Monico Blitz   Total critical care time: 35 minutes  Critical care time was exclusive of separately billable procedures and treating other patients.  Critical care was necessary to treat or prevent imminent or life-threatening deterioration.  Critical care was time spent personally by me on the following activities: development of treatment plan with patient and/or surrogate as well as nursing, discussions with consultants, evaluation of patient's response to treatment, examination of patient, obtaining history from patient or surrogate, ordering and performing  treatments and interventions, ordering and review of laboratory studies, ordering and review of radiographic studies, pulse oximetry and re-evaluation of patient's condition.   Labs Review Labs Reviewed  COMPREHENSIVE METABOLIC PANEL - Abnormal; Notable for the following:    Glucose, Bld 130 (*)    BUN 22 (*)    Calcium 8.5 (*)    Albumin 3.3 (*)    AST 14 (*)    ALT 11 (*)    Total Bilirubin 0.1 (*)    All other components within normal limits  CBC - Abnormal; Notable for the following:    WBC 11.2 (*)    RBC 2.37 (*)    Hemoglobin 6.1 (*)    HCT 19.0 (*)    MCH 25.7 (*)    RDW 17.8 (*)    Platelets 562 (*)    All other components within normal limits  URINE RAPID DRUG SCREEN, HOSP PERFORMED - Abnormal; Notable for the following:    Opiates POSITIVE (*)    Cocaine POSITIVE (*)    Tetrahydrocannabinol POSITIVE (*)    Barbiturates POSITIVE (*)    All other components within normal limits  POC OCCULT BLOOD, ED -  Abnormal; Notable for the following:    Fecal Occult Bld POSITIVE (*)    All other components within normal limits  LIPASE, BLOOD  PROTIME-INR  APTT  URINALYSIS, ROUTINE W REFLEX MICROSCOPIC (NOT AT [facility])  HEMOGLOBIN AND HEMATOCRIT, BLOOD  HEMOGLOBIN AND HEMATOCRIT, BLOOD  I-STAT CG4 LACTIC ACID, ED  I-STAT CG4 LACTIC ACID, ED  I-STAT CG4 LACTIC ACID, ED  TYPE AND SCREEN  PREPARE RBC (CROSSMATCH)  ABO/RH  ABO/RH    Imaging Review  I have personally reviewed and evaluated these images and lab results as part of my medical decision-making.   EKG Interpretation None      MDM   Final diagnoses:  Symptomatic anemia  Upper GI bleed    Filed Vitals:   06/03/15 1934 06/03/15 2056  BP: 102/78 122/85  Pulse: 113 99  Temp: 99 F (37.2 C)   TempSrc: Oral   Resp: 22 22  SpO2: 100% 100%    Medications  pantoprazole (PROTONIX) 80 mg in sodium chloride 0.9 % 250 mL (0.32 mg/mL) infusion (not administered)  pantoprazole (PROTONIX) injection 40 mg (not administered)  sodium chloride 0.9 % bolus 1,000 mL (0 mLs Intravenous Stopped 06/03/15 2134)  sodium chloride 0.9 % bolus 1,000 mL (0 mLs Intravenous Stopped 06/03/15 2134)  pantoprazole (PROTONIX) 80 mg in sodium chloride 0.9 % 100 mL IVPB (0 mg Intravenous Stopped 06/03/15 2115)  morphine 4 MG/ML injection 4 mg (4 mg Intravenous Given 06/03/15 2033)  ondansetron (ZOFRAN) injection 4 mg (4 mg Intravenous Given 06/03/15 2033)  0.9 %  sodium chloride infusion (10 mL/hr Intravenous New Bag/Given 06/03/15 2139)  HYDROmorphone (DILAUDID) injection 0.5 mg (0.5 mg Intravenous Given 06/03/15 2122)    Abagale Malecki Cockerell is 40 y.o. female presenting with Epigastric abdominal pain which she's had for several days, she had melanotic diarrhea onset today. She has a history of IBS and GERD. Patient with grossly melanotic stool on my exam, guaiac positive. Patient's hemoglobin is 6.1 this is a drop of 6 points within 48 hours. Hemodynamically stable,  in case discussed with gastroenterologist Dr. Fuller Plan who will see her in the a.m. Agrees with Protonix bolus and drip. Will transfuse 2 units.  Case discussed with triad hospitalist Dr. Arnoldo Morale who will admit her to a step down bed.  This is a shared visit  with the attending physician who personally evaluated the patient and agrees with the care plan.       Monico Blitz, PA-C 06/03/15 2326  Wandra Arthurs, MD 06/04/15 [phone]

## 2015-06-03 NOTE — H&P (Addendum)
[facility] Admission History and Physical       Gail TONKINSON [id number] DOB: 09-22-1975 DOA: 06/03/2015  Referring physician: EDP PCP: None Specialists:   Chief Complaint: Nausea and Vomiting  HPI: [name] is a 40 y.o. female with a history of HTN, IBS, Chronic  ABD Pain and history of Polysubstance Abuse who presents to the ED with complaints of Nausea and Vomiting, Hematemesis, and Melena x  1 week.  She reports having increased weakness and lightheadedness, and feeling as if she would faint over there past 2 days.   She has not been able to hold down foods or liquids during this time.   She reports that she took [place] powders for pain off and on but not often.  In the ED, she was found to have an initial  hemoglobin of 6.1 and a Heme+ FOBT, her hemoglobin 2 days ago was 9.7.  Orders were given for transfusion, and an IV Protonix drip was ordered, and GI was consulted and is to see in the AM, The EDP spoke with GI Dr. Fuller Plan.   She was referred for admission for SDU monitoring.      Review of Systems:    Constitutional: No Weight Loss, No Weight Gain, Night Sweats, Fevers, Chills, Dizziness, +Light Headedness, Fatigue, or Generalized Weakness HEENT: No Headaches, Difficulty Swallowing,Tooth/Dental Problems,Sore Throat,  No Sneezing, Rhinitis, Ear Ache, Nasal Congestion, or Post Nasal Drip,  Cardio-vascular:  No Chest pain, Orthopnea, PND, Edema in Lower Extremities, Anasarca, Dizziness, Palpitations  Resp: No Dyspnea, No DOE, No Productive Cough, No Non-Productive Cough, No Hemoptysis, No Wheezing.    GI: No Heartburn, Indigestion, Abdominal Pain, +Nausea, +Vomiting, Diarrhea, Constipation, Hematemesis, Hematochezia, +Melena, Change in Bowel Habits,  Loss of Appetite  GU: No Dysuria, No Change in Color of Urine, No Urgency or Urinary Frequency, No Flank pain.  Musculoskeletal: No Joint Pain or Swelling, No Decreased Range of Motion, No Back Pain.  Neurologic: No  Syncope, No Seizures, Muscle Weakness, Paresthesia, Vision Disturbance or Loss, No Diplopia, No Vertigo, No Difficulty Walking,  Skin: No Rash or Lesions. Psych: No Change in Mood or Affect, No Depression or Anxiety, No Memory loss, No Confusion, or Hallucinations   Past Medical History  Diagnosis Date  . History of cocaine abuse 09/28/2010  . GERD (gastroesophageal reflux disease)   . IBS (irritable bowel syndrome)   . Depression   . Mood swings ([place])   . Hypertension   . Chronic abdominal pain      Past Surgical History  Procedure Laterality Date  . Appendectomy    . Cholecystectomy    . Tubal ligation    . Tonsillectomy        Prior to Admission medications   Medication Sig Start Date End Date Taking? Authorizing Provider  calcium carbonate (TUMS - DOSED IN MG ELEMENTAL CALCIUM) 500 MG chewable tablet Chew 2 tablets by mouth 2 (two) times daily as needed for heartburn.   Yes Historical Provider, MD  hydrochlorothiazide (HYDRODIURIL) 25 MG tablet Take 25 mg by mouth every morning.   Yes Historical Provider, MD  naproxen sodium (ANAPROX) 220 MG tablet Take 220 mg by mouth 2 (two) times daily as needed (pain).   Yes Historical Provider, MD  omeprazole (PRILOSEC) 40 MG capsule Take 40 mg by mouth daily.   Yes Historical Provider, MD  sucralfate (CARAFATE) 1 G tablet Take 1 tablet (1 g total) by mouth 4 (four) times daily -  with meals and at bedtime. 08/04/14  Yes Montine Circle, PA-C  cephALEXin (KEFLEX) 500 MG capsule Take 1 capsule (500 mg total) by mouth 2 (two) times daily. Patient not taking: Reported on 06/03/2015 07/24/14   Merryl Hacker, MD  cephALEXin (KEFLEX) 500 MG capsule Take 1 capsule (500 mg total) by mouth 2 (two) times daily. Patient not taking: Reported on 12/21/2014 11/27/14   Elnora Morrison, MD  dicyclomine (BENTYL) 20 MG tablet Take 1 tablet (20 mg total) by mouth every 6 (six) hours as needed for spasms (abdominal cramping). Patient not taking: Reported on  06/03/2015 12/21/14   Francine Graven, DO  hydrOXYzine (ATARAX/VISTARIL) 25 MG tablet Take 1 tablet (25 mg total) by mouth every 8 (eight) hours as needed. Patient not taking: Reported on 06/03/2015 06/24/14   Tori Milks, MD  omeprazole (PRILOSEC) 20 MG capsule Take 1 capsule (20 mg total) by mouth daily. Patient not taking: Reported on 12/21/2014 08/04/14   Montine Circle, PA-C  ondansetron (ZOFRAN ODT) 4 MG disintegrating tablet 4mg  ODT q4 hours prn nausea/vomit Patient not taking: Reported on 06/03/2015 11/27/14   Elnora Morrison, MD  ondansetron (ZOFRAN ODT) 4 MG disintegrating tablet Take 1 tablet (4 mg total) by mouth every 8 (eight) hours as needed for nausea or vomiting. Patient not taking: Reported on 06/03/2015 12/21/14   Francine Graven, DO  ondansetron (ZOFRAN ODT) 8 MG disintegrating tablet 8mg  ODT q8 hours prn nausea Patient not taking: Reported on 06/03/2015 06/02/15   April Palumbo, MD  permethrin (ELIMITE) 5 % cream Apply to entire body other than face - let sit for 14 hours then wash off, may repeat in 1 week if still having symptoms Patient not taking: Reported on 12/21/2014 07/21/14   Montine Circle, PA-C  promethazine (PHENERGAN) 25 MG suppository Place 1 suppository (25 mg total) rectally every 6 (six) hours as needed for nausea or vomiting. Patient not taking: Reported on 06/03/2015 12/21/14   Francine Graven, DO  sucralfate (CARAFATE) 1 GM/10ML suspension Take 10 mLs (1 g total) by mouth 4 (four) times daily -  with meals and at bedtime. Patient not taking: Reported on 06/03/2015 06/02/15   April Palumbo, MD     Allergies  Allergen Reactions  . Hydrocodone Nausea Only  . Hydrocodone Nausea And Vomiting    Social History:  reports that she has been smoking Cigarettes.  She has been smoking about 0.30 packs per day. She does not have any smokeless tobacco history on file. She reports that she does not drink alcohol or use illicit drugs.    Family History  Problem Relation  Age of Onset  . Hypertension Mother   . Heart disease Father 81    MI  . Stroke Father 22  . Diabetes Father   . GER disease Sister   . Cancer Maternal Grandmother     uterine and stomach       Physical Exam:  GEN:  Pleasant Thin  40 y.o. African American female examined and in discomfort but no acute distress; cooperative with exam Filed Vitals:   06/03/15 1934 06/03/15 2056  BP: 102/78 122/85  Pulse: 113 99  Temp: 99 F (37.2 C)   TempSrc: Oral   Resp: 22 22  SpO2: 100% 100%   Blood pressure 122/85, pulse 99, temperature 99 F (37.2 C), temperature source Oral, resp. rate 22, SpO2 100 %. PSYCH: She is alert and oriented x4; does not appear anxious does not appear depressed; affect is normal HEENT: Normocephalic and Atraumatic, Mucous membranes pink; PERRLA; EOM intact; Fundi:  Benign;  No scleral icterus, Nares: Patent, Oropharynx: Clear,  Fair Dentition,    Neck:  FROM, No Cervical Lymphadenopathy nor Thyromegaly or Carotid Bruit; No JVD; Breasts:: Not examined CHEST WALL: No tenderness CHEST: Normal respiration, clear to auscultation bilaterally HEART: Regular rate and rhythm; no murmurs rubs or gallops BACK: No kyphosis or scoliosis; No CVA tenderness ABDOMEN: Positive Bowel Sounds, Soft mild diffuse Tenderness, No Rebound or Guarding; No Masses, No Organomegaly Rectal Exam: Not done EXTREMITIES: No  Cyanosis, Clubbing, or Edema; No Ulcerations. Genitalia: not examined PULSES: 2+ and symmetric SKIN: Normal hydration no rash or ulceration CNS:  Alert and Oriented x 4, No Focal Deficits Vascular: pulses palpable throughout    Labs on Admission:  Basic Metabolic Panel:  Recent Labs Lab 06/01/15 2204 06/03/15 2020  NA 135 137  K 3.4* 3.7  CL 97* 105  CO2  --  24  GLUCOSE 89 130*  BUN 8 22*  CREATININE 0.60 0.59  CALCIUM  --  8.5*   Liver Function Tests:  Recent Labs Lab 06/03/15 2020  AST 14*  ALT 11*  ALKPHOS 48  BILITOT 0.1*  PROT 6.5  ALBUMIN  3.3*    Recent Labs Lab 06/03/15 2020  LIPASE 30   No results for input(s): AMMONIA in the last 168 hours. CBC:  Recent Labs Lab 06/01/15 2151 06/01/15 2204 06/03/15 2020  WBC 13.5*  --  11.2*  HGB 9.7* 11.9* 6.1*  HCT 29.9* 35.0* 19.0*  MCV 81.5  --  80.2  PLT 794*  --  562*   Cardiac Enzymes: No results for input(s): CKTOTAL, CKMB, CKMBINDEX, TROPONINI in the last 168 hours.  BNP (last 3 results) No results for input(s): BNP in the last 8760 hours.  ProBNP (last 3 results) No results for input(s): PROBNP in the last 8760 hours.  CBG: No results for input(s): GLUCAP in the last 168 hours.  Radiological Exams on Admission: Ct Abdomen Pelvis W Contrast  06/02/2015  CLINICAL DATA:  Constant ongoing chest pain for 2 days. Nausea and vomiting. Hematemesis and black stools. EXAM: CT ABDOMEN AND PELVIS WITH CONTRAST TECHNIQUE: Multidetector CT imaging of the abdomen and pelvis was performed using the standard protocol following bolus administration of intravenous contrast. CONTRAST:  96mL OMNIPAQUE IOHEXOL 300 MG/ML SOLN, 118mL ISOVUE-300 IOPAMIDOL (ISOVUE-300) INJECTION 61% COMPARISON:  11/27/2014 FINDINGS: Lung bases are clear. Surgical absence of the gallbladder. No bile duct dilatation. Mild fatty infiltration of the liver. The pancreas, spleen, adrenal glands, kidneys, abdominal aorta, inferior vena cava, and retroperitoneal lymph nodes are unremarkable. Stomach is filled with contrast material. No gastric wall thickening. Small bowel and colon are not abnormally distended. Diffusely stool-filled colon. No free air or free fluid in the abdomen. Pelvis: Uterus and ovaries are not enlarged. Bladder is decompressed. Loculated fluid collection in the low pelvis adjacent to the bladder outlet, measuring 2.9 cm diameter. This was present previously without interval change. This could represent a urethral diverticulum or possibly a cystic structure such as Skene duct cyst. Small amount of  free fluid in the pelvis may be physiologic. No destructive bone lesions. IMPRESSION: No acute process demonstrated in the abdomen or pelvis. Diffuse fatty infiltration of the liver. Cystic structure at the base of the urethra possibly representing urethral diverticulum or Skene duct cyst. No change since prior study. Electronically Signed   By: Lucienne Capers M.D.   On: 06/02/2015 00:58     Assessment/Plan:   40 y.o. female with  Principal Problem:  GI bleed   SDU monitoring   IV Protonix Drip   Transfuse RBCs   Monitor H/Hs   GI Consulted to see in AM   Active Problems:    Symptomatic anemia- with Microcytic Indices   Transfuse 2 units PRBCs   Monitor H/Hs      Polysubstance abuse/Cocaine abuse and THC Use, +UDS on 03/23   Counseled         Thrombocytosis (HCC)   Monitor    Hypokalemia- Mild,  K+= 3.4, due to HCTZ Rx   Replace IV   Check Magnesium Level       Hypertension   On HCTZ Rx    Holding HCTZ for now   Monitor BPs      Chronic abdominal pain   PRN IV Dilaudid while Inpt    Tobacco abuse   Nicotine Patch daily    DVT Prophylaxis   SCDs    Code Status:     FULL CODE      Family Communication:   Mother at Bedside     Disposition Plan:    Inpatient Status        Time spent: [place] [facility] Pager [phone]   If Impact Please Contact the Day Rounding Team MD for [facility]  If 7PM-7AM, Please Contact Night-Floor Coverage  [email or web address] Password [facility] 06/03/2015, 10:05 PM     ADDENDUM:   Patient was seen and examined on 06/03/2015

## 2015-06-03 NOTE — ED Notes (Signed)
Pt here with complaints of abdominal pain, pt was seen on 3/23 and diagnosed with a UTI and gastritis. Pt states she has thrown up one time today. Pt was given 200 ml of of NS by EMS in route. Pt is on 2L of oxygen for comfort; pt had an oxygen of 100% on RA

## 2015-06-03 NOTE — ED Notes (Signed)
Nurse to draw new lab orders with start of 2nd IV

## 2015-06-04 ENCOUNTER — Encounter (HOSPITAL_COMMUNITY): Payer: Self-pay | Admitting: Gastroenterology

## 2015-06-04 ENCOUNTER — Encounter (HOSPITAL_COMMUNITY)
Admission: EM | Disposition: A | Discharge status: Home / Self Care | Payer: Self-pay | Source: Home / Self Care | Attending: Internal Medicine

## 2015-06-04 DIAGNOSIS — K921 Melena: Secondary | ICD-10-CM

## 2015-06-04 DIAGNOSIS — K264 Chronic or unspecified duodenal ulcer with hemorrhage: Principal | ICD-10-CM

## 2015-06-04 DIAGNOSIS — G8929 Other chronic pain: Secondary | ICD-10-CM

## 2015-06-04 DIAGNOSIS — R109 Unspecified abdominal pain: Secondary | ICD-10-CM

## 2015-06-04 DIAGNOSIS — D62 Acute posthemorrhagic anemia: Secondary | ICD-10-CM

## 2015-06-04 HISTORY — PX: ESOPHAGOGASTRODUODENOSCOPY: SHX5428

## 2015-06-04 LAB — CBC
HEMATOCRIT: 23.5 % — AB (ref 36.0–46.0)
HEMATOCRIT: 23.7 % — AB (ref 36.0–46.0)
HEMATOCRIT: 19.6 % — AB (ref 36.0–46.0)
HEMOGLOBIN: 6.7 g/dL — AB (ref 12.0–15.0)
Hemoglobin: 7.7 g/dL — ABNORMAL LOW (ref 12.0–15.0)
Hemoglobin: 7.8 g/dL — ABNORMAL LOW (ref 12.0–15.0)
MCH: 27 pg (ref 26.0–34.0)
MCH: 27.6 pg (ref 26.0–34.0)
MCH: 28.6 pg (ref 26.0–34.0)
MCHC: 32.8 g/dL (ref 30.0–36.0)
MCHC: 32.9 g/dL (ref 30.0–36.0)
MCHC: 34.2 g/dL (ref 30.0–36.0)
MCV: 82.5 fL (ref 78.0–100.0)
MCV: 83.7 fL (ref 78.0–100.0)
MCV: 83.8 fL (ref 78.0–100.0)
Platelets: 405 10*3/uL — ABNORMAL HIGH (ref 150–400)
Platelets: 398 10*3/uL (ref 150–400)
Platelets: 345 10*3/uL (ref 150–400)
RBC: 2.85 MIL/uL — ABNORMAL LOW (ref 3.87–5.11)
RBC: 2.83 MIL/uL — AB (ref 3.87–5.11)
RBC: 2.34 MIL/uL — ABNORMAL LOW (ref 3.87–5.11)
RDW: 15.9 % — AB (ref 11.5–15.5)
RDW: 16.3 % — ABNORMAL HIGH (ref 11.5–15.5)
RDW: 16.5 % — ABNORMAL HIGH (ref 11.5–15.5)
WBC: 11.9 10*3/uL — ABNORMAL HIGH (ref 4.0–10.5)
WBC: 12.2 10*3/uL — AB (ref 4.0–10.5)
WBC: 9 10*3/uL (ref 4.0–10.5)

## 2015-06-04 LAB — BASIC METABOLIC PANEL
Anion gap: 7 (ref 5–15)
BUN: 15 mg/dL (ref 6–20)
CHLORIDE: 108 mmol/L (ref 101–111)
CO2: 24 mmol/L (ref 22–32)
CREATININE: 0.39 mg/dL — AB (ref 0.44–1.00)
Calcium: 8.1 mg/dL — ABNORMAL LOW (ref 8.9–10.3)
GFR calc Af Amer: >60 mL/min (ref >60)
GFR calc non Af Amer: >60 mL/min (ref >60)
GLUCOSE: 104 mg/dL — AB (ref 65–99)
POTASSIUM: 3.5 mmol/L (ref 3.5–5.1)
Sodium: 139 mmol/L (ref 135–145)

## 2015-06-04 LAB — MRSA PCR SCREENING: MRSA by PCR: POSITIVE — AB

## 2015-06-04 LAB — PREPARE RBC (CROSSMATCH)

## 2015-06-04 SURGERY — EGD (ESOPHAGOGASTRODUODENOSCOPY)
Anesthesia: Moderate Sedation

## 2015-06-04 MED ORDER — EPINEPHRINE HCL 1 MG/ML IJ SOLN
INTRAMUSCULAR | Status: DC | PRN
  Administered 2015-06-04: 2.5 mg

## 2015-06-04 MED ORDER — DEXTROSE 5 % IV SOLN
1.0000 g | INTRAVENOUS | Status: DC
  Administered 2015-06-04 – 2015-06-06 (×3): 1 g via INTRAVENOUS
  Filled 2015-06-04 (×4): qty 10

## 2015-06-04 MED ORDER — EPINEPHRINE HCL 0.1 MG/ML IJ SOSY
PREFILLED_SYRINGE | INTRAMUSCULAR | Status: AC
  Filled 2015-06-04: qty 10

## 2015-06-04 MED ORDER — BUTAMBEN-TETRACAINE-BENZOCAINE 2-2-14 % EX AERO
INHALATION_SPRAY | CUTANEOUS | Status: DC | PRN
  Administered 2015-06-04: 1 via TOPICAL

## 2015-06-04 MED ORDER — DIPHENHYDRAMINE HCL 50 MG/ML IJ SOLN
INTRAMUSCULAR | Status: DC | PRN
  Administered 2015-06-04: 25 mg via INTRAVENOUS

## 2015-06-04 MED ORDER — DIPHENHYDRAMINE HCL 50 MG PO CAPS
50.0000 mg | ORAL_CAPSULE | Freq: Once | ORAL | Status: AC
  Administered 2015-06-04: 50 mg via ORAL
  Filled 2015-06-04: qty 1

## 2015-06-04 MED ORDER — MUPIROCIN 2 % EX OINT
1.0000 "application " | TOPICAL_OINTMENT | Freq: Two times a day (BID) | CUTANEOUS | Status: DC
  Administered 2015-06-04 – 2015-06-07 (×6): 1 via NASAL
  Filled 2015-06-04 (×2): qty 22

## 2015-06-04 MED ORDER — DIPHENHYDRAMINE HCL 25 MG PO CAPS
25.0000 mg | ORAL_CAPSULE | ORAL | Status: DC | PRN
  Administered 2015-06-05: 25 mg via ORAL
  Filled 2015-06-04: qty 1

## 2015-06-04 MED ORDER — BOOST / RESOURCE BREEZE PO LIQD
1.0000 | Freq: Three times a day (TID) | ORAL | Status: DC
  Administered 2015-06-04 – 2015-06-05 (×2): 1 via ORAL

## 2015-06-04 MED ORDER — FENTANYL CITRATE (PF) 100 MCG/2ML IJ SOLN
INTRAMUSCULAR | Status: DC | PRN
  Administered 2015-06-04 (×2): 25 ug via INTRAVENOUS

## 2015-06-04 MED ORDER — MIDAZOLAM HCL 10 MG/2ML IJ SOLN
INTRAMUSCULAR | Status: DC | PRN
  Administered 2015-06-04: 2 mg via INTRAVENOUS
  Administered 2015-06-04 (×3): 1 mg via INTRAVENOUS

## 2015-06-04 MED ORDER — SODIUM CHLORIDE 0.9 % IV SOLN
Freq: Once | INTRAVENOUS | Status: AC
  Administered 2015-06-04: 23:00:00 via INTRAVENOUS

## 2015-06-04 MED ORDER — CHLORHEXIDINE GLUCONATE CLOTH 2 % EX PADS
6.0000 | MEDICATED_PAD | Freq: Every day | CUTANEOUS | Status: DC
  Administered 2015-06-05 – 2015-06-07 (×3): 6 via TOPICAL

## 2015-06-04 NOTE — ED Notes (Addendum)
Endoscopy performed  in room.

## 2015-06-04 NOTE — Progress Notes (Signed)
PROGRESS NOTE    [name]  [id number]  DOB: [birth year]  DOA: 06/03/2015 PCP: Andrena Mews, MD Outpatient Specialists:   Hospital course: 40 year old female with history of polysubstance abuse-tobacco, THC & cocaine, esophagitis by EGD 2011 by Dr. Collene Mares, GERD, IBS, depression, HTN, chronic abdominal pain, presented to ED with complaints of one-week history of hematemesis, 4 days history of melena, upper abdominal pain, progressive weakness, lightheadedness and feeling like she was going to pass out. Unable to keep oral intake down. Intermittent use of NSAIDs and [place] powder. History of heavy menstrual bleeding (LMP 05/15/15). Baseline hemoglobin probably in the 10 g per DL range. Presented with hemoglobin 6.1. Admitting to stepdown unit for UGIB & ABLA. Hemoglobin improved to 7.7 status post 2 units PRBCs. [facility] consulted and plan EGD 3/26.   Assessment & Plan:   Subacute upper GI bleed - DD: PUD, NSAID-induced gastritis, esophagitis, MW tear, neoplasm versus other etiologies. - Continue NPO, IVF, IV PPI infusion  - [facility] consulted/discussed with and plan EGD this morning. Await further recommendations post EGD.  Acute blood loss anemia - Secondary to upper GI bleed and chronic heavy menstrual bleeding. - Baseline hemoglobin approximately 10 g per DL. Presented with hemoglobin of 6.1. Hemoglobin improved to 7.7 status post 2 units PRBC night of admission. - Follow CBCs closely and transfuse if hemoglobin <7 g per DL. - Recommended outpatient GYN consultation to address heavy menstrual bleeding.  Polysubstance abuse-tobacco, THC & cocaine - Cessation counseled. Patient declines nicotine patch.  Significant weight loss - Patient reports significant weight loss over the last 6 months. CT abdomen and pelvis 06/02/15 unremarkable. - May be related to poor oral intake from upper GI issues. Check HIV.  Chronic abdominal pain - Recent CT abdomen unremarkable. -  Lipase normal. Urine pregnancy test 06/01/15: Negative.  Essential hypertension - Mildly uncontrolled. Holding HCTZ for now. Monitor off of medications given current GI bleed.    DVT prophylaxis: SCDs Code Status: Full Family Communication: Discussed with patient's mother at bedside in the ED. Disposition Plan: Admitting to stepdown unit. DC home when medically stable.   Consultants:  [facility]  Procedures:  None  Antimicrobials:  None   Subjective: Feels better after 2 units of blood transfusion. No further BM or emesis since arrival to ED. Upper abdominal pain better. No dizziness, lightheadedness, chest pain or dyspnea.  Objective: Filed Vitals:   06/04/15 1030 06/04/15 1035 06/04/15 1040 06/04/15 1050  BP: 135/112 126/105 149/97 144/95  Pulse: 105 93 82 75  Temp:      TempSrc:      Resp:      SpO2: 100% 100% 100% 100%  Respiratory rate 17 per minute and temperature 85F.  Intake/Output Summary (Last 24 hours) at 06/04/15 1105 Last data filed at 06/04/15 0319  Gross per 24 hour  Intake    564 ml  Output      0 ml  Net    564 ml   There were no vitals filed for this visit.  Exam:  General exam: Small built and thinly nourished pleasant young female sitting up comfortably on the gurney in the ED. Respiratory system: Clear. No increased work of breathing. Cardiovascular system: S1 & S2 heard, RRR. No JVD, murmurs, gallops, clicks or pedal edema. Telemetry: Sinus rhythm. Gastrointestinal system: Abdomen is nondistended, soft. Mild epigastric/periumbilical tenderness without peritoneal signs. Normal bowel sounds heard. Central nervous system: Alert and oriented. No focal neurological deficits. Extremities: Symmetric 5 x 5 power.  Data Reviewed: Basic Metabolic Panel:  Recent Labs Lab 06/01/15 2204 06/03/15 2020 06/04/15 0515  NA 135 137 139  K 3.4* 3.7 3.5  CL 97* 105 108  CO2  --  24 24  GLUCOSE 89 130* 104*  BUN 8 22* 15  CREATININE 0.60 0.59  0.39*  CALCIUM  --  8.5* 8.1*   Liver Function Tests:  Recent Labs Lab 06/03/15 2020  AST 14*  ALT 11*  ALKPHOS 48  BILITOT 0.1*  PROT 6.5  ALBUMIN 3.3*    Recent Labs Lab 06/03/15 2020  LIPASE 30   No results for input(s): AMMONIA in the last 168 hours. CBC:  Recent Labs Lab 06/01/15 2151 06/01/15 2204 06/03/15 2020 06/04/15 0515  WBC 13.5*  --  11.2* 11.9*  HGB 9.7* 11.9* 6.1* 7.7*  HCT 29.9* 35.0* 19.0* 23.5*  MCV 81.5  --  80.2 82.5  PLT 794*  --  562* 405*   Cardiac Enzymes: No results for input(s): CKTOTAL, CKMB, CKMBINDEX, TROPONINI in the last 168 hours. BNP (last 3 results) No results for input(s): PROBNP in the last 8760 hours. CBG: No results for input(s): GLUCAP in the last 168 hours.  No results found for this or any previous visit (from the past 240 hour(s)).       Studies: No results found.      Scheduled Meds: . [START ON 06/07/2015] pantoprazole (PROTONIX) IV  40 mg Intravenous Q12H   Continuous Infusions: . sodium chloride 100 mL/hr at 06/03/15 2340  . pantoprozole (PROTONIX) infusion 8 mg/hr (06/03/15 2340)    Principal Problem:   GI bleed Active Problems:   Hypertension   Tobacco abuse   Symptomatic anemia   Polysubstance abuse   Thrombocytosis (HCC)   Chronic abdominal pain   Cocaine abuse    Time spent: 45 minutes.    Vernell Leep, MD, FACP, FHM. [facility] Pager [phone]  If 7PM-7AM, please contact night-coverage [email or web address] Password [email or web address] 06/04/2015, 11:05 AM    LOS: 1 day

## 2015-06-04 NOTE — ED Notes (Signed)
Pt refused blood draw

## 2015-06-04 NOTE — ED Notes (Addendum)
Stuck PT in R AC, PT request for blood to be taking out of IV stated she not suppose to have more blood taking until 1400. Nurse at bedside speaking with PT

## 2015-06-04 NOTE — H&P (View-Only) (Signed)
Consultation  Referring Provider: [facility] Primary Care Physician:  Andrena Mews, MD Primary Gastroenterologist:   Marlynn Perking Althia Forts  Reason for Consultation:  GI bleed  HPI: [name] is a 40 y.o. female chronic polysubstance abuser who presented to the emergency room last night with complaints of epigastric pain, nausea, vomiting and black stools over the past 2-3 days. She says she also vomited up some dark black material. Hemoglobin was found to be 6.1 down from a hemoglobin of 11.9 2-3 days ago when she was in the emergency room with complaints of chest pain. She has been given prescriptions for Prilosec and Carafate for what was felt to be esophagitis. She admits to taking Goody powders and occasional Aleve but nothing on a daily basis. She is also had a significant weight loss over the past 6 months according to patient and patient's family member. CT of the abdomen and pelvis done with contrast on 06/02/2015 was unremarkable.  Patient had been seen by Dr. Meriel Pica in 2011 while hospitalized and had EGD at that time with finding of esophagitis Patient had drug screen this admission positive for opiates, cocaine, THC and barbiturates She's been transfused 2 units and hemoglobin is 7.7 this morning Patient has been hemodynamically stable.   Past Medical History  Diagnosis Date  . History of cocaine abuse 09/28/2010  . GERD (gastroesophageal reflux disease)   . IBS (irritable bowel syndrome)   . Depression   . Mood swings ([place])   . Hypertension   . Chronic abdominal pain     Past Surgical History  Procedure Laterality Date  . Appendectomy    . Cholecystectomy    . Tubal ligation    . Tonsillectomy      Prior to Admission medications   Medication Sig Start Date End Date Taking? Authorizing Provider  Aspirin-Acetaminophen (GOODYS BODY PAIN PO) Take 1 packet by mouth daily as needed (for pain).   Yes Historical Provider, MD  calcium carbonate  (TUMS - DOSED IN MG ELEMENTAL CALCIUM) 500 MG chewable tablet Chew 2 tablets by mouth 2 (two) times daily as needed for heartburn.   Yes Historical Provider, MD  hydrochlorothiazide (HYDRODIURIL) 25 MG tablet Take 25 mg by mouth every morning.   Yes Historical Provider, MD  naproxen sodium (ANAPROX) 220 MG tablet Take 220 mg by mouth 2 (two) times daily as needed (pain).   Yes Historical Provider, MD  omeprazole (PRILOSEC) 40 MG capsule Take 40 mg by mouth daily.   Yes Historical Provider, MD  sucralfate (CARAFATE) 1 G tablet Take 1 tablet (1 g total) by mouth 4 (four) times daily -  with meals and at bedtime. 08/04/14  Yes Montine Circle, PA-C  cephALEXin (KEFLEX) 500 MG capsule Take 1 capsule (500 mg total) by mouth 2 (two) times daily. Patient not taking: Reported on 06/03/2015 07/24/14   Merryl Hacker, MD  cephALEXin (KEFLEX) 500 MG capsule Take 1 capsule (500 mg total) by mouth 2 (two) times daily. Patient not taking: Reported on 12/21/2014 11/27/14   Elnora Morrison, MD  dicyclomine (BENTYL) 20 MG tablet Take 1 tablet (20 mg total) by mouth every 6 (six) hours as needed for spasms (abdominal cramping). Patient not taking: Reported on 06/03/2015 12/21/14   Francine Graven, DO  hydrOXYzine (ATARAX/VISTARIL) 25 MG tablet Take 1 tablet (25 mg total) by mouth every 8 (eight) hours as needed. Patient not taking: Reported on 06/03/2015 06/24/14   Tori Milks, MD  omeprazole (PRILOSEC) 20 MG capsule Take  1 capsule (20 mg total) by mouth daily. Patient not taking: Reported on 12/21/2014 08/04/14   Montine Circle, PA-C  ondansetron (ZOFRAN ODT) 4 MG disintegrating tablet 4mg  ODT q4 hours prn nausea/vomit Patient not taking: Reported on 06/03/2015 11/27/14   Elnora Morrison, MD  ondansetron (ZOFRAN ODT) 4 MG disintegrating tablet Take 1 tablet (4 mg total) by mouth every 8 (eight) hours as needed for nausea or vomiting. Patient not taking: Reported on 06/03/2015 12/21/14   Francine Graven, DO  ondansetron  (ZOFRAN ODT) 8 MG disintegrating tablet 8mg  ODT q8 hours prn nausea Patient not taking: Reported on 06/03/2015 06/02/15   April Palumbo, MD  permethrin (ELIMITE) 5 % cream Apply to entire body other than face - let sit for 14 hours then wash off, may repeat in 1 week if still having symptoms Patient not taking: Reported on 12/21/2014 07/21/14   Montine Circle, PA-C  promethazine (PHENERGAN) 25 MG suppository Place 1 suppository (25 mg total) rectally every 6 (six) hours as needed for nausea or vomiting. Patient not taking: Reported on 06/03/2015 12/21/14   Francine Graven, DO  sucralfate (CARAFATE) 1 GM/10ML suspension Take 10 mLs (1 g total) by mouth 4 (four) times daily -  with meals and at bedtime. Patient not taking: Reported on 06/04/2015 06/02/15   April Palumbo, MD    Current Facility-Administered Medications  Medication Dose Route Frequency Provider Last Rate Last Dose  . 0.9 %  sodium chloride infusion   Intravenous Continuous Theressa Millard, MD 100 mL/hr at 06/03/15 2340    . acetaminophen (TYLENOL) tablet 650 mg  650 mg Oral Q6H PRN Theressa Millard, MD   650 mg at 06/04/15 0504   Or  . acetaminophen (TYLENOL) suppository 650 mg  650 mg Rectal Q6H PRN Theressa Millard, MD      . HYDROmorphone (DILAUDID) injection 0.5-1 mg  0.5-1 mg Intravenous Q3H PRN Theressa Millard, MD   1 mg at 06/04/15 [id number]  . ondansetron (ZOFRAN) tablet 4 mg  4 mg Oral Q6H PRN Theressa Millard, MD       Or  . ondansetron (ZOFRAN) injection 4 mg  4 mg Intravenous Q6H PRN Theressa Millard, MD      . pantoprazole (PROTONIX) 80 mg in sodium chloride 0.9 % 250 mL (0.32 mg/mL) infusion  8 mg/hr Intravenous Continuous Theressa Millard, MD 25 mL/hr at 06/03/15 2340 8 mg/hr at 06/03/15 2340  . [START ON 06/07/2015] pantoprazole (PROTONIX) injection 40 mg  40 mg Intravenous Q12H Theressa Millard, MD       Current Outpatient Prescriptions  Medication Sig Dispense Refill  . Aspirin-Acetaminophen (GOODYS  BODY PAIN PO) Take 1 packet by mouth daily as needed (for pain).    . calcium carbonate (TUMS - DOSED IN MG ELEMENTAL CALCIUM) 500 MG chewable tablet Chew 2 tablets by mouth 2 (two) times daily as needed for heartburn.    . hydrochlorothiazide (HYDRODIURIL) 25 MG tablet Take 25 mg by mouth every morning.    . naproxen sodium (ANAPROX) 220 MG tablet Take 220 mg by mouth 2 (two) times daily as needed (pain).    Marland Kitchen omeprazole (PRILOSEC) 40 MG capsule Take 40 mg by mouth daily.    . sucralfate (CARAFATE) 1 G tablet Take 1 tablet (1 g total) by mouth 4 (four) times daily -  with meals and at bedtime. 120 tablet 0  . cephALEXin (KEFLEX) 500 MG capsule Take 1 capsule (500 mg total) by mouth 2 (two) times  daily. (Patient not taking: Reported on 06/03/2015) 14 capsule 0  . cephALEXin (KEFLEX) 500 MG capsule Take 1 capsule (500 mg total) by mouth 2 (two) times daily. (Patient not taking: Reported on 12/21/2014) 14 capsule 0  . dicyclomine (BENTYL) 20 MG tablet Take 1 tablet (20 mg total) by mouth every 6 (six) hours as needed for spasms (abdominal cramping). (Patient not taking: Reported on 06/03/2015) 15 tablet 0  . hydrOXYzine (ATARAX/VISTARIL) 25 MG tablet Take 1 tablet (25 mg total) by mouth every 8 (eight) hours as needed. (Patient not taking: Reported on 06/03/2015) 30 tablet 0  . omeprazole (PRILOSEC) 20 MG capsule Take 1 capsule (20 mg total) by mouth daily. (Patient not taking: Reported on 12/21/2014) 30 capsule 0  . ondansetron (ZOFRAN ODT) 4 MG disintegrating tablet 4mg  ODT q4 hours prn nausea/vomit (Patient not taking: Reported on 06/03/2015) 4 tablet 0  . ondansetron (ZOFRAN ODT) 4 MG disintegrating tablet Take 1 tablet (4 mg total) by mouth every 8 (eight) hours as needed for nausea or vomiting. (Patient not taking: Reported on 06/03/2015) 6 tablet 0  . ondansetron (ZOFRAN ODT) 8 MG disintegrating tablet 8mg  ODT q8 hours prn nausea (Patient not taking: Reported on 06/03/2015) 12 tablet 0  . permethrin  (ELIMITE) 5 % cream Apply to entire body other than face - let sit for 14 hours then wash off, may repeat in 1 week if still having symptoms (Patient not taking: Reported on 12/21/2014) 60 g 1  . promethazine (PHENERGAN) 25 MG suppository Place 1 suppository (25 mg total) rectally every 6 (six) hours as needed for nausea or vomiting. (Patient not taking: Reported on 06/03/2015) 6 each 0  . sucralfate (CARAFATE) 1 GM/10ML suspension Take 10 mLs (1 g total) by mouth 4 (four) times daily -  with meals and at bedtime. (Patient not taking: Reported on 06/04/2015) 420 mL 0    Allergies as of 06/03/2015 - Review Complete 06/03/2015  Allergen Reaction Noted  . Hydrocodone Nausea Only 09/28/2010  . Hydrocodone Nausea And Vomiting 11/27/2014    Family History  Problem Relation Age of Onset  . Hypertension Mother   . Heart disease Father 45    MI  . Stroke Father 91  . Diabetes Father   . GER disease Sister   . Cancer Maternal Grandmother     uterine and stomach    Social History   Social History  . Marital Status: Single    Spouse Name: N/A  . Number of Children: N/A  . Years of Education: N/A   Occupational History  . Not on file.   Social History Main Topics  . Smoking status: Current Every Day Smoker -- 0.30 packs/day    Types: Cigarettes  . Smokeless tobacco: Not on file  . Alcohol Use: No     Comment: occ  . Drug Use: No     Comment: abstinent for 1 year  . Sexual Activity: Yes    Birth Control/ Protection: Surgical   Other Topics Concern  . Not on file   Social History Narrative   ** Merged History Encounter **       Lives with a partner.  Never married.  6 children ages 7-20.   Finished 12th grade.  Unemployed.  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    Review of Systems:  Pertinent positive and negative review of systems were noted in the above HPI section.  All other review of systems was otherwise negative.  Physical Exam: Vital signs in last 24  hours: Temp:  [97.8 F (36.6 C)-99 F (37.2 C)] 98 F (36.7 C) (03/26 0822) Pulse Rate:  [61-120] 61 (03/26 0835) Resp:  [11-29] 17 (03/26 0835) BP: (102-122)/(68-92) 117/71 mmHg (03/26 0835) SpO2:  [98 %-100 %] 100 % (03/26 0835)   General:   Alert,  Well-developed, disheveled , AA female  cooperative in NAD Head:  Normocephalic and atraumatic. Eyes:  Sclera clear, no icterus.   Conjunctiva pink. Ears:  Normal auditory acuity. Nose:  No deformity, discharge,  or lesions. Mouth:  No deformity or lesions.   Neck:  Supple; no masses or thyromegaly. Lungs:  Clear throughout to auscultation.   No wheezes, crackles, or rhonchi.  Heart:  Regular rate and rhythm; no murmurs, clicks, rubs,  or gallops. Abdomen:  Soft,tender epigastrium, BS active, nonpalp mass or hsm.   Rectal:  Deferred  Msk:  Symmetrical without gross deformities. . Pulses:  Normal pulses noted. Extremities:  Without clubbing or edema. Neurologic:  Alert and  oriented x4;  grossly normal neurologically. Skin:  Multiple skin lesions/sores .Marland Kitchen Psych:  Alert and cooperative. Normal mood and affect flat.  Intake/Output from previous day: 03/25 0701 - 03/26 0700 In: 564 [Blood:564] Out: -  Intake/Output this shift:    Lab Results:  Recent Labs  06/01/15 2151 06/01/15 2204 06/03/15 2020 06/04/15 0515  WBC 13.5*  --  11.2* 11.9*  HGB 9.7* 11.9* 6.1* 7.7*  HCT 29.9* 35.0* 19.0* 23.5*  PLT 794*  --  562* 405*   BMET  Recent Labs  06/01/15 2204 06/03/15 2020 06/04/15 0515  NA 135 137 139  K 3.4* 3.7 3.5  CL 97* 105 108  CO2  --  24 24  GLUCOSE 89 130* 104*  BUN 8 22* 15  CREATININE 0.60 0.59 0.39*  CALCIUM  --  8.5* 8.1*   LFT  Recent Labs  06/03/15 2020  PROT 6.5  ALBUMIN 3.3*  AST 14*  ALT 11*  ALKPHOS 48  BILITOT 0.1*   PT/INR  Recent Labs  06/03/15 2122  LABPROT 14.5  INR 1.16     IMPRESSION:   #84 40 year old African-American female chronic polysubstance abuse or presenting  with epigastric pain, nausea, vomiting and melena with hemoglobin of 6.1. Rule out peptic ulcer disease, ulcerative esophagitis, occult neoplasm, Mallory-Weiss tear  #2 weight loss-probably secondary to ongoing drug abuse #3 anemia normocytic secondary to #1 #4 chronic abdominal pain  PLAN: Nothing by mouth Have scheduled for upper endoscopy with Dr. Fuller Plan today. Procedure discussed with the patient in detail including risks and benefits and she is agreeable to proceed IV PPI infusion has been started Transfuse to keep hemoglobin above 7 Further plans pending findings at EGD  [facility]  06/04/2015, 8:37 AM     Attending physician's note   I have taken a history, examined the patient and reviewed the chart. I agree with the Advanced Practitioner's note, impression and recommendations. UGI bleed with ABL anemia. R/O erosive esophagitis, ulcer, MW tear, etc. IV PPI infusion for now. Transfuse to keep Hb > 7. EGD today. The risks (including bleeding, perforation, infection, missed lesions, medication reactions and possible hospitalization or surgery if complications occur), benefits, and alternatives to endoscopy with possible biopsy and possible dilation were discussed with the patient and they consent to proceed.    Lucio Edward, MD [facility] [phone] Mon-Fri 8a-5p [phone] after 5p, weekends, holidays

## 2015-06-04 NOTE — Consult Note (Signed)
Consultation  Referring Provider: [facility] Primary Care Physician:  Andrena Mews, MD Primary Gastroenterologist:   Marlynn Perking Althia Forts  Reason for Consultation:  GI bleed  HPI: [name] is a 40 y.o. female chronic polysubstance abuser who presented to the emergency room last night with complaints of epigastric pain, nausea, vomiting and black stools over the past 2-3 days. She says she also vomited up some dark black material. Hemoglobin was found to be 6.1 down from a hemoglobin of 11.9 2-3 days ago when she was in the emergency room with complaints of chest pain. She has been given prescriptions for Prilosec and Carafate for what was felt to be esophagitis. She admits to taking Goody powders and occasional Aleve but nothing on a daily basis. She is also had a significant weight loss over the past 6 months according to patient and patient's family member. CT of the abdomen and pelvis done with contrast on 06/02/2015 was unremarkable.  Patient had been seen by Dr. Meriel Pica in 2011 while hospitalized and had EGD at that time with finding of esophagitis Patient had drug screen this admission positive for opiates, cocaine, THC and barbiturates She's been transfused 2 units and hemoglobin is 7.7 this morning Patient has been hemodynamically stable.   Past Medical History  Diagnosis Date  . History of cocaine abuse 09/28/2010  . GERD (gastroesophageal reflux disease)   . IBS (irritable bowel syndrome)   . Depression   . Mood swings ([place])   . Hypertension   . Chronic abdominal pain     Past Surgical History  Procedure Laterality Date  . Appendectomy    . Cholecystectomy    . Tubal ligation    . Tonsillectomy      Prior to Admission medications   Medication Sig Start Date End Date Taking? Authorizing Provider  Aspirin-Acetaminophen (GOODYS BODY PAIN PO) Take 1 packet by mouth daily as needed (for pain).   Yes Historical Provider, MD  calcium carbonate  (TUMS - DOSED IN MG ELEMENTAL CALCIUM) 500 MG chewable tablet Chew 2 tablets by mouth 2 (two) times daily as needed for heartburn.   Yes Historical Provider, MD  hydrochlorothiazide (HYDRODIURIL) 25 MG tablet Take 25 mg by mouth every morning.   Yes Historical Provider, MD  naproxen sodium (ANAPROX) 220 MG tablet Take 220 mg by mouth 2 (two) times daily as needed (pain).   Yes Historical Provider, MD  omeprazole (PRILOSEC) 40 MG capsule Take 40 mg by mouth daily.   Yes Historical Provider, MD  sucralfate (CARAFATE) 1 G tablet Take 1 tablet (1 g total) by mouth 4 (four) times daily -  with meals and at bedtime. 08/04/14  Yes Montine Circle, PA-C  cephALEXin (KEFLEX) 500 MG capsule Take 1 capsule (500 mg total) by mouth 2 (two) times daily. Patient not taking: Reported on 06/03/2015 07/24/14   Merryl Hacker, MD  cephALEXin (KEFLEX) 500 MG capsule Take 1 capsule (500 mg total) by mouth 2 (two) times daily. Patient not taking: Reported on 12/21/2014 11/27/14   Elnora Morrison, MD  dicyclomine (BENTYL) 20 MG tablet Take 1 tablet (20 mg total) by mouth every 6 (six) hours as needed for spasms (abdominal cramping). Patient not taking: Reported on 06/03/2015 12/21/14   Francine Graven, DO  hydrOXYzine (ATARAX/VISTARIL) 25 MG tablet Take 1 tablet (25 mg total) by mouth every 8 (eight) hours as needed. Patient not taking: Reported on 06/03/2015 06/24/14   Tori Milks, MD  omeprazole (PRILOSEC) 20 MG capsule Take  1 capsule (20 mg total) by mouth daily. Patient not taking: Reported on 12/21/2014 08/04/14   Montine Circle, PA-C  ondansetron (ZOFRAN ODT) 4 MG disintegrating tablet 4mg  ODT q4 hours prn nausea/vomit Patient not taking: Reported on 06/03/2015 11/27/14   Elnora Morrison, MD  ondansetron (ZOFRAN ODT) 4 MG disintegrating tablet Take 1 tablet (4 mg total) by mouth every 8 (eight) hours as needed for nausea or vomiting. Patient not taking: Reported on 06/03/2015 12/21/14   Francine Graven, DO  ondansetron  (ZOFRAN ODT) 8 MG disintegrating tablet 8mg  ODT q8 hours prn nausea Patient not taking: Reported on 06/03/2015 06/02/15   April Palumbo, MD  permethrin (ELIMITE) 5 % cream Apply to entire body other than face - let sit for 14 hours then wash off, may repeat in 1 week if still having symptoms Patient not taking: Reported on 12/21/2014 07/21/14   Montine Circle, PA-C  promethazine (PHENERGAN) 25 MG suppository Place 1 suppository (25 mg total) rectally every 6 (six) hours as needed for nausea or vomiting. Patient not taking: Reported on 06/03/2015 12/21/14   Francine Graven, DO  sucralfate (CARAFATE) 1 GM/10ML suspension Take 10 mLs (1 g total) by mouth 4 (four) times daily -  with meals and at bedtime. Patient not taking: Reported on 06/04/2015 06/02/15   April Palumbo, MD    Current Facility-Administered Medications  Medication Dose Route Frequency Provider Last Rate Last Dose  . 0.9 %  sodium chloride infusion   Intravenous Continuous Theressa Millard, MD 100 mL/hr at 06/03/15 2340    . acetaminophen (TYLENOL) tablet 650 mg  650 mg Oral Q6H PRN Theressa Millard, MD   650 mg at 06/04/15 0504   Or  . acetaminophen (TYLENOL) suppository 650 mg  650 mg Rectal Q6H PRN Theressa Millard, MD      . HYDROmorphone (DILAUDID) injection 0.5-1 mg  0.5-1 mg Intravenous Q3H PRN Theressa Millard, MD   1 mg at 06/04/15 [id number]  . ondansetron (ZOFRAN) tablet 4 mg  4 mg Oral Q6H PRN Theressa Millard, MD       Or  . ondansetron (ZOFRAN) injection 4 mg  4 mg Intravenous Q6H PRN Theressa Millard, MD      . pantoprazole (PROTONIX) 80 mg in sodium chloride 0.9 % 250 mL (0.32 mg/mL) infusion  8 mg/hr Intravenous Continuous Theressa Millard, MD 25 mL/hr at 06/03/15 2340 8 mg/hr at 06/03/15 2340  . [START ON 06/07/2015] pantoprazole (PROTONIX) injection 40 mg  40 mg Intravenous Q12H Theressa Millard, MD       Current Outpatient Prescriptions  Medication Sig Dispense Refill  . Aspirin-Acetaminophen (GOODYS  BODY PAIN PO) Take 1 packet by mouth daily as needed (for pain).    . calcium carbonate (TUMS - DOSED IN MG ELEMENTAL CALCIUM) 500 MG chewable tablet Chew 2 tablets by mouth 2 (two) times daily as needed for heartburn.    . hydrochlorothiazide (HYDRODIURIL) 25 MG tablet Take 25 mg by mouth every morning.    . naproxen sodium (ANAPROX) 220 MG tablet Take 220 mg by mouth 2 (two) times daily as needed (pain).    Marland Kitchen omeprazole (PRILOSEC) 40 MG capsule Take 40 mg by mouth daily.    . sucralfate (CARAFATE) 1 G tablet Take 1 tablet (1 g total) by mouth 4 (four) times daily -  with meals and at bedtime. 120 tablet 0  . cephALEXin (KEFLEX) 500 MG capsule Take 1 capsule (500 mg total) by mouth 2 (two) times  daily. (Patient not taking: Reported on 06/03/2015) 14 capsule 0  . cephALEXin (KEFLEX) 500 MG capsule Take 1 capsule (500 mg total) by mouth 2 (two) times daily. (Patient not taking: Reported on 12/21/2014) 14 capsule 0  . dicyclomine (BENTYL) 20 MG tablet Take 1 tablet (20 mg total) by mouth every 6 (six) hours as needed for spasms (abdominal cramping). (Patient not taking: Reported on 06/03/2015) 15 tablet 0  . hydrOXYzine (ATARAX/VISTARIL) 25 MG tablet Take 1 tablet (25 mg total) by mouth every 8 (eight) hours as needed. (Patient not taking: Reported on 06/03/2015) 30 tablet 0  . omeprazole (PRILOSEC) 20 MG capsule Take 1 capsule (20 mg total) by mouth daily. (Patient not taking: Reported on 12/21/2014) 30 capsule 0  . ondansetron (ZOFRAN ODT) 4 MG disintegrating tablet 4mg  ODT q4 hours prn nausea/vomit (Patient not taking: Reported on 06/03/2015) 4 tablet 0  . ondansetron (ZOFRAN ODT) 4 MG disintegrating tablet Take 1 tablet (4 mg total) by mouth every 8 (eight) hours as needed for nausea or vomiting. (Patient not taking: Reported on 06/03/2015) 6 tablet 0  . ondansetron (ZOFRAN ODT) 8 MG disintegrating tablet 8mg  ODT q8 hours prn nausea (Patient not taking: Reported on 06/03/2015) 12 tablet 0  . permethrin  (ELIMITE) 5 % cream Apply to entire body other than face - let sit for 14 hours then wash off, may repeat in 1 week if still having symptoms (Patient not taking: Reported on 12/21/2014) 60 g 1  . promethazine (PHENERGAN) 25 MG suppository Place 1 suppository (25 mg total) rectally every 6 (six) hours as needed for nausea or vomiting. (Patient not taking: Reported on 06/03/2015) 6 each 0  . sucralfate (CARAFATE) 1 GM/10ML suspension Take 10 mLs (1 g total) by mouth 4 (four) times daily -  with meals and at bedtime. (Patient not taking: Reported on 06/04/2015) 420 mL 0    Allergies as of 06/03/2015 - Review Complete 06/03/2015  Allergen Reaction Noted  . Hydrocodone Nausea Only 09/28/2010  . Hydrocodone Nausea And Vomiting 11/27/2014    Family History  Problem Relation Age of Onset  . Hypertension Mother   . Heart disease Father 50    MI  . Stroke Father 55  . Diabetes Father   . GER disease Sister   . Cancer Maternal Grandmother     uterine and stomach    Social History   Social History  . Marital Status: Single    Spouse Name: N/A  . Number of Children: N/A  . Years of Education: N/A   Occupational History  . Not on file.   Social History Main Topics  . Smoking status: Current Every Day Smoker -- 0.30 packs/day    Types: Cigarettes  . Smokeless tobacco: Not on file  . Alcohol Use: No     Comment: occ  . Drug Use: No     Comment: abstinent for 1 year  . Sexual Activity: Yes    Birth Control/ Protection: Surgical   Other Topics Concern  . Not on file   Social History Narrative   ** Merged History Encounter **       Lives with a partner.  Never married.  6 children ages 73-20.   Finished 12th grade.  Unemployed.  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    Review of Systems:  Pertinent positive and negative review of systems were noted in the above HPI section.  All other review of systems was otherwise negative.  Physical Exam: Vital signs in last 24  hours: Temp:  [97.8 F (36.6 C)-99 F (37.2 C)] 98 F (36.7 C) (03/26 0822) Pulse Rate:  [61-120] 61 (03/26 0835) Resp:  [11-29] 17 (03/26 0835) BP: (102-122)/(68-92) 117/71 mmHg (03/26 0835) SpO2:  [98 %-100 %] 100 % (03/26 0835)   General:   Alert,  Well-developed, disheveled , AA female  cooperative in NAD Head:  Normocephalic and atraumatic. Eyes:  Sclera clear, no icterus.   Conjunctiva pink. Ears:  Normal auditory acuity. Nose:  No deformity, discharge,  or lesions. Mouth:  No deformity or lesions.   Neck:  Supple; no masses or thyromegaly. Lungs:  Clear throughout to auscultation.   No wheezes, crackles, or rhonchi.  Heart:  Regular rate and rhythm; no murmurs, clicks, rubs,  or gallops. Abdomen:  Soft,tender epigastrium, BS active, nonpalp mass or hsm.   Rectal:  Deferred  Msk:  Symmetrical without gross deformities. . Pulses:  Normal pulses noted. Extremities:  Without clubbing or edema. Neurologic:  Alert and  oriented x4;  grossly normal neurologically. Skin:  Multiple skin lesions/sores .Marland Kitchen Psych:  Alert and cooperative. Normal mood and affect flat.  Intake/Output from previous day: 03/25 0701 - 03/26 0700 In: 564 [Blood:564] Out: -  Intake/Output this shift:    Lab Results:  Recent Labs  06/01/15 2151 06/01/15 2204 06/03/15 2020 06/04/15 0515  WBC 13.5*  --  11.2* 11.9*  HGB 9.7* 11.9* 6.1* 7.7*  HCT 29.9* 35.0* 19.0* 23.5*  PLT 794*  --  562* 405*   BMET  Recent Labs  06/01/15 2204 06/03/15 2020 06/04/15 0515  NA 135 137 139  K 3.4* 3.7 3.5  CL 97* 105 108  CO2  --  24 24  GLUCOSE 89 130* 104*  BUN 8 22* 15  CREATININE 0.60 0.59 0.39*  CALCIUM  --  8.5* 8.1*   LFT  Recent Labs  06/03/15 2020  PROT 6.5  ALBUMIN 3.3*  AST 14*  ALT 11*  ALKPHOS 48  BILITOT 0.1*   PT/INR  Recent Labs  06/03/15 2122  LABPROT 14.5  INR 1.16     IMPRESSION:   #36 40 year old African-American female chronic polysubstance abuse or presenting  with epigastric pain, nausea, vomiting and melena with hemoglobin of 6.1. Rule out peptic ulcer disease, ulcerative esophagitis, occult neoplasm, Mallory-Weiss tear  #2 weight loss-probably secondary to ongoing drug abuse #3 anemia normocytic secondary to #1 #4 chronic abdominal pain  PLAN: Nothing by mouth Have scheduled for upper endoscopy with Dr. Fuller Plan today. Procedure discussed with the patient in detail including risks and benefits and she is agreeable to proceed IV PPI infusion has been started Transfuse to keep hemoglobin above 7 Further plans pending findings at EGD  [facility]  06/04/2015, 8:37 AM     Attending physician's note   I have taken a history, examined the patient and reviewed the chart. I agree with the Advanced Practitioner's note, impression and recommendations. UGI bleed with ABL anemia. R/O erosive esophagitis, ulcer, MW tear, etc. IV PPI infusion for now. Transfuse to keep Hb > 7. EGD today. The risks (including bleeding, perforation, infection, missed lesions, medication reactions and possible hospitalization or surgery if complications occur), benefits, and alternatives to endoscopy with possible biopsy and possible dilation were discussed with the patient and they consent to proceed.    Lucio Edward, MD Marval Regal [phone] Mon-Fri 8a-5p [phone] after 5p, weekends, holidays

## 2015-06-04 NOTE — Op Note (Signed)
[facility] Patient Name: [name] Procedure Date: 06/04/2015 MRN: [id number] Attending MD: Ladene Artist , MD Date of Birth: 04-12-1975 CSN:  Age: 40 Admit Type: Outpatient Procedure:                Upper GI endoscopy Indications:              Suspected upper gastrointestinal bleeding,                            Persistent vomiting of unknown cause, Melena Providers:                Pricilla Riffle. Fuller Plan, MD, Elna Breslow, RN, William Dalton, Technician Referring MD:             [facility] Medicines:                Diphenhydramine 25 mg IV, Fentanyl 50 micrograms                            IV, Midazolam 5 mg IV Complications:            No immediate complications. Estimated Blood Loss:     Estimated blood loss was minimal. Procedure:                Pre-Anesthesia Assessment:                           - Prior to the procedure, a History and Physical                            was performed, and patient medications and                            allergies were reviewed. The patient's tolerance of                            previous anesthesia was also reviewed. The risks                            and benefits of the procedure and the sedation                            options and risks were discussed with the patient.                            All questions were answered, and informed consent                            was obtained. Prior Anticoagulants: The patient has                            taken no previous anticoagulant or antiplatelet  agents. ASA Grade Assessment: III - A patient with                            severe systemic disease. After reviewing the risks                            and benefits, the patient was deemed in                            satisfactory condition to undergo the procedure.                           After obtaining informed consent, the endoscope was                passed under direct vision. Throughout the                            procedure, the patient's blood pressure, pulse, and                            oxygen saturations were monitored continuously. The                            [id number]) scope was introduced through the                            mouth, and advanced to the second part of duodenum.                            The upper GI endoscopy was somewhat difficult due                            to abnormal anatomy. Successful completion of the                            procedure was aided by using scope torsion. The                            patient tolerated the procedure well. Scope In: Scope Out: Findings:      One non-bleeding cratered duodenal ulcer with a visible vessel was found       in the duodenal bulb. The lesion was 14 mm in largest dimension. Area       was successfully injected with 3 mL of a 1:10,000 solution of       epinephrine for hemostasis. Coagulation for hemostasis using bipolar       probe was successful.      A severe post-ulcer deformity was found in the duodenal bulb with       partial obstruction.      The second portion of the duodenum was normal.      The prepyloric area was deformed. Otherwise the examined stomach was       normal.      The cardia and gastric fundus were normal on retroflexion.      The examined esophagus was normal.  Impression:               - Non-bleeding duodenal ulcer with a visible                            vessel. Injected. Treated with bipolar cautery.                           - Duodenal bulb deformity with partial obstruction.                           - Deformed prepyloric area.                           -. Moderate Sedation:      Moderate (conscious) sedation was administered by the endoscopy nurse       and supervised by the endoscopist. The following parameters were       monitored: oxygen saturation, heart rate, blood pressure, respiratory        rate, EKG, adequacy of pulmonary ventilation, and response to care.       Total physician intraservice time was 28 minutes. Recommendation:           - Return patient to hospital ward for ongoing care.                           - Clear liquid diet.                           - Protonix (pantoprazole): initiate therapy with 80                            mg IV bolus, then 8 mg/hr IV by continuous infusion.                           - No aspirin, ibuprofen, naproxen, or other                            non-steroidal anti-inflammatory drugs.                           - H pylori Ab and treat if positive Procedure Code(s):        --- Professional ---                           [id number], Esophagogastroduodenoscopy, flexible,                            transoral; with control of bleeding, any method                           99152, Moderate sedation services provided by the                            same physician or other qualified health care  professional performing the diagnostic or                            therapeutic service that the sedation supports,                            requiring the presence of an independent trained                            observer to assist in the monitoring of the                            patient's level of consciousness and physiological                            status; initial 15 minutes of intraservice time,                            patient age 33 years or older                           [phone], Moderate sedation services; each additional                            15 minutes intraservice time Diagnosis Code(s):        --- Professional ---                           K26.4, Chronic or unspecified duodenal ulcer with                            hemorrhage                           K31.89, Other diseases of stomach and duodenum                           R11.10, Vomiting, unspecified                           K92.1, Melena (includes  Hematochezia) CPT copyright 2016 American Medical Association. All rights reserved. The codes documented in this report are preliminary and upon coder review may  be revised to meet current compliance requirements. Ladene Artist, MD 06/04/2015 11:13:54 AM This report has been signed electronically. Number of Addenda: 0

## 2015-06-04 NOTE — Interval H&P Note (Signed)
History and Physical Interval Note:  06/04/2015 10:05 AM  [name]  has presented today for surgery, with the diagnosis of GI bleed  The various methods of treatment have been discussed with the patient and family. After consideration of risks, benefits and other options for treatment, the patient has consented to  Procedure(s): ESOPHAGOGASTRODUODENOSCOPY (EGD) (N/A) as a surgical intervention .  The patient's history has been reviewed, patient examined, no change in status, stable for surgery.  I have reviewed the patient's chart and labs.  Questions were answered to the patient's satisfaction.     Pricilla Riffle. Fuller Plan

## 2015-06-05 ENCOUNTER — Encounter (HOSPITAL_COMMUNITY): Payer: Self-pay | Admitting: Physician Assistant

## 2015-06-05 DIAGNOSIS — D62 Acute posthemorrhagic anemia: Secondary | ICD-10-CM | POA: Insufficient documentation

## 2015-06-05 DIAGNOSIS — K922 Gastrointestinal hemorrhage, unspecified: Secondary | ICD-10-CM

## 2015-06-05 DIAGNOSIS — K269 Duodenal ulcer, unspecified as acute or chronic, without hemorrhage or perforation: Secondary | ICD-10-CM | POA: Insufficient documentation

## 2015-06-05 LAB — CBC
HCT: 25.5 % — ABNORMAL LOW (ref 36.0–46.0)
HEMATOCRIT: 24.1 % — AB (ref 36.0–46.0)
HEMATOCRIT: 25.5 % — AB (ref 36.0–46.0)
HEMOGLOBIN: 8.1 g/dL — AB (ref 12.0–15.0)
HEMOGLOBIN: 8.3 g/dL — AB (ref 12.0–15.0)
Hemoglobin: 8.4 g/dL — ABNORMAL LOW (ref 12.0–15.0)
MCH: 28 pg (ref 26.0–34.0)
MCH: 28.3 pg (ref 26.0–34.0)
MCH: 28 pg (ref 26.0–34.0)
MCHC: 33.6 g/dL (ref 30.0–36.0)
MCHC: 32.9 g/dL (ref 30.0–36.0)
MCHC: 32.5 g/dL (ref 30.0–36.0)
MCV: 83.4 fL (ref 78.0–100.0)
MCV: 85.9 fL (ref 78.0–100.0)
MCV: 86.1 fL (ref 78.0–100.0)
PLATELETS: 360 10*3/uL (ref 150–400)
PLATELETS: 395 10*3/uL (ref 150–400)
Platelets: 344 10*3/uL (ref 150–400)
RBC: 2.89 MIL/uL — AB (ref 3.87–5.11)
RBC: 2.97 MIL/uL — ABNORMAL LOW (ref 3.87–5.11)
RBC: 2.96 MIL/uL — AB (ref 3.87–5.11)
RDW: 15.8 % — AB (ref 11.5–15.5)
RDW: 16.5 % — AB (ref 11.5–15.5)
RDW: 16.4 % — ABNORMAL HIGH (ref 11.5–15.5)
WBC: 8.4 10*3/uL (ref 4.0–10.5)
WBC: 8.9 10*3/uL (ref 4.0–10.5)
WBC: 10.6 10*3/uL — ABNORMAL HIGH (ref 4.0–10.5)

## 2015-06-05 LAB — TYPE AND SCREEN
ABO/RH(D): O NEG
ANTIBODY SCREEN: NEGATIVE
UNIT DIVISION: 0
Unit division: 0
Unit division: 0

## 2015-06-05 LAB — H. PYLORI ANTIBODY, IGG: H Pylori IgG: 7.9 U/mL — ABNORMAL HIGH (ref 0.0–0.8)

## 2015-06-05 LAB — HIV ANTIBODY (ROUTINE TESTING W REFLEX): HIV SCREEN 4TH GENERATION: NONREACTIVE

## 2015-06-05 MED ORDER — DIPHENHYDRAMINE HCL 25 MG PO CAPS
25.0000 mg | ORAL_CAPSULE | Freq: Three times a day (TID) | ORAL | Status: DC | PRN
  Filled 2015-06-05: qty 1

## 2015-06-05 MED ORDER — OXYCODONE HCL 5 MG PO TABS
5.0000 mg | ORAL_TABLET | Freq: Four times a day (QID) | ORAL | Status: DC | PRN
  Administered 2015-06-05 – 2015-06-07 (×7): 5 mg via ORAL
  Filled 2015-06-05 (×8): qty 1

## 2015-06-05 MED ORDER — ENSURE ENLIVE PO LIQD
237.0000 mL | Freq: Two times a day (BID) | ORAL | Status: DC
  Administered 2015-06-05 – 2015-06-07 (×4): 237 mL via ORAL

## 2015-06-05 NOTE — Progress Notes (Signed)
Initial Nutrition Assessment  DOCUMENTATION CODES:   Non-severe (moderate) malnutrition in context of acute illness/injury  INTERVENTION:   - D/C Boost Breeze. - Provide Ensure Enlive po BID, each supplement provides 350 kcals and 20 grams of protein.  NUTRITION DIAGNOSIS:   Malnutrition related to acute illness as evidenced by mild depletion of muscle mass, mild depletion of body fat, energy intake < 75% for > 7 days.  GOAL:   Patient will meet greater than or equal to 90% of their needs  MONITOR:   PO intake, Supplement acceptance, Diet advancement, Labs, Weight trends, Skin  REASON FOR ASSESSMENT:   Malnutrition Screening Tool    ASSESSMENT:   40 y.o. female chronic polysubstance abuser who presented with complaints of epigastric pain, nausea, vomiting and black stools over the past 2-3 days. She says she also vomited up some dark black material.  Pt with significant weight loss over the past 6 months according to patient and patient's family member. Patient had drug screen this admission positive for opiates, cocaine, THC and barbiturates.    S/p blood transfusion and esophagogastroduodenoscopy (EGD) on 3/26.  Patient reports that for 8 days PTA she was experiencing nausea and vomiting.  During this time she states that she was unable to eat or drink anything.  Prior to this time, she was consuming 3 meals per day with a good appetite.  Patient reports that today she is not experiencing any nausea and is really ready to eat a full meal.  Patient currently on FLD, consumed cream of chicken soup, strawberry ice cream, and a ginger ale for breakfast.  Patient states that she plans to order grits and beef broth for lunch.  Nutrition Focused Physical Exam was completed.  Findings include mild muscle depletion, mild fat depletion, and no edema.   Patient reports that she has lost a lot of weight since she got sick.  Reports a usual body weight of 160#, stating that she weighed this  prior to her 8 days of nausea and vomiting, which cannot be verified by chart review.  Patient with 7# / 5% weight loss x 6 months per chart review, insignificant for the time frame.  Patient was receiving Boost Breeze supplements which she enjoyed, but requested to receive Ensure Enlive now that she is on a FLD.  Provided Ensure at time of visit.  Dietetic Intern to order.  Medications and labs reviewed.  Diet Order:  Diet full liquid Room service appropriate?: Yes; Fluid consistency:: Thin  Skin:  Reviewed, no issues  Last BM:  3/26  Height:   Ht Readings from Last 1 Encounters:  06/04/15 5\' 1"  (1.549 m)    Weight:   Wt Readings from Last 1 Encounters:  06/05/15 123 lb 10.9 oz (56.1 kg)    Ideal Body Weight:  47.7 kg  BMI:  Body mass index is 23.38 kg/(m^2).  Estimated Nutritional Needs:   Kcal:  1700-1900  Protein:  70-80 grams  Fluid:  1.7-1.9 L  EDUCATION NEEDS:   No education needs identified at this time  Veronda Prude, Dietetic Intern Pager: [phone]

## 2015-06-05 NOTE — Progress Notes (Signed)
PROGRESS NOTE    [name]  [id number]  DOB: Sep 17, 1975  DOA: 06/03/2015 PCP: Andrena Mews, MD Outpatient Specialists:   Hospital course: 40 year old female with history of polysubstance abuse-tobacco, THC & cocaine, esophagitis by EGD 2011 by Dr. Collene Mares, GERD, IBS, depression, HTN, chronic abdominal pain, presented to ED with complaints of one-week history of hematemesis, 4 days history of melena, upper abdominal pain, progressive weakness, lightheadedness and feeling like she was going to pass out. Unable to keep oral intake down. Intermittent use of NSAIDs and Goody's powder. History of heavy menstrual bleeding (LMP 05/15/15). Baseline hemoglobin probably in the 10 g per DL range. Presented with hemoglobin 6.1. Admitting to stepdown unit for UGIB & ABLA. Hemoglobin improved to 7.7 status post 2 units PRBCs. [facility] consulted and s/p EGD 3/26 which showed deep duodenal ulcer with visible vessel.    Assessment & Plan:   Subacute upper GI bleed secondary to duodenal ulcer - DD: PUD, NSAID-induced gastritis, esophagitis, MW tear, neoplasm versus other etiologies. - Continue NPO, IVF, IV PPI infusion  - [facility] consulted and performed EGD 3/26 which showed deep duodenal ulcer with visible vessel which was treated. No overt bleeding since admission. - Advance diet as tolerated. GI follow-up appreciated.  Acute blood loss anemia - Secondary to upper GI bleed and chronic heavy menstrual bleeding. - Baseline hemoglobin approximately 10 g per DL. Presented with hemoglobin of 6.1. Hemoglobin improved to 7.7 status post 2 units PRBC night of admission. - Follow CBCs closely and transfuse if hemoglobin <7 g per DL. - Recommended outpatient GYN consultation to address heavy menstrual bleeding. - Patient dropped his hemoglobin again overnight to 6.7 and received a third unit PRBCs. Hemoglobin since then stable in the 8 g range. Follow CBC closely.  UTI - Started empirically on  Rocephin on admission. Unfortunately no urine cultures were sent. Continue same.   Polysubstance abuse-tobacco, THC & cocaine - Cessation counseled. Patient declines nicotine patch.  Significant weight loss - Patient reports significant weight loss over the last 6 months. CT abdomen and pelvis 06/02/15 unremarkable. - May be related to poor oral intake from upper GI issues. Check HIV: Nonreactive.  Chronic abdominal pain - Recent CT abdomen unremarkable. - Lipase normal. Urine pregnancy test 06/01/15: Negative.  Essential hypertension - Mildly uncontrolled. Holding HCTZ for now. Monitor off of medications given current GI bleed.    DVT prophylaxis: SCDs Code Status: Full Family Communication: None at bedside Disposition Plan: Admitted to stepdown unit. Monitor overnight in stepdown unit, transferred to medical floor 3/28 and possible discharge home 3/29   Consultants:  [facility]  Procedures:  EGD 3/26: Impression: - Non-bleeding duodenal ulcer with a visible   vessel. Injected. Treated with bipolar cautery.  - Duodenal bulb deformity with partial obstruction.  - Deformed prepyloric area.  -.  Recommendation:    - Return patient to hospital ward for ongoing care.  - Clear liquid diet.  - Protonix (pantoprazole): initiate therapy with 80   mg IV bolus, then 8 mg/hr IV by continuous infusion.  - No aspirin, ibuprofen, naproxen, or other   non-steroidal anti-inflammatory drugs.  - H pylori Ab and treat if positive  Antimicrobials:  None   Subjective: Seen this morning. No BM or emesis since hospitalization. No dizziness, lightheadedness or chest pain. Abdominal pain decreased. Dysuria. Asking for  "food".  Objective: Filed Vitals:   06/05/15 0818 06/05/15 1200 06/05/15 1313 06/05/15 1600  BP: 108/55  122/82   Pulse:  75   Temp:  98.4 F (36.9 C)  98.3 F (36.8 C)  TempSrc:  Oral  Oral  Resp: 11  21   Height:      Weight:      SpO2: 100%  100%     Intake/Output Summary (Last 24 hours) at 06/05/15 1724 Last data filed at 06/05/15 0900  Gross per 24 hour  Intake 5488.35 ml  Output      0 ml  Net 5488.35 ml   Filed Weights   06/04/15 2000 06/05/15 0640  Weight: 54.7 kg (120 lb 9.5 oz) 56.1 kg (123 lb 10.9 oz)    Exam:  General exam: Small built and thinly nourished pleasant young female sitting up comfortably in bed. Respiratory system: Clear. No increased work of breathing. Cardiovascular system: S1 & S2 heard, RRR. No JVD, murmurs, gallops, clicks or pedal edema. Telemetry: Sinus rhythm. Gastrointestinal system: Abdomen is nondistended, soft. Mild epigastric/periumbilical tenderness without peritoneal signs. Normal bowel sounds heard. Central nervous system: Alert and oriented. No focal neurological deficits. Extremities: Symmetric 5 x 5 power.   Data Reviewed: Basic Metabolic Panel:  Recent Labs Lab 06/01/15 2204 06/03/15 2020 06/04/15 0515  NA 135 137 139  K 3.4* 3.7 3.5  CL 97* 105 108  CO2  --  24 24  GLUCOSE 89 130* 104*  BUN 8 22* 15  CREATININE 0.60 0.59 0.39*  CALCIUM  --  8.5* 8.1*   Liver Function Tests:  Recent Labs Lab 06/03/15 2020  AST 14*  ALT 11*  ALKPHOS 48  BILITOT 0.1*  PROT 6.5  ALBUMIN 3.3*    Recent Labs Lab 06/03/15 2020  LIPASE 30   No results for input(s): AMMONIA in the last 168 hours. CBC:  Recent Labs Lab 06/04/15 0515 06/04/15 1344 06/04/15 2111 06/05/15 0312 06/05/15 1253  WBC 11.9* 12.2* 9.0 8.4 8.9  HGB 7.7* 7.8* 6.7* 8.1* 8.4*  HCT 23.5* 23.7* 19.6* 24.1* 25.5*  MCV 82.5 83.7 83.8 83.4 85.9  PLT 405* 398 345 344 360   Cardiac Enzymes: No results for input(s): CKTOTAL, CKMB, CKMBINDEX,  TROPONINI in the last 168 hours. BNP (last 3 results) No results for input(s): PROBNP in the last 8760 hours. CBG: No results for input(s): GLUCAP in the last 168 hours.  Recent Results (from the past 240 hour(s))  MRSA PCR Screening     Status: Abnormal   Collection Time: 06/04/15  3:50 PM  Result Value Ref Range Status   MRSA by PCR POSITIVE (A) NEGATIVE Final    Comment:        The GeneXpert MRSA Assay (FDA approved for NASAL specimens only), is one component of a comprehensive MRSA colonization surveillance program. It is not intended to diagnose MRSA infection nor to guide or monitor treatment for MRSA infections. RESULT CALLED TO, READ BACK BY AND VERIFIED WITH: D.THUCHMAN AT 1806 ON 06/04/15 BY S.VANHOORNE          Studies: No results found.      Scheduled Meds: . cefTRIAXone (ROCEPHIN)  IV  1 g Intravenous Q24H  . Chlorhexidine Gluconate Cloth  6 each Topical Q0600  . feeding supplement (ENSURE ENLIVE)  237 mL Oral BID BM  . mupirocin ointment  1 application Nasal BID  . [START ON 06/07/2015] pantoprazole (PROTONIX) IV  40 mg Intravenous Q12H   Continuous Infusions: . sodium chloride 100 mL/hr at 06/04/15 1756  . pantoprozole (PROTONIX) infusion 8 mg/hr (06/05/15 1218)    Principal Problem:   GI bleed  Active Problems:   Hypertension   Tobacco abuse   Symptomatic anemia   Polysubstance abuse   Thrombocytosis (HCC)   Chronic abdominal pain   Cocaine abuse    Time spent: 25 minutes.    Vernell Leep, MD, FACP, FHM. [facility] Pager [phone]  If 7PM-7AM, please contact night-coverage [email or web address] Password [email or web address] 06/05/2015, 5:24 PM    LOS: 2 days

## 2015-06-05 NOTE — Progress Notes (Signed)
Patient ID: [name], female   DOB: Oct 26, 1975, 40 y.o.   MRN: [id number]    Progress Note   Subjective   feels a lot better than yesterday, hungry, no significant pain, no vomiting  HGB up to 8.1 post transfusions   Objective   Vital signs in last 24 hours: Temp:  [97.7 F (36.5 C)-98.9 F (37.2 C)] 98 F (36.7 C) (03/27 0800) Pulse Rate:  [66-105] 72 (03/27 0040) Resp:  [11-18] 11 (03/27 0818) BP: (94-149)/(54-119) 108/55 mmHg (03/27 0818) SpO2:  [97 %-100 %] 100 % (03/27 0818) Weight:  [120 lb 9.5 oz (54.7 kg)-123 lb 10.9 oz (56.1 kg)] 123 lb 10.9 oz (56.1 kg) (03/27 0640) Last BM Date: 06/04/15 General:    AA  female in NAD, very talkative Heart:  Regular rate and rhythm; no murmurs Lungs: Respirations even and unlabored, lungs CTA bilaterally Abdomen:  Soft, nontender and nondistended. Normal bowel sounds. Extremities:  Without edema. Neurologic:  Alert and oriented,  grossly normal neurologically. Psych:  Cooperative. Normal mood and affect.  Intake/Output from previous day: 03/26 0701 - 03/27 0700 In: 5438.4 [P.O.:1200; I.V.:3858.4; Blood:330; IV Piggyback:50] Out: -  Intake/Output this shift: Total I/O In: 50 [I.V.:50] Out: -   Lab Results:  Recent Labs  06/04/15 1344 06/04/15 2111 06/05/15 0312  WBC 12.2* 9.0 8.4  HGB 7.8* 6.7* 8.1*  HCT 23.7* 19.6* 24.1*  PLT 398 345 344   BMET  Recent Labs  06/03/15 2020 06/04/15 0515  NA 137 139  K 3.7 3.5  CL 105 108  CO2 24 24  GLUCOSE 130* 104*  BUN 22* 15  CREATININE 0.59 0.39*  CALCIUM 8.5* 8.1*   LFT  Recent Labs  06/03/15 2020  PROT 6.5  ALBUMIN 3.3*  AST 14*  ALT 11*  ALKPHOS 48  BILITOT 0.1*   PT/INR  Recent Labs  06/03/15 2122  LABPROT 14.5  INR 1.16      Assessment / Plan:    #1 40 yo female with acute GI bleed secondary to deep duodenal ulcer with visible vessel and duodenal deformity Stable- no active bleeding overnight Advance to full liquid Continue IV PPI  infusion   #2 anemia - secondary  to blood loss #3 Substance abuse- suspect cocaine and heroin- pt says she is done..  Plan; Stop Dilaudid  Offer po analgesic Serial hgb s-transfuse to keep hgb 7  Ok to tranfer to regular bed  needs to say in hospital another 48 hours Will follow up Hpylori AB  Principal Problem:   GI bleed Active Problems:   Hypertension   Tobacco abuse   Symptomatic anemia   Polysubstance abuse   Thrombocytosis ([place])   Chronic abdominal pain   Cocaine abuse     LOS: 2 days   Amy Esterwood  06/05/2015, 9:34 AM   Attending physician's note   I have taken an interval history, reviewed the chart and examined the patient. I agree with the Advanced Practitioner's note, impression and recommendations. No evidence of further bleeding, responded appropriately to pRBC transfusion. Cont PPI infusion for total of 72 hours and switch to PPI twice daily after that. Will need to stay on BID PPI for next 6-8 weeks. OK to advance diet to low residue soft. Monitor Hgb and transfuse as needed to maintain Hgb>7. Discussed in detail with patient to avoid NSAIDs, abstain ETOH and drugs. F/U H.pylori and treat if positive  K Denzil Magnuson, MD [phone] Mon-Fri 8a-5p [phone] after 5p, weekends, holidays

## 2015-06-05 NOTE — Care Management Note (Signed)
Case Management Note  Patient Details  Name: [name] MRN: [id number] Date of Birth: 19-Jan-1976  Subjective/Objective:        anemia            Action/Plan:Date:  June 05, 2015 Chart reviewed for concurrent status and case management needs. Will continue to follow patient for changes and needs: Velva Harman, BSN, RN, [place]   [phone]   Expected Discharge Date:  06/07/15               Expected Discharge Plan:  Home/Self Care  In-House Referral:  NA  Discharge planning Services  CM Consult  Post Acute Care Choice:  NA Choice offered to:  NA  DME Arranged:    DME Agency:     HH Arranged:    HH Agency:     Status of Service:  Completed, signed off  Medicare Important Message Given:    Date Medicare IM Given:    Medicare IM give by:    Date Additional Medicare IM Given:    Additional Medicare Important Message give by:     If discussed at [place] of Stay Meetings, dates discussed:    Additional Comments:  Leeroy Cha, RN 06/05/2015, 10:05 AM

## 2015-06-05 NOTE — Progress Notes (Signed)
CRITICAL VALUE ALERT  Critical value received:  hgb 6.7  Date of notification:  3/26  Time of notification:  2133  Critical value read back:Yes.    Nurse who received alert:  mk  MD notified (1st page):  Schorr, NP  Time of first page:  2135  MD notified (2nd page):  Time of second page:  Responding MD:  Hilbert Bible, NP  Time MD responded:  2140

## 2015-06-06 ENCOUNTER — Encounter (HOSPITAL_COMMUNITY): Payer: Self-pay | Admitting: Gastroenterology

## 2015-06-06 DIAGNOSIS — F1410 Cocaine abuse, uncomplicated: Secondary | ICD-10-CM

## 2015-06-06 LAB — BASIC METABOLIC PANEL
ANION GAP: 6 (ref 5–15)
CHLORIDE: 110 mmol/L (ref 101–111)
CO2: 23 mmol/L (ref 22–32)
Calcium: 7.9 mg/dL — ABNORMAL LOW (ref 8.9–10.3)
Creatinine, Ser: 0.47 mg/dL (ref 0.44–1.00)
Glucose, Bld: 87 mg/dL (ref 65–99)
POTASSIUM: 3.9 mmol/L (ref 3.5–5.1)
SODIUM: 139 mmol/L (ref 135–145)

## 2015-06-06 LAB — CBC
HCT: 23.8 % — ABNORMAL LOW (ref 36.0–46.0)
HEMOGLOBIN: 8 g/dL — AB (ref 12.0–15.0)
MCH: 28.8 pg (ref 26.0–34.0)
MCHC: 33.6 g/dL (ref 30.0–36.0)
MCV: 85.6 fL (ref 78.0–100.0)
PLATELETS: 375 10*3/uL (ref 150–400)
RBC: 2.78 MIL/uL — AB (ref 3.87–5.11)
RDW: 16.4 % — ABNORMAL HIGH (ref 11.5–15.5)
WBC: 11 10*3/uL — AB (ref 4.0–10.5)

## 2015-06-06 MED ORDER — PANTOPRAZOLE SODIUM 40 MG PO TBEC
40.0000 mg | DELAYED_RELEASE_TABLET | Freq: Two times a day (BID) | ORAL | Status: DC
  Administered 2015-06-06 – 2015-06-07 (×2): 40 mg via ORAL
  Filled 2015-06-06 (×2): qty 1

## 2015-06-06 MED ORDER — ALUM & MAG HYDROXIDE-SIMETH 200-200-20 MG/5ML PO SUSP
15.0000 mL | Freq: Four times a day (QID) | ORAL | Status: DC | PRN
  Administered 2015-06-06 – 2015-06-07 (×5): 15 mL via ORAL
  Filled 2015-06-06 (×6): qty 30

## 2015-06-06 NOTE — Progress Notes (Signed)
PROGRESS NOTE    MAHAGANY LISKE  [id number]  DOB: 1975/12/24  DOA: 06/03/2015 PCP: Andrena Mews, MD Outpatient Specialists:   Hospital course: 40 year old female with history of polysubstance abuse-tobacco, THC & cocaine, esophagitis by EGD 2011 by Dr. Collene Mares, GERD, IBS, depression, HTN, chronic abdominal pain, presented to ED with complaints of one-week history of hematemesis, 4 days history of melena, upper abdominal pain, progressive weakness, lightheadedness and feeling like she was going to pass out. Unable to keep oral intake down. Intermittent use of NSAIDs and Goody's powder. History of heavy menstrual bleeding (LMP 05/15/15). Baseline hemoglobin probably in the 10 g per DL range. Presented with hemoglobin 6.1. Admitting to stepdown unit for UGIB & ABLA. Hemoglobin improved to 7.7 status post 2 units PRBCs. [facility] consulted and s/p EGD 3/26 which showed deep duodenal ulcer with visible vessel. Positive H pylori Ab. GI bleeding has clinically stopped. Hemoglobin stable. Initially admitted to stepdown unit. Transferring to medical floor 3/28 and possible discharge home 3/29. Case management consulted for home medication needs.   Assessment & Plan:   Subacute upper GI bleed secondary to duodenal ulcer from NSAIDs and H pylori (antibody positive) - Treated initially with NPO, IVF, IV PPI infusion  - [facility] consulted and performed EGD 3/26 which showed deep duodenal ulcer with visible vessel which was treated. No overt bleeding since admission. - Diet was gradually advanced which she has tolerated. GI recommends transitioning to oral twice a day PPI and treat H. pylori with Pylera 10-14 day course upon discharge. Case management consulted for medication assistance upon discharge. Patient counseled to avoid NSAIDs, alcohol and drugs. - Needs to follow-up with [facility] in 4-6 weeks.  Acute blood loss anemia - Secondary to upper GI bleed and chronic heavy menstrual bleeding. -  Baseline hemoglobin approximately 10 g per DL. Presented with hemoglobin of 6.1. Hemoglobin improved to 7.7 status post 2 units PRBC night of admission. - Follow CBCs closely and transfuse if hemoglobin <7 g per DL. - Recommended outpatient GYN consultation to address heavy menstrual bleeding. Patient verbalized understanding. - Patient dropped his hemoglobin again overnight 3/26 to 6.7 and received a third unit PRBCs. Hemoglobin since then stable in the 8 g range. Follow CBC in a.m. May consider iron supplements and a couple of weeks.  UTI - Started empirically on Rocephin on admission. Unfortunately no urine cultures were sent. Continue same. Patient had dysuria on admission which is improved/resolved. Day 3 of Rocephin today.   Polysubstance abuse-tobacco, THC & cocaine - Cessation counseled. Patient declines nicotine patch.  Significant weight loss - Patient reports significant weight loss over the last 6 months. CT abdomen and pelvis 06/02/15 unremarkable. - May be related to poor oral intake from upper GI issues. Check HIV: Nonreactive.  Chronic abdominal pain - Recent CT abdomen unremarkable. - Lipase normal. Urine pregnancy test 06/01/15: Negative.  Essential hypertension - Mildly uncontrolled. Holding HCTZ for now. Monitor off of medications given current GI bleed.  Non-severe (moderate) malnutrition in context of acute illness/injury    DVT prophylaxis: SCDs Code Status: Full Family Communication: None at bedside Disposition Plan: Admitted to stepdown unit. Transfer to medical floor 3/28 and possible discharge home 3/29.  Consultants:  [facility]  Procedures:  EGD 3/26: Impression: - Non-bleeding duodenal ulcer with a visible   vessel. Injected. Treated with bipolar cautery.  - Duodenal bulb deformity with partial obstruction.  - Deformed prepyloric  area.  -.  Recommendation:    - Return patient  to hospital ward for ongoing care.  - Clear liquid diet.  - Protonix (pantoprazole): initiate therapy with 80   mg IV bolus, then 8 mg/hr IV by continuous infusion.  - No aspirin, ibuprofen, naproxen, or other   non-steroidal anti-inflammatory drugs.  - H pylori Ab and treat if positive  Antimicrobials:  None   Subjective: Melena resolved. States she had greenish-colored stools this morning. Abdominal pain improved. Tolerating full liquid diet and wants to start regular diet. No dizziness, lightheadedness, dyspnea or chest pain.  Objective: Filed Vitals:   06/06/15 0522 06/06/15 0700 06/06/15 0800 06/06/15 0803  BP: 118/70     Pulse:      Temp: 99 F (37.2 C)   98.8 F (37.1 C)  TempSrc: Oral     Resp: 20 15 14    Height:      Weight:      SpO2: 99%       Intake/Output Summary (Last 24 hours) at 06/06/15 1128 Last data filed at 06/06/15 0900  Gross per 24 hour  Intake 2566.67 ml  Output      0 ml  Net 2566.67 ml   Filed Weights   06/04/15 2000 06/05/15 0640  Weight: 54.7 kg (120 lb 9.5 oz) 56.1 kg (123 lb 10.9 oz)    Exam:  General exam: Small built and thinly nourished pleasant young female sitting up comfortably in bed. Respiratory system: Clear. No increased work of breathing. Cardiovascular system: S1 & S2 heard, RRR. No JVD, murmurs, gallops, clicks or pedal edema. Telemetry: Sinus rhythm. Gastrointestinal system: Abdomen is nondistended, soft. Mild epigastric/periumbilical tenderness without peritoneal signs. Normal bowel sounds heard. Central nervous system: Alert and oriented. No focal neurological deficits. Extremities: Symmetric 5 x 5 power.   Data Reviewed: Basic Metabolic Panel:  Recent Labs Lab 06/01/15 2204  06/03/15 2020 06/04/15 0515 06/06/15 0554  NA 135 137 139 139  K 3.4* 3.7 3.5 3.9  CL 97* 105 108 110  CO2  --  24 24 23   GLUCOSE 89 130* 104* 87  BUN 8 22* 15 <5*  CREATININE 0.60 0.59 0.39* 0.47  CALCIUM  --  8.5* 8.1* 7.9*   Liver Function Tests:  Recent Labs Lab 06/03/15 2020  AST 14*  ALT 11*  ALKPHOS 48  BILITOT 0.1*  PROT 6.5  ALBUMIN 3.3*    Recent Labs Lab 06/03/15 2020  LIPASE 30   No results for input(s): AMMONIA in the last 168 hours. CBC:  Recent Labs Lab 06/04/15 2111 06/05/15 0312 06/05/15 1253 06/05/15 2117 06/06/15 0554  WBC 9.0 8.4 8.9 10.6* 11.0*  HGB 6.7* 8.1* 8.4* 8.3* 8.0*  HCT 19.6* 24.1* 25.5* 25.5* 23.8*  MCV 83.8 83.4 85.9 86.1 85.6  PLT 345 344 360 395 375   Cardiac Enzymes: No results for input(s): CKTOTAL, CKMB, CKMBINDEX, TROPONINI in the last 168 hours. BNP (last 3 results) No results for input(s): PROBNP in the last 8760 hours. CBG: No results for input(s): GLUCAP in the last 168 hours.  Recent Results (from the past 240 hour(s))  MRSA PCR Screening     Status: Abnormal   Collection Time: 06/04/15  3:50 PM  Result Value Ref Range Status   MRSA by PCR POSITIVE (A) NEGATIVE Final    Comment:        The GeneXpert MRSA Assay (FDA approved for NASAL specimens only), is one component of a comprehensive MRSA colonization surveillance program. It is not intended to diagnose MRSA infection nor to guide or monitor treatment  for MRSA infections. RESULT CALLED TO, READ BACK BY AND VERIFIED WITH: D.THUCHMAN AT 1806 ON 06/04/15 BY S.VANHOORNE          Studies: No results found.      Scheduled Meds: . cefTRIAXone (ROCEPHIN)  IV  1 g Intravenous Q24H  . Chlorhexidine Gluconate Cloth  6 each Topical Q0600  . feeding supplement (ENSURE ENLIVE)  237 mL Oral BID BM  . mupirocin ointment  1 application Nasal BID  . [START ON 06/07/2015] pantoprazole (PROTONIX) IV  40 mg Intravenous Q12H   Continuous Infusions: .  pantoprozole (PROTONIX) infusion 8 mg/hr (06/06/15 0900)    Principal Problem:   GI bleed Active Problems:   Hypertension   Tobacco abuse   Symptomatic anemia   Polysubstance abuse   Thrombocytosis (HCC)   Chronic abdominal pain   Cocaine abuse   Duodenal ulcer   Acute blood loss anemia    Time spent: 25 minutes.    Vernell Leep, MD, FACP, FHM. [facility] Pager [phone]  If 7PM-7AM, please contact night-coverage [email or web address] Password [email or web address] 06/06/2015, 11:28 AM    LOS: 3 days

## 2015-06-06 NOTE — Progress Notes (Addendum)
      Gastroenterology Progress Note  Subjective:  Feels great.  Wants some eggs and bacon.  Stool this AM was brown.  Hgb stable around 8 grams.  Objective:  Vital signs in last 24 hours: Temp:  [98.3 F (36.8 C)-99 F (37.2 C)] 98.8 F (37.1 C) (03/28 0803) Pulse Rate:  [75-90] 90 (03/27 2000) Resp:  [13-21] 14 (03/28 0800) BP: (97-122)/(55-82) 118/70 mmHg (03/28 0522) SpO2:  [99 %-100 %] 99 % (03/28 0522) Last BM Date: 06/05/15 General:  Alert, Well-developed, in NAD; very talkative. Heart:  Regular rate and rhythm; no murmurs Pulm:  CTAB.  No W/R/R. Abdomen:  Soft, non-distended.  BS present.  Non-tender. Extremities:  Without edema. Neurologic:  Alert and oriented x 4;  grossly normal neurologically.  Intake/Output from previous day: 03/27 0701 - 03/28 0700 In: 2691.7 [P.O.:480; I.V.:2161.7; IV Piggyback:50] Out: -  Intake/Output this shift: Total I/O In: 375 [I.V.:375] Out: -   Lab Results:  Recent Labs  06/05/15 1253 06/05/15 2117 06/06/15 0554  WBC 8.9 10.6* 11.0*  HGB 8.4* 8.3* 8.0*  HCT 25.5* 25.5* 23.8*  PLT 360 395 375   BMET  Recent Labs  06/03/15 2020 06/04/15 0515 06/06/15 0554  NA 137 139 139  K 3.7 3.5 3.9  CL 105 108 110  CO2 24 24 23   GLUCOSE 130* 104* 87  BUN 22* 15 <5*  CREATININE 0.59 0.39* 0.47  CALCIUM 8.5* 8.1* 7.9*   LFT  Recent Labs  06/03/15 2020  PROT 6.5  ALBUMIN 3.3*  AST 14*  ALT 11*  ALKPHOS 48  BILITOT 0.1*   PT/INR  Recent Labs  06/03/15 2122  LABPROT 14.5  INR 1.16   Assessment / Plan: #1 40 yo female with acute GI bleed secondary to deep duodenal ulcer with visible vessel and duodenal deformity.  Probably due to combination of NSAID's and Hpylori (Ab was positive). Stable- no further sign of bleeding. Advance to regular diet. PPI to BID once infusion order is complete. #2 Anemia - secondary to blood loss.  Hgb stable around 8 grams. #3 Substance abuse- suspect cocaine and heroin #4   Positive Hpylori Ab  *Transfer to regular floor today and hopefully discharge tomorrow. *Will need to begin Pylera treatment upon discharge for 14 days.  May need social worker's help with getting the medication for this as she does not have insurance.   LOS: 3 days   ZEHR, JESSICA D.  06/06/2015, 9:24 AM  Pager number [phone]    Attending physician's note   I have taken an interval history, reviewed the chart and examined the patient. I agree with the Advanced Practitioner's note, impression and recommendations. No further bleeding, brown BM. Hgb stable. Ok to advance diet to regular. Can transition to PPI BID PO. Will need to treat H.pylori, Pylera 10-14 day course. Follow up in office in 4- 6 weeks. Avoid NSAID, ETOH and drugs. Patient will need financial assistance and guidance to get health insurance to cover prescriptions on discharge. OK to DC home tomorrow AM  K Denzil Magnuson, MD [phone] Mon-Fri 8a-5p [phone] after 5p, weekends, holidays

## 2015-06-07 DIAGNOSIS — Z720 Tobacco use: Secondary | ICD-10-CM

## 2015-06-07 DIAGNOSIS — I10 Essential (primary) hypertension: Secondary | ICD-10-CM

## 2015-06-07 DIAGNOSIS — D473 Essential (hemorrhagic) thrombocythemia: Secondary | ICD-10-CM

## 2015-06-07 LAB — CBC
HCT: 22.6 % — ABNORMAL LOW (ref 36.0–46.0)
Hemoglobin: 7.5 g/dL — ABNORMAL LOW (ref 12.0–15.0)
MCH: 27.9 pg (ref 26.0–34.0)
MCHC: 33.2 g/dL (ref 30.0–36.0)
MCV: 84 fL (ref 78.0–100.0)
PLATELETS: 390 10*3/uL (ref 150–400)
RBC: 2.69 MIL/uL — AB (ref 3.87–5.11)
RDW: 16.4 % — AB (ref 11.5–15.5)
WBC: 11.1 10*3/uL — ABNORMAL HIGH (ref 4.0–10.5)

## 2015-06-07 MED ORDER — DOXYCYCLINE HYCLATE 100 MG PO TABS: 100.0000 mg | ORAL_TABLET | Freq: Two times a day (BID) | ORAL | Status: DC

## 2015-06-07 MED ORDER — OMEPRAZOLE 40 MG PO CPDR: 40.0000 mg | DELAYED_RELEASE_CAPSULE | Freq: Two times a day (BID) | ORAL | Status: DC

## 2015-06-07 MED ORDER — METRONIDAZOLE 250 MG PO TABS: 250.0000 mg | ORAL_TABLET | Freq: Four times a day (QID) | ORAL | Status: DC

## 2015-06-07 MED ORDER — BISMUTH SUBSALICYLATE 262 MG PO TABS: 524.0000 mg | ORAL_TABLET | Freq: Four times a day (QID) | ORAL | Status: DC

## 2015-06-07 MED ORDER — TRAMADOL HCL 50 MG PO TABS: 25.0000 mg | ORAL_TABLET | Freq: Four times a day (QID) | ORAL | Status: DC | PRN

## 2015-06-07 NOTE — Discharge Summary (Addendum)
Physician Discharge Summary  ASHAI CASABLANCA [id number] DOB: 27-Sep-1975 DOA: 06/03/2015  PCP: Andrena Mews, MD  Admit date: 06/03/2015 Discharge date: 06/07/2015  Time spent: 40 minutes  Recommendations for Outpatient Follow-up:  1. Follow-up with GI as outpatient in 3-4 weeks, to ensure H. pylori eradication   Discharge Diagnoses:  Principal Problem:   GI bleed Active Problems:   Hypertension   Tobacco abuse   Symptomatic anemia   Polysubstance abuse   Thrombocytosis (HCC)   Chronic abdominal pain   Cocaine abuse   Duodenal ulcer   Acute blood loss anemia   Discharge Condition: Stable  Diet recommendation: Regular diet  Filed Weights   06/04/15 2000 06/05/15 0640 06/06/15 1320  Weight: 54.7 kg (120 lb 9.5 oz) 56.1 kg (123 lb 10.9 oz) 60.374 kg (133 lb 1.6 oz)    History of present illness:  [name] is a 40 y.o. female with a history of HTN, IBS, Chronic ABD Pain and history of Polysubstance Abuse who presents to the ED with complaints of Nausea and Vomiting, Hematemesis, and Melena x 1 week. She reports having increased weakness and lightheadedness, and feeling as if she would faint over there past 2 days. She has not been able to hold down foods or liquids during this time. She reports that she took [place] powders for pain off and on but not often. In the ED, she was found to have an initial hemoglobin of 6.1 and a Heme+ FOBT, her hemoglobin 2 days ago was 9.7. Orders were given for transfusion, and an IV Protonix drip was ordered, and GI was consulted and is to see in the AM, The EDP spoke with GI Dr. Fuller Plan. She was referred for admission for SDU monitoring.   Hospital Course:   Subacute upper GI bleed secondary to duodenal ulcer from NSAIDs and H pylori (antibody positive) - Treated initially with NPO, IVF, IV PPI infusion  - [facility] consulted and performed EGD 3/26 which showed deep duodenal ulcer with visible vessel which was treated. No  overt bleeding since admission. - Diet was gradually advanced which she has tolerated.  - GI recommends transitioning to oral twice a day PPI and treat H. pylori with Pylera 10-14 day course upon discharge.  -Patient has history of positive H. pylori in 2012, H. pylori IgG positive this time. -Pylera recommended by GI, secondary to the cost was broken down to omeprazole twice a day, Flagyl 4 times a day, doxycycline twice a day (tetracycline is on back order) and bismuth salts. -Patient reported she will not leave the hospital without pain medications. -Tramadol prescribed 10 pills, take half pill as needed for pain.  Acute blood loss anemia - Secondary to upper GI bleed and chronic heavy menstrual bleeding. - Baseline hemoglobin approximately 10 g per DL. Presented with hemoglobin of 6.1. Hemoglobin improved to 7.7 status post 2 units PRBC night of admission. - Follow CBCs closely and transfuse if hemoglobin <7 g per DL. - Recommended outpatient GYN consultation to address heavy menstrual bleeding. Patient verbalized understanding. - Patient dropped his hemoglobin again overnight 3/26 to 6.7 and received a third unit PRBCs. Hemoglobin since then stable in the 8 g range.  -Hemoglobin is 7.5 on discharge.  UTI - Started empirically on Rocephin on admission. Unfortunately no urine cultures were sent. Continue same.  -Patient received total of 4 days of IV antibiotics, no antibiotics on discharge.  Polysubstance abuse-tobacco, THC & cocaine - Cessation counseled. Patient declines nicotine patch, positive for barbiturates,  opiates, cocaine and THC.  Significant weight loss - Patient reports significant weight loss over the last 6 months. CT abdomen and pelvis 06/02/15 unremarkable. - She lost 20 pounds from May 2016, negative HIV, TSH is normal.  Chronic abdominal pain - Recent CT abdomen unremarkable. - Lipase normal. Urine pregnancy test 06/01/15: Negative.  Essential hypertension -  Mildly uncontrolled. Holding HCTZ for now. Monitor off of medications given current GI bleed.  Non-severe (moderate) malnutrition in context of acute illness/injury.   Procedures:  Endoscopy done by Dr. Jacinto Halim on 06/04/2015. Impression:- Non-bleeding duodenal ulcer with a visible vessel. Injected. Treated with bipolar cautery.  - Duodenal bulb deformity with partial obstruction.  - Deformed prepyloric area.  Consultations:  GI  Discharge Exam: Filed Vitals:   06/06/15 2106 06/07/15 0536  BP: 109/60 122/77  Pulse: 87 79  Temp: 99.2 F (37.3 C) 98 F (36.7 C)  Resp: 16 16  General: Alert and awake, oriented x3, not in any acute distress. HEENT: anicteric sclera, pupils reactive to light and accommodation, EOMI CVS: S1-S2 clear, no murmur rubs or gallops Chest: clear to auscultation bilaterally, no wheezing, rales or rhonchi Abdomen: soft nontender, nondistended, normal bowel sounds, no organomegaly Extremities: no cyanosis, clubbing or edema noted bilaterally Neuro: Cranial nerves II-XII intact, no focal neurological deficits  Discharge Instructions   Discharge Instructions    Diet - low sodium heart healthy    Complete by:  As directed      Increase activity slowly    Complete by:  As directed           Current Discharge Medication List    START taking these medications   Details  Bismuth Subsalicylate 99991111 MG TABS Take 2 tablets (524 mg total) by mouth 4 (four) times daily. Qty: 112 each, Refills: 0    doxycycline (VIBRA-TABS) 100 MG tablet Take 1 tablet (100 mg total) by mouth 2 (two) times daily. Qty: 28 tablet, Refills: 0    metroNIDAZOLE (FLAGYL) 250 MG tablet Take 1 tablet (250 mg total) by mouth 4 (four) times daily. Qty: 56 tablet, Refills: 0    traMADol (ULTRAM) 50 MG tablet Take 0.5 tablets (25 mg total) by mouth every 6 (six) hours as needed. Qty: 10 tablet, Refills: 0      CONTINUE these  medications which have CHANGED   Details  omeprazole (PRILOSEC) 40 MG capsule Take 1 capsule (40 mg total) by mouth 2 (two) times daily. Qty: 60 capsule, Refills: 1      STOP taking these medications     calcium carbonate (TUMS - DOSED IN MG ELEMENTAL CALCIUM) 500 MG chewable tablet      hydrochlorothiazide (HYDRODIURIL) 25 MG tablet      naproxen sodium (ANAPROX) 220 MG tablet      sucralfate (CARAFATE) 1 G tablet      cephALEXin (KEFLEX) 500 MG capsule      cephALEXin (KEFLEX) 500 MG capsule      dicyclomine (BENTYL) 20 MG tablet      hydrOXYzine (ATARAX/VISTARIL) 25 MG tablet      ondansetron (ZOFRAN ODT) 4 MG disintegrating tablet      ondansetron (ZOFRAN ODT) 4 MG disintegrating tablet      ondansetron (ZOFRAN ODT) 8 MG disintegrating tablet      permethrin (ELIMITE) 5 % cream      promethazine (PHENERGAN) 25 MG suppository      sucralfate (CARAFATE) 1 GM/10ML suspension        Allergies  Allergen Reactions  .  Hydrocodone Nausea And Vomiting   Follow-up Information    Follow up with Harl Bowie, MD In 3 weeks.   Specialty:  Gastroenterology   Contact information:   [address] [place] [id number] [phone]        The results of significant diagnostics from this hospitalization (including imaging, microbiology, ancillary and laboratory) are listed below for reference.    Significant Diagnostic Studies: Dg Chest 2 View  06/01/2015  CLINICAL DATA:  40 year old female with chest pain EXAM: CHEST  2 VIEW COMPARISON:  Radiograph dated 12/21/2014 FINDINGS: The heart size and mediastinal contours are within normal limits. Both lungs are clear. The visualized skeletal structures are unremarkable. IMPRESSION: No active cardiopulmonary disease. Electronically Signed   By: Anner Crete M.D.   On: 06/01/2015 21:47   Ct Abdomen Pelvis W Contrast  06/02/2015  CLINICAL DATA:  Constant ongoing chest pain for 2 days. Nausea and vomiting. Hematemesis  and black stools. EXAM: CT ABDOMEN AND PELVIS WITH CONTRAST TECHNIQUE: Multidetector CT imaging of the abdomen and pelvis was performed using the standard protocol following bolus administration of intravenous contrast. CONTRAST:  38mL OMNIPAQUE IOHEXOL 300 MG/ML SOLN, 15mL ISOVUE-300 IOPAMIDOL (ISOVUE-300) INJECTION 61% COMPARISON:  11/27/2014 FINDINGS: Lung bases are clear. Surgical absence of the gallbladder. No bile duct dilatation. Mild fatty infiltration of the liver. The pancreas, spleen, adrenal glands, kidneys, abdominal aorta, inferior vena cava, and retroperitoneal lymph nodes are unremarkable. Stomach is filled with contrast material. No gastric wall thickening. Small bowel and colon are not abnormally distended. Diffusely stool-filled colon. No free air or free fluid in the abdomen. Pelvis: Uterus and ovaries are not enlarged. Bladder is decompressed. Loculated fluid collection in the low pelvis adjacent to the bladder outlet, measuring 2.9 cm diameter. This was present previously without interval change. This could represent a urethral diverticulum or possibly a cystic structure such as Skene duct cyst. Small amount of free fluid in the pelvis may be physiologic. No destructive bone lesions. IMPRESSION: No acute process demonstrated in the abdomen or pelvis. Diffuse fatty infiltration of the liver. Cystic structure at the base of the urethra possibly representing urethral diverticulum or Skene duct cyst. No change since prior study. Electronically Signed   By: Lucienne Capers M.D.   On: 06/02/2015 00:58    Microbiology: Recent Results (from the past 240 hour(s))  MRSA PCR Screening     Status: Abnormal   Collection Time: 06/04/15  3:50 PM  Result Value Ref Range Status   MRSA by PCR POSITIVE (A) NEGATIVE Final    Comment:        The GeneXpert MRSA Assay (FDA approved for NASAL specimens only), is one component of a comprehensive MRSA colonization surveillance program. It is  not intended to diagnose MRSA infection nor to guide or monitor treatment for MRSA infections. RESULT CALLED TO, READ BACK BY AND VERIFIED WITH: D.THUCHMAN AT 1806 ON 06/04/15 BY S.VANHOORNE      Labs: Basic Metabolic Panel:  Recent Labs Lab 06/01/15 2204 06/03/15 2020 06/04/15 0515 06/06/15 0554  NA 135 137 139 139  K 3.4* 3.7 3.5 3.9  CL 97* 105 108 110  CO2  --  24 24 23   GLUCOSE 89 130* 104* 87  BUN 8 22* 15 <5*  CREATININE 0.60 0.59 0.39* 0.47  CALCIUM  --  8.5* 8.1* 7.9*   Liver Function Tests:  Recent Labs Lab 06/03/15 2020  AST 14*  ALT 11*  ALKPHOS 48  BILITOT 0.1*  PROT 6.5  ALBUMIN 3.3*    Recent Labs Lab 06/03/15 2020  LIPASE 30   No results for input(s): AMMONIA in the last 168 hours. CBC:  Recent Labs Lab 06/05/15 0312 06/05/15 1253 06/05/15 2117 06/06/15 0554 06/07/15 0330  WBC 8.4 8.9 10.6* 11.0* 11.1*  HGB 8.1* 8.4* 8.3* 8.0* 7.5*  HCT 24.1* 25.5* 25.5* 23.8* 22.6*  MCV 83.4 85.9 86.1 85.6 84.0  PLT 344 360 395 375 390   Cardiac Enzymes: No results for input(s): CKTOTAL, CKMB, CKMBINDEX, TROPONINI in the last 168 hours. BNP: BNP (last 3 results) No results for input(s): BNP in the last 8760 hours.  ProBNP (last 3 results) No results for input(s): PROBNP in the last 8760 hours.  CBG: No results for input(s): GLUCAP in the last 168 hours.     Signed:  Birdie Hopes MD.  [facility] 06/07/2015, 12:09 PM

## 2015-06-07 NOTE — Care Management Note (Signed)
Case Management Note  Patient Details  Name: [name] MRN: [id number] Date of Birth: 1976/01/05  Subjective/Objective:      40 yo admitted with GI bleed.              Action/Plan: From home with family  Expected Discharge Date:  06/07/15               Expected Discharge Plan:  Home/Self Care  In-House Referral:  NA  Discharge planning Services  CM Consult, [facility], [facility]  Post Acute Care Choice:    Choice offered to:  NA  DME Arranged:    DME Agency:     HH Arranged:    HH Agency:     Status of Service:  Completed, signed off  Medicare Important Message Given:    Date Medicare IM Given:    Medicare IM give by:    Date Additional Medicare IM Given:    Additional Medicare Important Message give by:     If discussed at [place] of Stay Meetings, dates discussed:    Additional Comments: This CM was notified by MD that pt to discharge on Pylera.  Pylera is a very expensive medication and pt has no insurance.  This CM called [facility] to confirm that they had medications equivalent to Pylera.  This was confirmed and MD to send scripts there.  Medications still several hundred dollars so pt was entered into the [facility] program and given the match letter.  Pt is without PCP and was given [facility] packet to follow up after discharge.  No other CM needs communicated at this time. Lynnell Catalan, RN 06/07/2015, 11:59 AM

## 2015-06-07 NOTE — Progress Notes (Signed)
     [facility] Progress Note  Subjective:  Feels good.  No further sign of bleeding.   Objective:  Vital signs in last 24 hours: Temp:  [98 F (36.7 C)-99.2 F (37.3 C)] 98 F (36.7 C) (03/29 0536) Pulse Rate:  [79-87] 79 (03/29 0536) Resp:  [16] 16 (03/29 0536) BP: (109-122)/(60-77) 122/77 mmHg (03/29 0536) SpO2:  [100 %] 100 % (03/29 0536) Weight:  [133 lb 1.6 oz (60.374 kg)] 133 lb 1.6 oz (60.374 kg) (03/28 1320) Last BM Date: 06/06/15 General:  Alert, Well-developed, in NAD Heart:  Regular rate and rhythm; no murmurs Pulm:  CTAB.  No W/R/R. Abdomen:  Soft, non-distended.  BS present.  Non-tender. Extremities:  Without edema. Neurologic:  Alert and oriented x 4;  grossly normal neurologically. Psych:  Alert and cooperative. Normal mood and affect.  Intake/Output from previous day: 03/28 0701 - 03/29 0700 In: 855 [P.O.:480; I.V.:375] Out: -   Lab Results:  Recent Labs  06/05/15 2117 06/06/15 0554 06/07/15 0330  WBC 10.6* 11.0* 11.1*  HGB 8.3* 8.0* 7.5*  HCT 25.5* 23.8* 22.6*  PLT 395 375 390   BMET  Recent Labs  06/06/15 0554  NA 139  K 3.9  CL 110  CO2 23  GLUCOSE 87  BUN <5*  CREATININE 0.47  CALCIUM 7.9*   Assessment / Plan: #1 40 yo female with acute GI bleed secondary to deep duodenal ulcer with visible vessel and duodenal deformity. Probably due to combination of NSAID's and Hpylori (Ab was positive).  No further sign of bleeding.   #2 Anemia - secondary to blood loss. Hgb down slightly today, but no sign of bleeding, so likely equilibration. #3 Substance abuse- suspect cocaine and heroin #4 Positive Hpylori Ab  *Ok for discharge today.  Needs repeat CBC next week. *Will need to begin Pylera treatment upon discharge for 14 days. May need social worker's help with getting the medication for this as she does not have insurance. *Also needs to continue on pantoprazole 40 mg BID for 6-8 weeks.   LOS: 4 days   ZEHR, JESSICA  D.  06/07/2015, 8:42 AM  Pager number [phone]    Attending physician's note   I have taken an interval history, reviewed the chart and examined the patient. I agree with the Advanced Practitioner's note, impression and recommendations. Patient c/o pain but no specific area. No further bleeding. Ok to discharge from GI perspective. Cont PPI BID and will need to complete treatment for H.pylori. Patient needs help for substance abuse and drug seeking behavior ?referral substance abuse clinic. Please call with any questions  K Denzil Magnuson, MD [phone] Mon-Fri 8a-5p [phone] after 5p, weekends, holidays

## 2016-05-26 ENCOUNTER — Emergency Department (HOSPITAL_COMMUNITY)
Admission: EM | Admit: 2016-05-26 | Discharge: 2016-05-27 | Disposition: A | Discharge status: Home / Self Care | Payer: Self-pay | Attending: Emergency Medicine | Admitting: Emergency Medicine

## 2016-05-26 ENCOUNTER — Emergency Department (HOSPITAL_COMMUNITY): Payer: Self-pay

## 2016-05-26 ENCOUNTER — Encounter (HOSPITAL_COMMUNITY): Payer: Self-pay | Admitting: Oncology

## 2016-05-26 DIAGNOSIS — F17210 Nicotine dependence, cigarettes, uncomplicated: Secondary | ICD-10-CM | POA: Insufficient documentation

## 2016-05-26 DIAGNOSIS — R0789 Other chest pain: Secondary | ICD-10-CM

## 2016-05-26 DIAGNOSIS — I10 Essential (primary) hypertension: Secondary | ICD-10-CM | POA: Insufficient documentation

## 2016-05-26 DIAGNOSIS — R1013 Epigastric pain: Secondary | ICD-10-CM

## 2016-05-26 LAB — CBC
HEMATOCRIT: 36 % (ref 36.0–46.0)
Hemoglobin: 11.7 g/dL — ABNORMAL LOW (ref 12.0–15.0)
MCH: 27.7 pg (ref 26.0–34.0)
MCHC: 32.5 g/dL (ref 30.0–36.0)
MCV: 85.3 fL (ref 78.0–100.0)
PLATELETS: 385 10*3/uL (ref 150–400)
RBC: 4.22 MIL/uL (ref 3.87–5.11)
RDW: 17.9 % — ABNORMAL HIGH (ref 11.5–15.5)
WBC: 10.8 10*3/uL — AB (ref 4.0–10.5)

## 2016-05-26 LAB — BASIC METABOLIC PANEL
Anion gap: 11 (ref 5–15)
BUN: 11 mg/dL (ref 6–20)
CO2: 24 mmol/L (ref 22–32)
Calcium: 9 mg/dL (ref 8.9–10.3)
Chloride: 101 mmol/L (ref 101–111)
Creatinine, Ser: 0.72 mg/dL (ref 0.44–1.00)
GFR calc Af Amer: >60 mL/min (ref >60)
GFR calc non Af Amer: >60 mL/min (ref >60)
GLUCOSE: 87 mg/dL (ref 65–99)
Potassium: 3.5 mmol/L (ref 3.5–5.1)
Sodium: 136 mmol/L (ref 135–145)

## 2016-05-26 LAB — I-STAT TROPONIN, ED: Troponin i, poc: 0 ng/mL (ref 0.00–0.08)

## 2016-05-26 LAB — LIPASE, BLOOD: LIPASE: 14 U/L (ref 11–51)

## 2016-05-26 MED ORDER — FENTANYL CITRATE (PF) 100 MCG/2ML IJ SOLN
50.0000 ug | Freq: Once | INTRAMUSCULAR | Status: AC
  Administered 2016-05-26: 50 ug via INTRAVENOUS
  Filled 2016-05-26: qty 2

## 2016-05-26 MED ORDER — GI COCKTAIL ~~LOC~~
30.0000 mL | Freq: Once | ORAL | Status: AC
  Administered 2016-05-26: 30 mL via ORAL
  Filled 2016-05-26: qty 30

## 2016-05-26 MED ORDER — ONDANSETRON HCL 4 MG/2ML IJ SOLN
4.0000 mg | Freq: Once | INTRAMUSCULAR | Status: AC
  Administered 2016-05-26: 4 mg via INTRAVENOUS
  Filled 2016-05-26: qty 2

## 2016-05-26 NOTE — ED Notes (Signed)
Patient transported to X-ray 

## 2016-05-26 NOTE — ED Provider Notes (Signed)
[facility] DEPT Provider Note   CSN: [id number] Arrival date & time: 05/26/16  2147     History   Chief Complaint Chief Complaint  Patient presents with  . Chest Pain    HPI [name] is a 41 y.o. female PMH chronic abdominal pain, duodenal ulcer, HTN, polysubstance abuse presents for several days worsening chest and abdominal pain. Pain is similar to before peptic ulcer was treated. She states she has not had pain since she was admitted for a bleeding duodenal ulcer one year ago. Also notes N/V for the past few days. Intermittent fever/chills and myalgias for two weeks. Denies cough, HA. Notes sores to arms/legs and face for several weeks.   The history is provided by the patient and medical records.  Chest Pain   This is a recurrent problem. The current episode started more than 2 days ago. The problem occurs constantly. The pain is associated with rest. The pain is present in the substernal region. The pain is at a severity of 10/10. The pain is severe. The quality of the pain is described as sharp and stabbing. The pain does not radiate. Associated symptoms include abdominal pain, diaphoresis, a fever, malaise/fatigue, nausea and vomiting. Pertinent negatives include no back pain, no claudication, no cough, no dizziness, no exertional chest pressure, no headaches, no hemoptysis, no leg pain, no lower extremity edema, no near-syncope, no numbness, no orthopnea, no palpitations, no PND, no shortness of breath, no sputum production, no syncope and no weakness. She has tried rest for the symptoms. The treatment provided no relief. Risk factors include smoking/tobacco exposure.  Her past medical history is significant for hypertension and stimulant use (cocaine).  Pertinent negatives for past medical history include no CAD, no COPD, no diabetes, no DVT, no PE, no seizures and no sickle cell disease.    Past Medical History:  Diagnosis Date  . Anemia 05/2015   due to blood loss  from duodenal ulcer.  transfused 3 PRBCs.   . Chronic abdominal pain 2008   ? IBS.  multiple CT scans: enlarged uterus.  . Depression    with mood swings  . Duodenal ulcer 05/2015  . Esophagitis 2011  . GERD (gastroesophageal reflux disease)   . Hypertension   . Obesity 2011  . Polysubstance abuse 09/28/2010   cocaine, opiates, THC, tobacco.       Patient Active Problem List   Diagnosis Date Noted  . Duodenal ulcer   . Acute blood loss anemia   . GI bleed 06/03/2015  . Symptomatic anemia 06/03/2015  . Polysubstance abuse 06/03/2015  . Thrombocytosis ([place]) 06/03/2015  . Chronic abdominal pain 06/03/2015  . Cocaine abuse 06/03/2015  . Upper GI bleed   . Obesity 10/24/2010  . GERD (gastroesophageal reflux disease) 09/28/2010  . Anxiety 09/28/2010  . Hypertension 09/28/2010  . Tobacco abuse 09/28/2010    Past Surgical History:  Procedure Laterality Date  . CHOLECYSTECTOMY  2004  . ESOPHAGOGASTRODUODENOSCOPY N/A 06/04/2015   Procedure: ESOPHAGOGASTRODUODENOSCOPY (EGD);  Surgeon: Ladene Artist, MD;  Location: [facility];  Service: Endoscopy;  Laterality: N/A;  . LAPAROSCOPIC APPENDECTOMY  11/2006   Dr Zella Richer  . TONSILLECTOMY    . TUBAL LIGATION      OB History    Gravida Para Term Preterm AB Living   0 0 0 0 0     SAB TAB Ectopic Multiple Live Births   0 0 0           Home Medications  Prior to Admission medications   Medication Sig Start Date End Date Taking? Authorizing Provider  Bismuth Subsalicylate 092 MG TABS Take 2 tablets (524 mg total) by mouth 4 (four) times daily. 06/07/15   Verlee Monte, MD  doxycycline (VIBRA-TABS) 100 MG tablet Take 1 tablet (100 mg total) by mouth 2 (two) times daily. 06/07/15   Verlee Monte, MD  metroNIDAZOLE (FLAGYL) 250 MG tablet Take 1 tablet (250 mg total) by mouth 4 (four) times daily. 06/07/15   Verlee Monte, MD  omeprazole (PRILOSEC) 40 MG capsule Take 1 capsule (40 mg total) by mouth 2 (two) times daily. 06/07/15   Verlee Monte, MD  traMADol (ULTRAM) 50 MG tablet Take 0.5 tablets (25 mg total) by mouth every 6 (six) hours as needed. 06/07/15   Verlee Monte, MD    Family History Family History  Problem Relation Age of Onset  . Hypertension Mother   . Heart disease Father 37    MI  . Stroke Father 70  . Diabetes Father   . GER disease Sister   . Cancer Maternal Grandmother     uterine and stomach    Social History Social History  Substance Use Topics  . Smoking status: Current Every Day Smoker    Packs/day: 0.30    Types: Cigarettes  . Smokeless tobacco: Never Used  . Alcohol use No     Comment: occ     Allergies   Hydrocodone   Review of Systems Review of Systems  Constitutional: Positive for chills, diaphoresis, fatigue, fever and malaise/fatigue. Negative for unexpected weight change.  HENT: Negative for congestion.   Respiratory: Negative for cough, hemoptysis, sputum production, chest tightness and shortness of breath.   Cardiovascular: Positive for chest pain. Negative for palpitations, orthopnea, claudication, leg swelling, syncope, PND and near-syncope.  Gastrointestinal: Positive for abdominal pain, diarrhea, nausea and vomiting. Negative for abdominal distention, anal bleeding and blood in stool.  Genitourinary: Negative for dysuria and flank pain.  Musculoskeletal: Positive for myalgias. Negative for back pain and neck pain.  Skin: Negative for rash.  Neurological: Negative for dizziness, seizures, speech difficulty, weakness, numbness and headaches.  All other systems reviewed and are negative.    Physical Exam Updated Vital Signs BP 114/85   Pulse 69   Temp 98.3 F (36.8 C) (Oral)   Resp (!) 23   LMP 05/09/2016 (Approximate)   SpO2 100%   Physical Exam  Constitutional: She is oriented to person, place, and time. She appears well-developed and well-nourished.  HENT:  Head: Normocephalic and atraumatic.  Mouth/Throat: Oropharynx is clear and moist.  Eyes:  Conjunctivae and EOM are normal.  Neck: Normal range of motion. Neck supple.  Cardiovascular: Normal rate, regular rhythm and intact distal pulses.   No murmur heard. Pulmonary/Chest: Effort normal and breath sounds normal. No respiratory distress.  Abdominal: Soft. She exhibits no distension and no ascites. There is tenderness in the right upper quadrant and epigastric area. There is guarding (voluntary). There is no rigidity and no rebound.  Musculoskeletal: Normal range of motion. She exhibits no edema.  Neurological: She is alert and oriented to person, place, and time.  Skin: Skin is warm and dry. Capillary refill takes less than 2 seconds.  Scattered well healing superficial wounds, no cellulitis or abscess  Nursing note and vitals reviewed.    ED Treatments / Results  Labs (all labs ordered are listed, but only abnormal results are displayed) Labs Reviewed  CBC - Abnormal; Notable for the following:  Result Value   WBC 10.8 (*)    Hemoglobin 11.7 (*)    RDW 17.9 (*)    All other components within normal limits  BASIC METABOLIC PANEL  LIPASE, BLOOD  URINALYSIS, ROUTINE W REFLEX MICROSCOPIC  RAPID HIV SCREEN (HIV 1/2 AB+AG)  I-STAT TROPOININ, ED  POC URINE PREG, ED    EKG  EKG Interpretation  Date/Time:  Sunday May 26 2016 21:59:29 EDT Ventricular Rate:  70 PR Interval:    QRS Duration: 76 QT Interval:  381 QTC Calculation: 412 R Axis:   24 Text Interpretation:  Sinus rhythm Abnormal R-wave progression, early transition Minimal ST elevation, anterior leads No acute changes No significant change since last tracing Confirmed by Kathrynn Humble, MD, Thelma Comp [phone]) on 05/27/2016 12:50:12 AM       Radiology Dg Chest 2 View  Result Date: 05/26/2016 CLINICAL DATA:  Per EMS pt c/o left sided CP since this am. +N/V/D. Pt also c/o abd pain. EXAM: CHEST  2 VIEW COMPARISON:  06/01/2015 FINDINGS: Normal mediastinum and cardiac silhouette. Normal pulmonary vasculature. No  evidence of effusion, infiltrate, or pneumothorax. No acute bony abnormality. IMPRESSION: No acute cardiopulmonary process. Electronically Signed   By: Suzy Bouchard M.D.   On: 05/26/2016 23:08    Procedures Procedures (including critical care time)  Medications Ordered in ED Medications  ondansetron (ZOFRAN) injection 4 mg (4 mg Intravenous Given 05/26/16 2257)  fentaNYL (SUBLIMAZE) injection 50 mcg (50 mcg Intravenous Given 05/26/16 2258)  gi cocktail (Maalox,Lidocaine,Donnatal) (30 mLs Oral Given 05/26/16 2257)     Initial Impression / Assessment and Plan / ED Course  I have reviewed the triage vital signs and the nursing notes.  Pertinent labs & imaging results that were available during my care of the patient were reviewed by me and considered in my medical decision making (see chart for details).    41 y.o. female presents for atypical chest pain and recurrent abdominal pain. Doubt ACS. Possible recurrence of ulcer. VSS, NAD, well hydrated and well appearing.  - EKG NSR, no ischemic changes - CXR unremarkable - BMP, CBC, Trop, lipase unremarkable. Urine pregnancy negative -  On reassessment, sleeping comfortably. UA and HIV Ab in process. If negative can discharge home to follow-up with PCP.  Discussed with my attending physician, Dr Reather Converse  Final Clinical Impressions(s) / ED Diagnoses   Final diagnoses:  Atypical chest pain  Epigastric pain    New Prescriptions New Prescriptions   No medications on file     Monico Blitz, MD 05/27/16 2536    Elnora Morrison, MD 05/29/16 1123

## 2016-05-26 NOTE — ED Triage Notes (Signed)
Per EMS pt c/o left sided CP since this am.  +N/V/D.  Pt also c/o abd pain.

## 2016-05-27 LAB — URINALYSIS, ROUTINE W REFLEX MICROSCOPIC
Bilirubin Urine: NEGATIVE
Glucose, UA: NEGATIVE mg/dL
Hgb urine dipstick: NEGATIVE
Ketones, ur: 5 mg/dL — AB
Nitrite: NEGATIVE
PROTEIN: 30 mg/dL — AB
Specific Gravity, Urine: 1.034 — ABNORMAL HIGH (ref 1.005–1.030)
pH: 6 (ref 5.0–8.0)

## 2016-05-27 LAB — POC URINE PREG, ED: PREG TEST UR: NEGATIVE

## 2016-05-27 LAB — RAPID HIV SCREEN (HIV 1/2 AB+AG)
HIV 1/2 ANTIBODIES: NONREACTIVE
HIV-1 P24 ANTIGEN - HIV24: NONREACTIVE

## 2016-05-27 NOTE — Discharge Instructions (Addendum)
We saw you in the ER for All the results in the ER are normal, labs and imaging. We are not sure what is causing your symptoms. The workup in the ER is not complete, and is limited to screening for life threatening and emergent conditions only, so please see a primary care doctor for further evaluation.

## 2017-05-02 IMAGING — CT CT ABD-PELV W/ CM
2 of 5 series · 16 of 46 positions shown, 18 images · IV contrast (APPLIED)
Comparison: None.

CLINICAL DATA: Severe epigastric pain for 2 days.

EXAM:
CT ABDOMEN AND PELVIS WITH CONTRAST
TECHNIQUE: Multidetector CT imaging of the abdomen and pelvis was performed
using the standard protocol following bolus administration of
intravenous contrast.
CONTRAST:  100mL OMNIPAQUE IOHEXOL 300 MG/ML  SOLN

[Series 2: abd/ pelvis 5.0 i30f 1 · axial · 0.61mm/px · z∈[+876,+1226]mm · 13 of 80 slices shown, 15 images]
[im 5/80  soft-tissue]
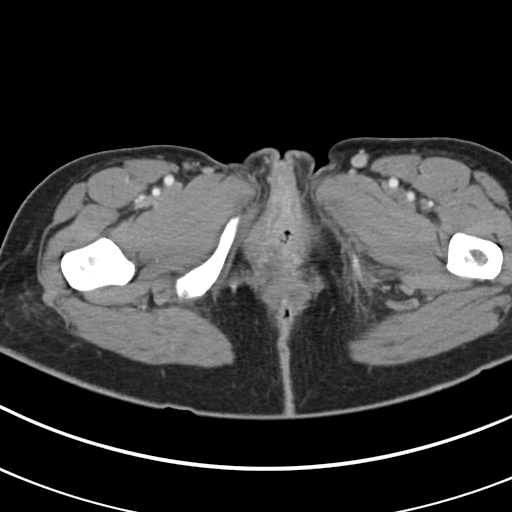
[im 5/80  bone]
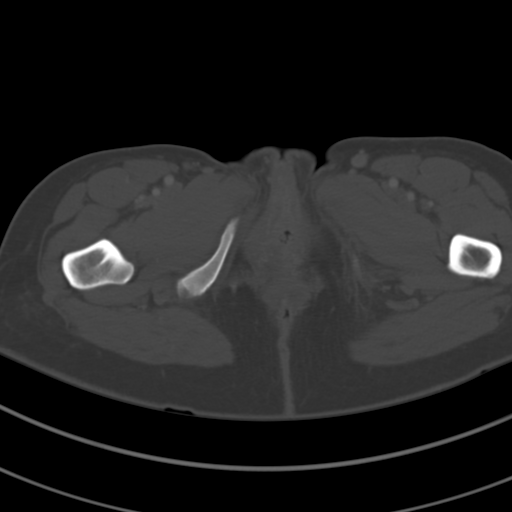
[im 9/80  soft-tissue]
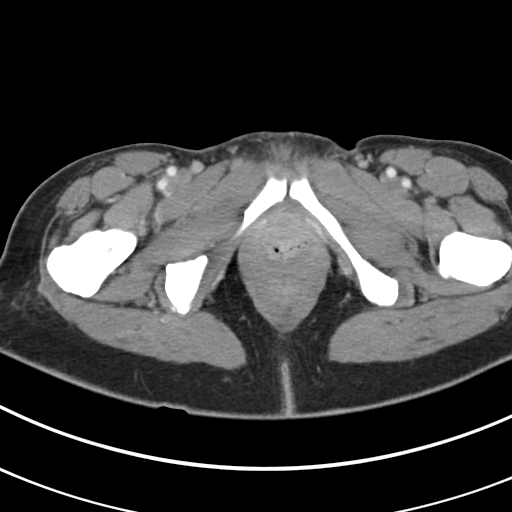
[im 18/80  soft-tissue]
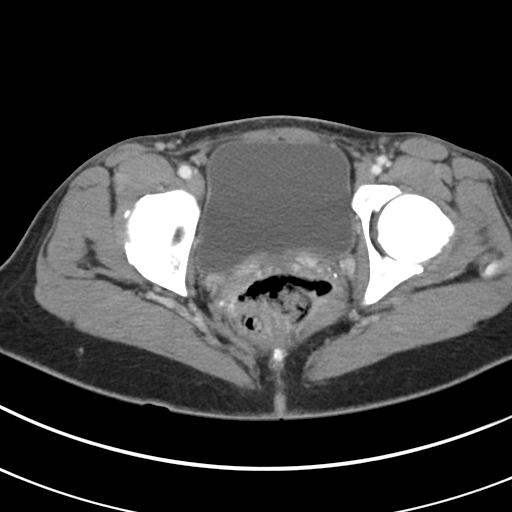
[im 22/80  soft-tissue]
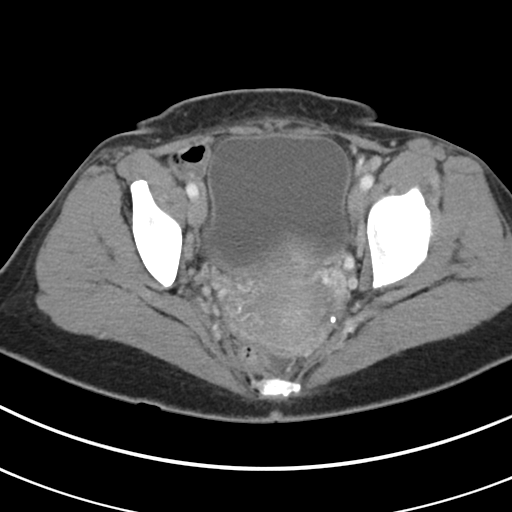
[im 27/80  soft-tissue]
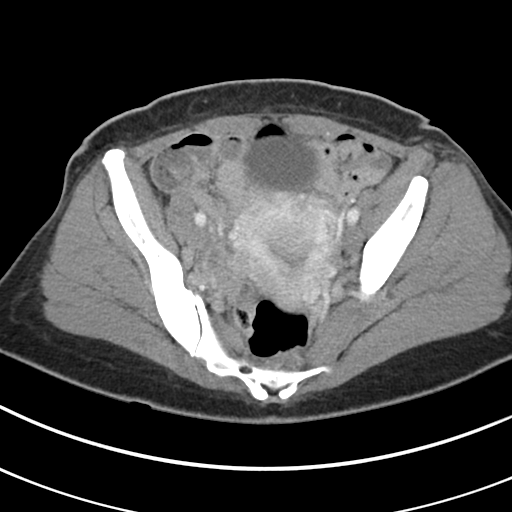
[im 36/80  soft-tissue]
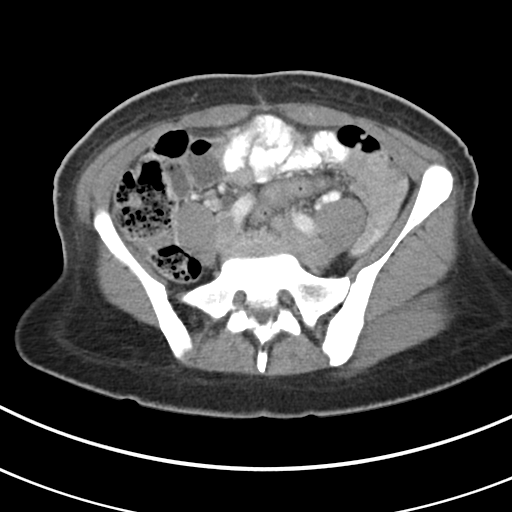
[im 40/80  soft-tissue]
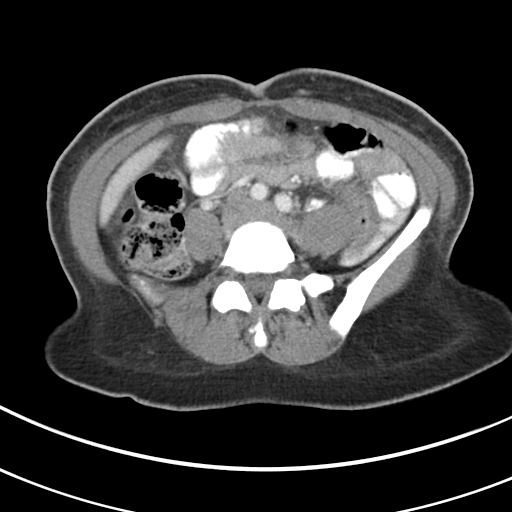
[im 44/80  soft-tissue]
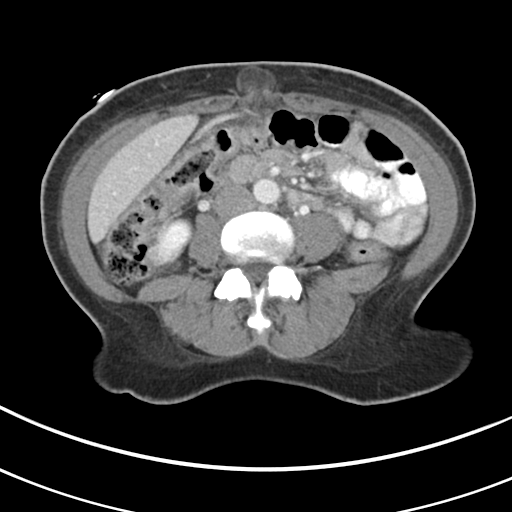
[im 53/80  soft-tissue]
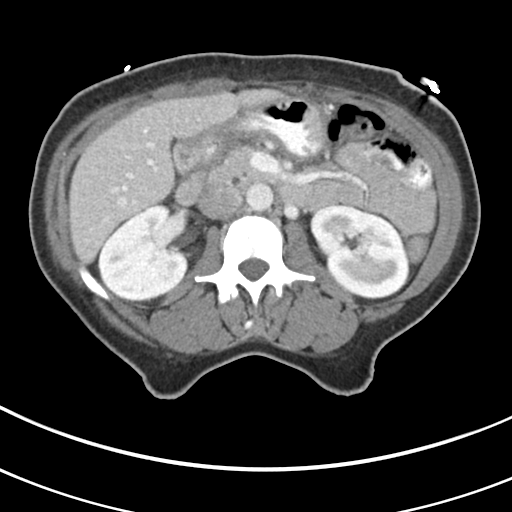
[im 53/80  bone]
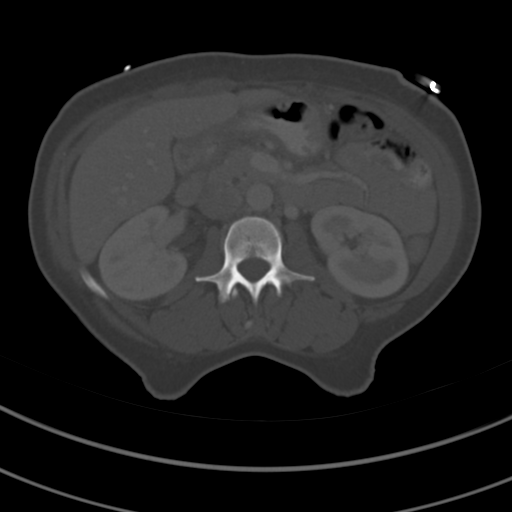
[im 58/80  soft-tissue]
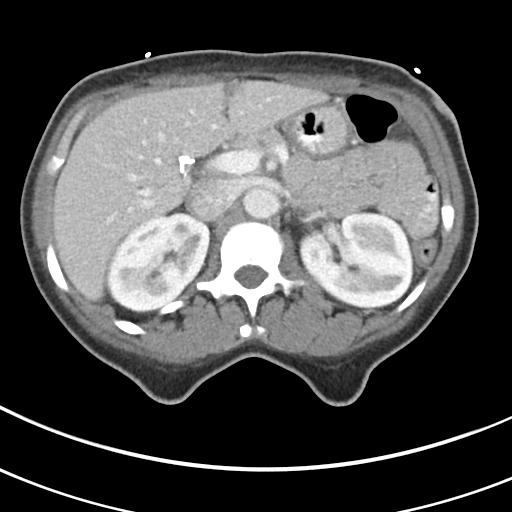
[im 62/80  soft-tissue]
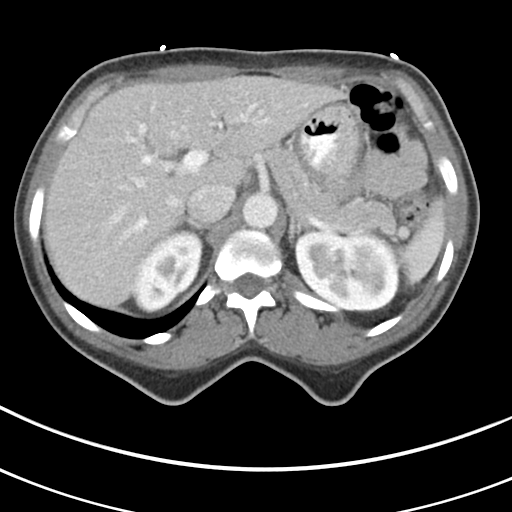
[im 71/80  soft-tissue]
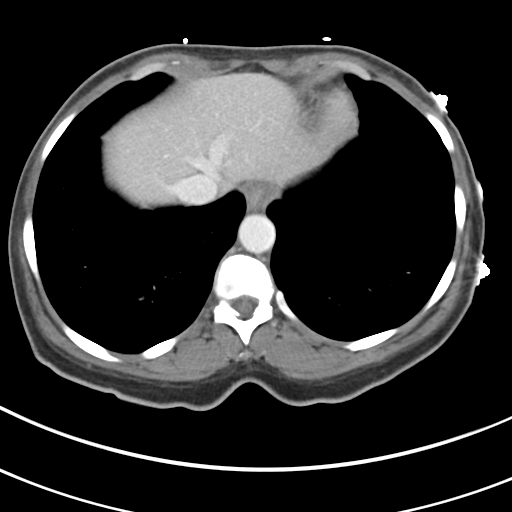
[im 75/80  soft-tissue]
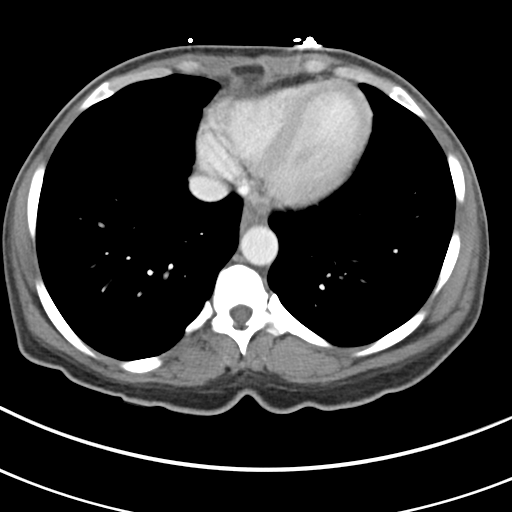

[Series 5: coronal soft tissue · coronal · 0.68mm/px · 3 of 82 slices shown]
[im 28/82  soft-tissue]
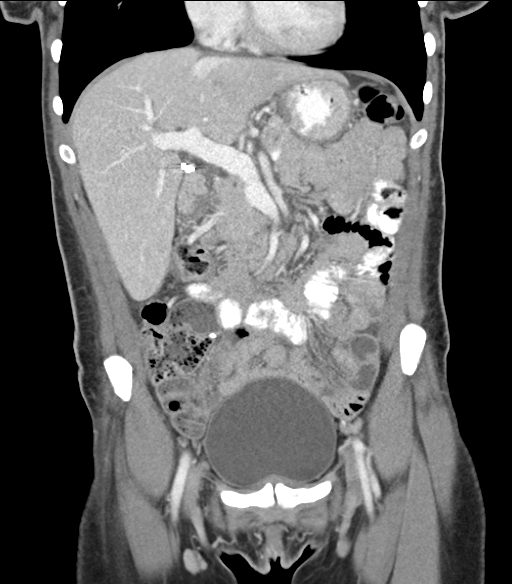
[im 37/82  soft-tissue]
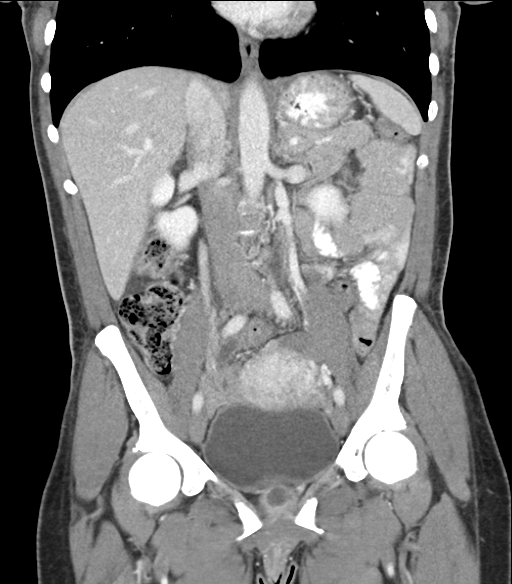
[im 46/82  soft-tissue]
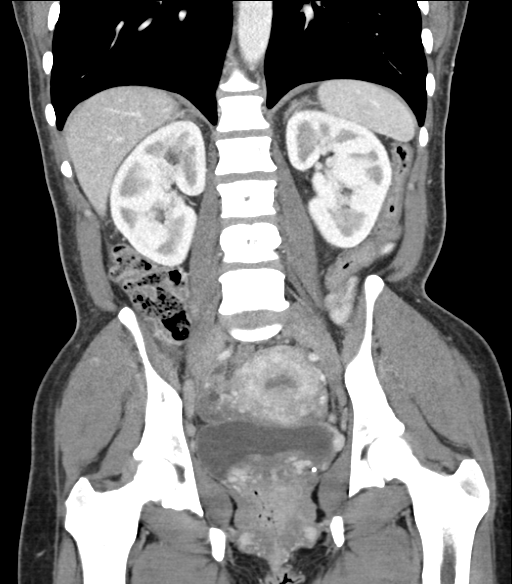

[16 of 46 positions shown; findings below may reference images not displayed]

FINDINGS: Lower chest:  Unremarkable.

Hepatobiliary: No masses or other significant abnormality
identified. There has been a prior cholecystectomy.

Pancreas: No evidence of mass, inflammatory changes, or other
significant abnormality.

Spleen:  Within normal limits in size and appearance.

Adrenal Glands:  No masses identified.

Kidneys/Urinary Tract: No evidence of urolithiasis or
hydronephrosis. No solid or complex cystic renal masses identified.
No masses or calculi seen involving the lower urinary tract.

Stomach/Bowel/Peritoneum: No evidence of wall thickening, mass, or
obstruction.

Vascular/Lymphatic: No pathologically enlarged lymph nodes
identified. No other significant abnormality noted.

Reproductive: The endometrial canal is fluid-filled. Higher density
fluid is also seen within the vagina. No evidence of adnexal masses.
Left ovary is somewhat unusually positioned high within the pelvis.

Other: There is a 1.7 by 2.4 by 2.3 cm narrow neck fat containing
supraumbilical mid anterior abdominal wall hernia with evidence of
fat stranding within the enclosed fat.

Musculoskeletal:  No suspicious bone lesions identified.
IMPRESSION: Normal appearance of solid abdominal organs.

Normal appearance of the bowel.

2.4 cm fat containing narrow neck supraumbilical anterior abdominal
wall hernia, with possible strangulation of the entrapped fat.

Fluid within the endometrial canal and vagina. Please correlate
clinically.

Somewhat unusual location of the left ovary high within the pelvis.

This may be an anatomic variant, however pelvic ultrasound may be
considered if there is any clinical concern of gynecological
abnormality.

## 2017-07-12 ENCOUNTER — Emergency Department (HOSPITAL_COMMUNITY)
Admission: EM | Admit: 2017-07-12 | Discharge: 2017-07-13 | Disposition: A | Discharge status: Home / Self Care | Payer: Self-pay | Attending: Emergency Medicine | Admitting: Emergency Medicine

## 2017-07-12 ENCOUNTER — Emergency Department (HOSPITAL_COMMUNITY): Payer: Self-pay

## 2017-07-12 ENCOUNTER — Encounter (HOSPITAL_COMMUNITY): Payer: Self-pay

## 2017-07-12 ENCOUNTER — Other Ambulatory Visit: Payer: Self-pay

## 2017-07-12 DIAGNOSIS — Z9049 Acquired absence of other specified parts of digestive tract: Secondary | ICD-10-CM | POA: Insufficient documentation

## 2017-07-12 DIAGNOSIS — Z720 Tobacco use: Secondary | ICD-10-CM

## 2017-07-12 DIAGNOSIS — F329 Major depressive disorder, single episode, unspecified: Secondary | ICD-10-CM | POA: Insufficient documentation

## 2017-07-12 DIAGNOSIS — I4581 Long QT syndrome: Secondary | ICD-10-CM | POA: Insufficient documentation

## 2017-07-12 DIAGNOSIS — R1010 Upper abdominal pain, unspecified: Secondary | ICD-10-CM | POA: Insufficient documentation

## 2017-07-12 DIAGNOSIS — R112 Nausea with vomiting, unspecified: Secondary | ICD-10-CM

## 2017-07-12 DIAGNOSIS — I10 Essential (primary) hypertension: Secondary | ICD-10-CM | POA: Insufficient documentation

## 2017-07-12 DIAGNOSIS — F419 Anxiety disorder, unspecified: Secondary | ICD-10-CM | POA: Insufficient documentation

## 2017-07-12 DIAGNOSIS — K920 Hematemesis: Secondary | ICD-10-CM | POA: Insufficient documentation

## 2017-07-12 DIAGNOSIS — F1410 Cocaine abuse, uncomplicated: Secondary | ICD-10-CM | POA: Insufficient documentation

## 2017-07-12 DIAGNOSIS — R8281 Pyuria: Secondary | ICD-10-CM

## 2017-07-12 DIAGNOSIS — D696 Thrombocytopenia, unspecified: Secondary | ICD-10-CM | POA: Insufficient documentation

## 2017-07-12 DIAGNOSIS — D649 Anemia, unspecified: Secondary | ICD-10-CM | POA: Insufficient documentation

## 2017-07-12 DIAGNOSIS — D473 Essential (hemorrhagic) thrombocythemia: Secondary | ICD-10-CM

## 2017-07-12 DIAGNOSIS — R9431 Abnormal electrocardiogram [ECG] [EKG]: Secondary | ICD-10-CM

## 2017-07-12 DIAGNOSIS — F1290 Cannabis use, unspecified, uncomplicated: Secondary | ICD-10-CM | POA: Insufficient documentation

## 2017-07-12 DIAGNOSIS — Z79899 Other long term (current) drug therapy: Secondary | ICD-10-CM | POA: Insufficient documentation

## 2017-07-12 DIAGNOSIS — R0789 Other chest pain: Secondary | ICD-10-CM | POA: Insufficient documentation

## 2017-07-12 DIAGNOSIS — F17210 Nicotine dependence, cigarettes, uncomplicated: Secondary | ICD-10-CM | POA: Insufficient documentation

## 2017-07-12 DIAGNOSIS — D75839 Thrombocytosis, unspecified: Secondary | ICD-10-CM

## 2017-07-12 LAB — COMPREHENSIVE METABOLIC PANEL
ALBUMIN: 3.6 g/dL (ref 3.5–5.0)
ALT: 13 U/L — AB (ref 14–54)
AST: 17 U/L (ref 15–41)
Alkaline Phosphatase: 56 U/L (ref 38–126)
Anion gap: 8 (ref 5–15)
BUN: 10 mg/dL (ref 6–20)
CALCIUM: 9.3 mg/dL (ref 8.9–10.3)
CHLORIDE: 106 mmol/L (ref 101–111)
CO2: 26 mmol/L (ref 22–32)
Creatinine, Ser: 0.61 mg/dL (ref 0.44–1.00)
GFR calc non Af Amer: >60 mL/min (ref >60)
GLUCOSE: 92 mg/dL (ref 65–99)
Potassium: 3.5 mmol/L (ref 3.5–5.1)
Sodium: 140 mmol/L (ref 135–145)
Total Bilirubin: 0.6 mg/dL (ref 0.3–1.2)
Total Protein: 7.4 g/dL (ref 6.5–8.1)

## 2017-07-12 LAB — CBC WITH DIFFERENTIAL/PLATELET
BASOS ABS: 0 10*3/uL (ref 0.0–0.1)
Basophils Relative: 0 %
Eosinophils Absolute: 0 10*3/uL (ref 0.0–0.7)
Eosinophils Relative: 1 %
HCT: 33.7 % — ABNORMAL LOW (ref 36.0–46.0)
HEMOGLOBIN: 10.9 g/dL — AB (ref 12.0–15.0)
LYMPHS ABS: 1.8 10*3/uL (ref 0.7–4.0)
LYMPHS PCT: 22 %
MCH: 28.1 pg (ref 26.0–34.0)
MCHC: 32.3 g/dL (ref 30.0–36.0)
MCV: 86.9 fL (ref 78.0–100.0)
Monocytes Absolute: 0.4 10*3/uL (ref 0.1–1.0)
Monocytes Relative: 4 %
NEUTROS ABS: 6.1 10*3/uL (ref 1.7–7.7)
NEUTROS PCT: 73 %
Platelets: 515 10*3/uL — ABNORMAL HIGH (ref 150–400)
RBC: 3.88 MIL/uL (ref 3.87–5.11)
RDW: 16.3 % — ABNORMAL HIGH (ref 11.5–15.5)
WBC: 8.3 10*3/uL (ref 4.0–10.5)

## 2017-07-12 LAB — I-STAT TROPONIN, ED: Troponin i, poc: 0 ng/mL (ref 0.00–0.08)

## 2017-07-12 LAB — I-STAT BETA HCG BLOOD, ED (MC, WL, AP ONLY): I-stat hCG, quantitative: <5 m[IU]/mL (ref <5)

## 2017-07-12 LAB — TYPE AND SCREEN
ABO/RH(D): O NEG
Antibody Screen: NEGATIVE

## 2017-07-12 LAB — LIPASE, BLOOD: LIPASE: 28 U/L (ref 11–51)

## 2017-07-12 MED ORDER — PROMETHAZINE HCL 25 MG/ML IJ SOLN: 25.0000 mg | Freq: Once | INTRAMUSCULAR | Status: DC

## 2017-07-12 MED ORDER — PANTOPRAZOLE SODIUM 40 MG IV SOLR
40.0000 mg | Freq: Once | INTRAVENOUS | Status: AC
Start: 2017-07-12 — End: 2017-07-12
  Administered 2017-07-12: 40 mg via INTRAVENOUS
  Filled 2017-07-12: qty 40

## 2017-07-12 MED ORDER — GI COCKTAIL ~~LOC~~
30.0000 mL | Freq: Once | ORAL | Status: AC
  Administered 2017-07-12: 30 mL via ORAL
  Filled 2017-07-12: qty 30

## 2017-07-12 MED ORDER — PROMETHAZINE HCL 25 MG/ML IJ SOLN
12.5000 mg | Freq: Once | INTRAMUSCULAR | Status: AC
  Administered 2017-07-12: 12.5 mg via INTRAVENOUS
  Filled 2017-07-12: qty 1

## 2017-07-12 MED ORDER — ONDANSETRON HCL 4 MG/2ML IJ SOLN
4.0000 mg | Freq: Once | INTRAMUSCULAR | Status: DC
  Filled 2017-07-12: qty 2

## 2017-07-12 MED ORDER — SODIUM CHLORIDE 0.9 % IV SOLN
INTRAVENOUS | Status: DC
  Administered 2017-07-12: 22:00:00 via INTRAVENOUS

## 2017-07-12 MED ORDER — SODIUM CHLORIDE 0.9 % IV BOLUS
1000.0000 mL | Freq: Once | INTRAVENOUS | Status: AC
  Administered 2017-07-12: 1000 mL via INTRAVENOUS

## 2017-07-12 NOTE — ED Provider Notes (Signed)
[facility] DEPT Provider Note   CSN: [id number] Arrival date & time: 07/12/17  1906     History   Chief Complaint Chief Complaint  Patient presents with  . Emesis  . Abdominal Pain    HPI [name] is a 42 y.o. female with a PMHx of chronic abdominal pain, anemia from GI bleed from duodenal ulcer in 2017 requiring transfusion, GERD, HTN, polysubstance abuse, and other conditions listed below, and a PSHx of cholecystectomy and appendectomy, who presents to the ED with complaints of 1 week of upper abdominal pain, nausea, and vomiting.  She describes her pain as 10/10 constant aching and stabbing upper abdominal pain that radiates into her flank area bilaterally, worse with movement, and unrelieved with Tylenol.  She reports associated nausea and 4-5 episodes daily of emesis containing dark red blood without clots or coffee-ground appearance.  She states that she had a low-grade temp yesterday of 100.2 but no fevers greater than 100.4.  She also reports intermittent left-sided chest pain underneath her left breast.  She does not have a PCP or a GI specialist that she sees.  LMP was 07/07/2017 until yesterday.  She admits to being a cigarette smoker, as well as being a marijuana user, with the last use being 2 days ago.  Positive family history of MI in her father at 40 years old.  She denies fevers >100.4, chills, SOB, cough, diarrhea/constipation, obstipation, melena, hematochezia, hematuria, dysuria, vaginal bleeding/discharge, myalgias, arthralgias, numbness, tingling, focal weakness, or any other complaints at this time. Denies recent travel, sick contacts, suspicious food intake, EtOH use, or NSAID use.   The history is provided by the patient and medical records. No language interpreter was used.  Emesis   Associated symptoms include abdominal pain. Pertinent negatives include no arthralgias, no chills, no cough, no diarrhea, no fever (nothing >100.4  but reports temp 100.2 yesterday) and no myalgias.  Abdominal Pain   Associated symptoms include nausea and vomiting. Pertinent negatives include fever (nothing >100.4 but reports temp 100.2 yesterday), diarrhea, constipation, dysuria, hematuria, arthralgias and myalgias.    Past Medical History:  Diagnosis Date  . Anemia 05/2015   due to blood loss from duodenal ulcer.  transfused 3 PRBCs.   . Chronic abdominal pain 2008   ? IBS.  multiple CT scans: enlarged uterus.  . Depression    with mood swings  . Duodenal ulcer 05/2015  . Esophagitis 2011  . GERD (gastroesophageal reflux disease)   . Hypertension   . Obesity 2011  . Polysubstance abuse ([place]) 09/28/2010   cocaine, opiates, THC, tobacco.       Patient Active Problem List   Diagnosis Date Noted  . Duodenal ulcer   . Acute blood loss anemia   . GI bleed 06/03/2015  . Symptomatic anemia 06/03/2015  . Polysubstance abuse ([place]) 06/03/2015  . Thrombocytosis ([place]) 06/03/2015  . Chronic abdominal pain 06/03/2015  . Cocaine abuse ([place]) 06/03/2015  . Upper GI bleed   . Obesity 10/24/2010  . GERD (gastroesophageal reflux disease) 09/28/2010  . Anxiety 09/28/2010  . Hypertension 09/28/2010  . Tobacco abuse 09/28/2010    Past Surgical History:  Procedure Laterality Date  . CHOLECYSTECTOMY  2004  . ESOPHAGOGASTRODUODENOSCOPY N/A 06/04/2015   Procedure: ESOPHAGOGASTRODUODENOSCOPY (EGD);  Surgeon: Ladene Artist, MD;  Location: [facility];  Service: Endoscopy;  Laterality: N/A;  . LAPAROSCOPIC APPENDECTOMY  11/2006   Dr Zella Richer  . TONSILLECTOMY    . TUBAL LIGATION  OB History    Gravida  0   Para  0   Term  0   Preterm  0   AB  0   Living        SAB  0   TAB  0   Ectopic  0   Multiple      Live Births               Home Medications    Prior to Admission medications   Medication Sig Start Date End Date Taking? Authorizing Provider  Bismuth Subsalicylate 921 MG TABS Take 2 tablets (524 mg  total) by mouth 4 (four) times daily. 06/07/15   Verlee Monte, MD  doxycycline (VIBRA-TABS) 100 MG tablet Take 1 tablet (100 mg total) by mouth 2 (two) times daily. 06/07/15   Verlee Monte, MD  metroNIDAZOLE (FLAGYL) 250 MG tablet Take 1 tablet (250 mg total) by mouth 4 (four) times daily. 06/07/15   Verlee Monte, MD  omeprazole (PRILOSEC) 40 MG capsule Take 1 capsule (40 mg total) by mouth 2 (two) times daily. 06/07/15   Verlee Monte, MD  traMADol (ULTRAM) 50 MG tablet Take 0.5 tablets (25 mg total) by mouth every 6 (six) hours as needed. 06/07/15   Verlee Monte, MD    Family History Family History  Problem Relation Age of Onset  . Hypertension Mother   . Heart disease Father 27       MI  . Stroke Father 9  . Diabetes Father   . GER disease Sister   . Cancer Maternal Grandmother        uterine and stomach    Social History Social History   Tobacco Use  . Smoking status: Current Every Day Smoker    Packs/day: 0.30    Types: Cigarettes  . Smokeless tobacco: Never Used  Substance Use Topics  . Alcohol use: No    Comment: occ  . Drug use: No    Types: Cocaine, Marijuana    Comment: abstinent for 1 year     Allergies   Hydrocodone   Review of Systems Review of Systems  Constitutional: Negative for chills and fever (nothing >100.4 but reports temp 100.2 yesterday).  Respiratory: Negative for cough and shortness of breath.   Cardiovascular: Positive for chest pain.  Gastrointestinal: Positive for abdominal pain, nausea and vomiting. Negative for blood in stool, constipation and diarrhea.  Genitourinary: Negative for dysuria, hematuria, vaginal bleeding and vaginal discharge.  Musculoskeletal: Negative for arthralgias and myalgias.  Skin: Negative for color change.  Allergic/Immunologic: Negative for immunocompromised state.  Neurological: Negative for weakness and numbness.  Psychiatric/Behavioral: Negative for confusion.   All other systems reviewed and are negative  for acute change except as noted in the HPI.    Physical Exam Updated Vital Signs BP (!) 156/100   Pulse 75   Temp 98.3 F (36.8 C) (Oral)   Resp 16   SpO2 100%   Physical Exam  Constitutional: She is oriented to person, place, and time. Vital signs are normal. She appears well-developed and well-nourished. She is sleeping. She is easily aroused.  Non-toxic appearance. No distress.  Afebrile, nontoxic, NAD, falls asleep multiple times during evaluation but is easily aroused, gets upset that I'm asking her questions and disturbing her rest.  HENT:  Head: Normocephalic and atraumatic.  Mouth/Throat: Oropharynx is clear and moist and mucous membranes are normal.  Eyes: Conjunctivae and EOM are normal. Right eye exhibits no discharge. Left eye exhibits no discharge.  Neck: Normal range of motion. Neck supple.  Cardiovascular: Normal rate, regular rhythm, normal heart sounds and intact distal pulses. Exam reveals no gallop and no friction rub.  No murmur heard. RRR, nl s1/s2, no m/r/g, distal pulses intact, no pedal edema   Pulmonary/Chest: Effort normal and breath sounds normal. No respiratory distress. She has no decreased breath sounds. She has no wheezes. She has no rhonchi. She has no rales. She exhibits tenderness. She exhibits no crepitus, no deformity and no retraction.  CTAB in all lung fields, no w/r/r, no hypoxia or increased WOB, speaking in full sentences, SpO2 100% on RA Chest wall with mild L sided anterior TTP although she complains of tenderness essentially anywhere she's touched with the stethoscope or during palpation yet she doesn't really seem to be that uncomfortable; chest wall  without crepitus, deformities, or retractions   Abdominal: Soft. Normal appearance and bowel sounds are normal. She exhibits no distension. There is generalized tenderness. There is no rigidity, no rebound, no guarding, no CVA tenderness, no tenderness at McBurney's point and negative Murphy's sign.   Soft, nondistended, +BS throughout, complaining of diffuse TTP essentially anywhere she's touched with the stethoscope or palpated, although she doesn't look that uncomfortable and she keeps her eyes closed during the entire evaluation; the tenderness seems to be more in the upper abdomen rather than the lower abdomen but again she doesn't really look that uncomfortable during any of the evaluation; no r/g/r, neg murphy's, neg mcburney's, no CVA TTP   Musculoskeletal: Normal range of motion.  MAE x4 Strength and sensation grossly intact in all extremities Distal pulses intact No pedal edema, neg homan's bilaterally   Neurological: She is oriented to person, place, and time and easily aroused. She has normal strength. No sensory deficit.  Somnolent but easily arousable; oriented x4  Skin: Skin is warm, dry and intact. No rash noted.  Psychiatric: She has a normal mood and affect.  Nursing note and vitals reviewed.    ED Treatments / Results  Labs (all labs ordered are listed, but only abnormal results are displayed) Labs Reviewed  COMPREHENSIVE METABOLIC PANEL - Abnormal; Notable for the following components:      Result Value   ALT 13 (*)    All other components within normal limits  CBC WITH DIFFERENTIAL/PLATELET - Abnormal; Notable for the following components:   Hemoglobin 10.9 (*)    HCT 33.7 (*)    RDW 16.3 (*)    Platelets 515 (*)    All other components within normal limits  LIPASE, BLOOD  RAPID URINE DRUG SCREEN, HOSP PERFORMED  URINALYSIS, ROUTINE W REFLEX MICROSCOPIC  I-STAT BETA HCG BLOOD, ED ([facility], [facility], [facility] ONLY)  I-STAT TROPONIN, ED  TYPE AND SCREEN    EKG EKG Interpretation  Date/Time:  Saturday Jul 12 2017 21:07:04 EDT Ventricular Rate:  71 PR Interval:    QRS Duration: 56 QT Interval:  470 QTC Calculation: 511 R Axis:   30 Text Interpretation:  Sinus rhythm Borderline T wave abnormalities Prolonged QT interval Confirmed by Fredia Sorrow [phone]) on  07/12/2017 10:34:48 PM   Radiology No results found.  Procedures Procedures (including critical care time)  Medications Ordered in ED Medications  sodium chloride 0.9 % bolus 1,000 mL (1,000 mLs Intravenous New Bag/Given 07/12/17 2240)    And  0.9 %  sodium chloride infusion (has no administration in time range)  pantoprazole (PROTONIX) injection 40 mg (40 mg Intravenous Given 07/12/17 2240)  gi cocktail (Maalox,Lidocaine,Donnatal) (30 mLs Oral  Given 07/12/17 2239)  promethazine (PHENERGAN) injection 12.5 mg (12.5 mg Intravenous Given 07/12/17 2239)     Initial Impression / Assessment and Plan / ED Course  I have reviewed the triage vital signs and the nursing notes.  Pertinent labs & imaging results that were available during my care of the patient were reviewed by me and considered in my medical decision making (see chart for details).     42 y.o. female here with 1wk of gradually worsening upper abd pain and n/v, reports hematemesis, states she required blood transfusion in 2017 for similar issue which was from gastric ulcers. On exam, pt is very somnolent and repeatedly has to be asked to wake up, however she is easily arousable and gets mad that I'm asking her questions and disturbing her; then she complains of pain anywhere I touch her on her chest or abdomen or back, no matter whether it's with a stethoscope or while palpating, but she keeps her eyes closed the whole time and doesn't really appear to be that uncomfortable; and then she immediately asks for something for pain, despite sleeping through most of the evaluation. She is tender in her left chest and upper abdomen, but nonperitoneal; clear lungs; VSS. Difficult to really assess what might be going on, but will get labs, EKG, acute abd series, and give GI cocktail, zofran, protonix, and fluids then reassess shortly.   10:20 PM Labs just now being obtained due to nursing staff delays. Meds having yet been given, xray hasn't yet been  done. EKG nonischemic but with prolonged QT therefore will switch zofran to phenergan (pt still sleeping comfortably on recheck so will give low dose for now). Will reassess shortly.   11:12 PM CBC w/diff with mild anemia, Hgb 10.9 (looking back, she's had a wide range of Hgb values, but usually average somewhere between 10.0-11.9 or so, with occasional outliers higher and lower than that), most recent Hgb 11.7 on 05/26/16 but we don't have any other values more recently than that; plt count also elevated at 515 which appears to be chronic based on prior values over the years. This CBC was drawn about 3hrs after she arrived here, and she hasn't had any further emesis here, so I doubt that her Hgb will drop any further at this point, if she was having hematemesis earlier. Trop neg. BetaHCG neg. CMP WNL. Lipase WNL. Remainder of work up pending. Pt still somnolent, but arousable, states nausea better but reports ongoing pain, however immediately falls back asleep after that and doesn't appear to be uncomfortable. Patient care to be resumed by Antonietta Breach, PA-C at shift change sign-out. Patient history has been discussed with midlevel resuming care. Please see their notes for further documentation of pending results and dispo/care. Pt stable at sign-out and updated on transfer of care.    Final Clinical Impressions(s) / ED Diagnoses   Final diagnoses:  Upper abdominal pain  Nausea and vomiting in adult patient  Hematemesis with nausea  Atypical chest pain  Tobacco user  Marijuana user  Prolonged Q-T interval on ECG  Chronic anemia  Thrombocytosis [facility])    ED Discharge Orders    [address] [place], [place] 07/12/17 2312    Fredia Sorrow, MD 07/14/17 [phone]

## 2017-07-12 NOTE — ED Triage Notes (Addendum)
Pt BIB [facility] c/o abdominal pain and N/V x1 week. She has a hx of gastric ulcers. Endorses hematemesis. Hx of requiring blood transfusions for this.

## 2017-07-13 ENCOUNTER — Other Ambulatory Visit: Payer: Self-pay | Admitting: Emergency Medicine

## 2017-07-13 LAB — URINALYSIS, ROUTINE W REFLEX MICROSCOPIC
Bilirubin Urine: NEGATIVE
GLUCOSE, UA: NEGATIVE mg/dL
KETONES UR: NEGATIVE mg/dL
Nitrite: NEGATIVE
PH: 6 (ref 5.0–8.0)
Protein, ur: NEGATIVE mg/dL
SPECIFIC GRAVITY, URINE: 1.019 (ref 1.005–1.030)
WBC, UA: >50 WBC/hpf — ABNORMAL HIGH (ref 0–5)

## 2017-07-13 LAB — RAPID URINE DRUG SCREEN, HOSP PERFORMED
Amphetamines: NOT DETECTED
BARBITURATES: NOT DETECTED
BENZODIAZEPINES: NOT DETECTED
COCAINE: POSITIVE — AB
Opiates: NOT DETECTED
TETRAHYDROCANNABINOL: POSITIVE — AB

## 2017-07-13 MED ORDER — CEPHALEXIN 500 MG PO CAPS: 500.0000 mg | ORAL_CAPSULE | Freq: Two times a day (BID) | ORAL | 0 refills | Status: DC

## 2017-07-13 MED ORDER — KETOROLAC TROMETHAMINE 30 MG/ML IJ SOLN
30.0000 mg | Freq: Once | INTRAMUSCULAR | Status: AC
  Administered 2017-07-13: 30 mg via INTRAVENOUS
  Filled 2017-07-13: qty 1

## 2017-07-13 MED ORDER — PANTOPRAZOLE SODIUM 20 MG PO TBEC: 40.0000 mg | DELAYED_RELEASE_TABLET | Freq: Every day | ORAL | 1 refills | Status: DC

## 2017-07-13 NOTE — Discharge Instructions (Signed)
Avoid the use of illicit drugs including cocaine.  You have been prescribed Protonix to take for management of your stomach irritation.  This is likely causing the blood in your vomit.  Avoid alcohol as well as spicy foods, citrus fruits, coffee, chocolate.  Take Keflex as prescribed until finished for management of likely urinary tract infection.  Follow-up with a gastroenterologist regarding your visit to the emergency department today.  You may return for new or concerning symptoms.

## 2017-07-13 NOTE — ED Provider Notes (Signed)
6:48 AM Patient care assumed from [facility], PA-C at change of shift.  Patient presenting to the emergency department complaining of 1 week of upper abdominal pain with reports of coffee-ground emesis.  She does have a history of GI bleed from duodenal ulcer in 2017 requiring transfusion.  Also history of polysubstance abuse.  Care assumed at midnight and the patient has not had any additional vomiting.  Also no vomiting since ED arrival; at this time going on 12 hours.  She has been resting comfortably in the emergency department with stable vital signs.  Work-up has been reviewed and is without leukocytosis.  She has no tachycardia, hypotension, elevated BUN, or change in anemia from baseline to suggest acute or emergent blood loss.  The patient has been able to tolerate oral fluids here as well as graham crackers without difficulty.  She has been managed with a GI cocktail as well as Phenergan and a PPI.  UDS positive for cocaine and marijuana.  Have withheld narcotics in light of history and positive UDS.  Urinalysis today significant for pyuria.  The patient has a history of urinary tract infection.  Will add on gonorrhea and chlamydia urine screen, but patient started on Keflex in the interim for UTI coverage.  Will also refer to GI given history of presenting symptoms.  I believe the patient is stable for continued outpatient follow-up at this time.  Return precautions discussed and provided. Patient discharged in stable condition with no unaddressed concerns.   Vitals:   07/13/17 0230 07/13/17 0257 07/13/17 0339 07/13/17 0606  BP: 114/78 114/78 114/88 126/86  Pulse: 69 87 88 86  Resp: 15 16 14 19   Temp:      TempSrc:      SpO2: 100% 100% 100% 100%      Antonietta Breach, PA-C 07/13/17 0652    Shanon Rosser, MD 07/13/17 [phone]

## 2017-07-14 LAB — GC/CHLAMYDIA PROBE AMP (~~LOC~~) NOT AT ARMC
Chlamydia: POSITIVE — AB
NEISSERIA GONORRHEA: NEGATIVE

## 2021-05-26 ENCOUNTER — Encounter (HOSPITAL_COMMUNITY): Payer: Self-pay

## 2021-05-26 ENCOUNTER — Emergency Department (HOSPITAL_COMMUNITY)
Admission: EM | Admit: 2021-05-26 | Discharge: 2021-05-27 | Disposition: A | Discharge status: Home / Self Care | Payer: Self-pay | Attending: Emergency Medicine | Admitting: Emergency Medicine

## 2021-05-26 ENCOUNTER — Other Ambulatory Visit: Payer: Self-pay

## 2021-05-26 DIAGNOSIS — Z23 Encounter for immunization: Secondary | ICD-10-CM | POA: Insufficient documentation

## 2021-05-26 DIAGNOSIS — L0291 Cutaneous abscess, unspecified: Secondary | ICD-10-CM

## 2021-05-26 DIAGNOSIS — L02213 Cutaneous abscess of chest wall: Secondary | ICD-10-CM | POA: Insufficient documentation

## 2021-05-26 MED ORDER — LIDOCAINE-EPINEPHRINE (PF) 2 %-1:200000 IJ SOLN
10.0000 mL | Freq: Once | INTRAMUSCULAR | Status: AC
  Administered 2021-05-27: 10 mL via INTRADERMAL
  Filled 2021-05-26: qty 20

## 2021-05-26 MED ORDER — TETANUS-DIPHTH-ACELL PERTUSSIS 5-2.5-18.5 LF-MCG/0.5 IM SUSY
0.5000 mL | PREFILLED_SYRINGE | Freq: Once | INTRAMUSCULAR | Status: AC
  Administered 2021-05-27: 0.5 mL via INTRAMUSCULAR
  Filled 2021-05-26: qty 0.5

## 2021-05-26 NOTE — ED Provider Triage Note (Signed)
Emergency Medicine Provider Triage Evaluation Note ? ?[name] , a 46 y.o. female  was evaluated in triage.  Pt complains of abscess. ? ?Review of Systems  ?Positive: Abscess, nausea, vomiting ?Negative: Fever, chills, sob ? ?Physical Exam  ?BP (!) 155/101 (BP Location: Right Arm)   Pulse 89   Temp 98 ?F (36.7 ?C)   Resp 18   Ht '5\' 1"'$  (1.549 m)   Wt 60.3 kg   LMP 05/19/2021   SpO2 99%   BMI 25.13 kg/m?  ?Gen:   Awake, no distress   ?Resp:  Normal effort  ?MSK:   Moves extremities without difficulty  ?Other:  Abscess to R upper chest ? ?Medical Decision Making  ?Medically screening exam initiated at 7:33 PM.  Appropriate orders placed.  [name] was informed that the remainder of the evaluation will be completed by another provider, this initial triage assessment does not replace that evaluation, and the importance of remaining in the ED until their evaluation is complete. ? ?Pt believed she may have been bitten by a spider to R upper chest several days prior.  Have noticed swelling and pain to affected area, along with nausea and vomiting. Denies IVDU ?  ?Domenic Moras, PA-C ?05/26/21 1937 ? ?

## 2021-05-26 NOTE — ED Triage Notes (Signed)
Pt BIBA from home. Pt states 2 days ago she was bit by a spider. Pt c/o lump on upper right chest with some pus. Pt c/o burning. Pt states she has had emesis since, unable to keep food and fluid down.  ? ?150/80 ?80 ?18 ?99% ?103FSBG ?

## 2021-05-27 MED ORDER — DOXYCYCLINE HYCLATE 100 MG PO CAPS: 100.0000 mg | ORAL_CAPSULE | Freq: Two times a day (BID) | ORAL | 0 refills | Status: DC

## 2021-05-27 MED ORDER — DOXYCYCLINE HYCLATE 100 MG PO TABS
100.0000 mg | ORAL_TABLET | Freq: Once | ORAL | Status: AC
  Administered 2021-05-27: 100 mg via ORAL
  Filled 2021-05-27: qty 1

## 2021-05-27 NOTE — ED Provider Notes (Signed)
?[facility] DEPT ?[facility] Emergency Department ?Provider Note ?MRN:  [id number]  ?Arrival date & time: 05/27/21    ? ?Chief Complaint   ?Insect Bite ?  ?History of Present Illness   ?[name] is a 46 y.o. year-old female presents to the ED with chief complaint of abscess.  She states that she has an abscess on her right upper chest wall.  She states that it has been there for a couple of days.  She denies any successful treatments prior to arrival.  She reports having multiple sores all over her body.  She has history of polysubstance abuse. ? ?History provided by patient. ? ? ?Review of Systems  ?Pertinent review of systems noted in HPI.  ? ? ?Physical Exam  ? ?Vitals:  ? 05/27/21 0016 05/27/21 0115  ?BP: (!) 141/91 117/73  ?Pulse: 78 75  ?Resp: 18 18  ?Temp:    ?SpO2: 99% 99%  ?  ?CONSTITUTIONAL: Nontoxic-appearing, NAD ?NEURO:  Alert and oriented x 3, CN 3-12 grossly intact ?EYES:  eyes equal and reactive ?ENT/NECK:  Supple, no stridor  ?CARDIO:  appears well-perfused  ?PULM:  No respiratory distress, CTAB ?GI/GU:  non-distended,  ?MSK/SPINE:  No gross deformities, no edema, moves all extremities  ?SKIN: 2 x 2 cm ulcerated abscess to the right upper chest wall with mild surrounding erythema ? ? ?*Additional and/or pertinent findings included in MDM below ? ?Diagnostic and Interventional Summary  ? ? ?Labs Reviewed - No data to display  ?No orders to display  ?  ?Medications  ?doxycycline (VIBRA-TABS) tablet 100 mg (has no administration in time range)  ?Tdap (BOOSTRIX) injection 0.5 mL (0.5 mLs Intramuscular Given 05/27/21 0055)  ?lidocaine-EPINEPHrine (XYLOCAINE W/EPI) 2 %-1:200000 (PF) injection 10 mL (10 mLs Intradermal Given 05/27/21 0057)  ?  ? ?Procedures  /  Critical Care ?Marland Kitchen.Incision and Drainage ? ?Date/Time: 05/27/2021 1:34 AM ?Performed by: Montine Circle, PA-C ?Authorized by: Montine Circle, PA-C  ? ?Consent:  ?  Consent obtained:  Verbal ?  Consent given by:  Patient ?  Risks  discussed:  Bleeding, incomplete drainage, pain and damage to other organs ?  Alternatives discussed:  No treatment ?Universal protocol:  ?  Procedure explained and questions answered to patient or proxy's satisfaction: yes   ?  Relevant documents present and verified: yes   ?  Test results available : yes   ?  Imaging studies available: yes   ?  Required blood products, implants, devices, and special equipment available: yes   ?  Site/side marked: yes   ?  Immediately prior to procedure, a time out was called: yes   ?  Patient identity confirmed:  Verbally with patient ?Location:  ?  Type:  Abscess ?Pre-procedure details:  ?  Skin preparation:  Betadine ?Anesthesia:  ?  Anesthesia method:  Local infiltration ?  Local anesthetic:  Lidocaine 1% WITH epi ?Procedure type:  ?  Complexity:  Simple ?Procedure details:  ?  Incision types:  Single straight ?  Incision depth:  Subcutaneous ?  Wound management:  Probed and deloculated, irrigated with saline and extensive cleaning ?  Drainage:  Purulent ?  Drainage amount:  Scant ?Post-procedure details:  ?  Procedure completion:  Tolerated well, no immediate complications ? ?ED Course and Medical Decision Making  ?I have reviewed the triage vital signs, the nursing notes, and pertinent available records from the EMR. ? ?Complexity of Problems Addressed: ?Moderate Complexity: Acute illness with systemic symptoms, requiring evaluation to rule out more  severe disease. ?Comorbidities affecting this illness/injury include: ?Polysubstance abuse ?Social Determinants Affecting Care: ?Complexity of care is increased due to drug abuse/addiction. ? ? ?ED Course: ?After considering the following differential, abscess, cellulitis, insect bite, I ordered lidocaine for bedside I&D. ? ?Clinical Course as of 05/27/21 0136  ?Sun May 27, 2021  ?0055 Passed PO challenge in ED. [RB]  ?  ?Clinical Course User Index ?[RB] Montine Circle, PA-C  ? ? ?Consultants: ?No consultations were needed in  caring for this patient. ? ?Treatment and Plan: ?Bedside I&D with mild purulence expressed.  We will treat with doxycycline. ? ?Emergency department workup does not suggest an emergent condition requiring admission or immediate intervention beyond  what has been performed at this time. The patient is safe for discharge and has  been instructed to return immediately for worsening symptoms, change in  symptoms or any other concerns ? ? ? ?Final Clinical Impressions(s) / ED Diagnoses  ? ?  ICD-10-CM   ?1. Abscess  L02.91   ?  ?  ?ED Discharge Orders   ? ?      Ordered  ?  doxycycline (VIBRAMYCIN) 100 MG capsule  2 times daily       ? 05/27/21 0136  ? ?  ?  ? ?  ?  ? ? ?Discharge Instructions Discussed with and Provided to Patient:  ? ?Discharge Instructions   ?None ?  ? ?  ?Montine Circle, PA-C ?05/27/21 0136 ? ?  ?Quintella Reichert, MD ?05/27/21 0210 ? ?

## 2022-09-06 ENCOUNTER — Inpatient Hospital Stay (HOSPITAL_COMMUNITY)
Admission: EM | Admit: 2022-09-06 | Discharge: 2022-09-08 | DRG: 379 | Disposition: A | Discharge status: Home / Self Care | Payer: Medicaid Other | Attending: Internal Medicine | Admitting: Internal Medicine

## 2022-09-06 ENCOUNTER — Emergency Department (HOSPITAL_COMMUNITY): Payer: Medicaid Other

## 2022-09-06 ENCOUNTER — Encounter (HOSPITAL_COMMUNITY): Payer: Self-pay

## 2022-09-06 ENCOUNTER — Other Ambulatory Visit: Payer: Self-pay

## 2022-09-06 DIAGNOSIS — Z9049 Acquired absence of other specified parts of digestive tract: Secondary | ICD-10-CM

## 2022-09-06 DIAGNOSIS — K219 Gastro-esophageal reflux disease without esophagitis: Secondary | ICD-10-CM | POA: Diagnosis present

## 2022-09-06 DIAGNOSIS — E876 Hypokalemia: Secondary | ICD-10-CM | POA: Diagnosis present

## 2022-09-06 DIAGNOSIS — K921 Melena: Secondary | ICD-10-CM | POA: Diagnosis present

## 2022-09-06 DIAGNOSIS — Z8249 Family history of ischemic heart disease and other diseases of the circulatory system: Secondary | ICD-10-CM

## 2022-09-06 DIAGNOSIS — I10 Essential (primary) hypertension: Secondary | ICD-10-CM | POA: Diagnosis present

## 2022-09-06 DIAGNOSIS — G8929 Other chronic pain: Secondary | ICD-10-CM | POA: Diagnosis present

## 2022-09-06 DIAGNOSIS — K449 Diaphragmatic hernia without obstruction or gangrene: Secondary | ICD-10-CM | POA: Diagnosis present

## 2022-09-06 DIAGNOSIS — Z7151 Drug abuse counseling and surveillance of drug abuser: Secondary | ICD-10-CM

## 2022-09-06 DIAGNOSIS — D649 Anemia, unspecified: Secondary | ICD-10-CM | POA: Diagnosis present

## 2022-09-06 DIAGNOSIS — R1013 Epigastric pain: Principal | ICD-10-CM

## 2022-09-06 DIAGNOSIS — Z79899 Other long term (current) drug therapy: Secondary | ICD-10-CM

## 2022-09-06 DIAGNOSIS — Z885 Allergy status to narcotic agent status: Secondary | ICD-10-CM

## 2022-09-06 DIAGNOSIS — K920 Hematemesis: Secondary | ICD-10-CM | POA: Diagnosis present

## 2022-09-06 DIAGNOSIS — F17210 Nicotine dependence, cigarettes, uncomplicated: Secondary | ICD-10-CM | POA: Diagnosis present

## 2022-09-06 DIAGNOSIS — Z791 Long term (current) use of non-steroidal anti-inflammatories (NSAID): Secondary | ICD-10-CM

## 2022-09-06 DIAGNOSIS — Z716 Tobacco abuse counseling: Secondary | ICD-10-CM

## 2022-09-06 DIAGNOSIS — R112 Nausea with vomiting, unspecified: Secondary | ICD-10-CM | POA: Diagnosis present

## 2022-09-06 DIAGNOSIS — K250 Acute gastric ulcer with hemorrhage: Principal | ICD-10-CM | POA: Diagnosis present

## 2022-09-06 DIAGNOSIS — Z720 Tobacco use: Secondary | ICD-10-CM | POA: Diagnosis present

## 2022-09-06 DIAGNOSIS — K429 Umbilical hernia without obstruction or gangrene: Secondary | ICD-10-CM | POA: Diagnosis present

## 2022-09-06 DIAGNOSIS — D62 Acute posthemorrhagic anemia: Secondary | ICD-10-CM | POA: Diagnosis present

## 2022-09-06 DIAGNOSIS — F32A Depression, unspecified: Secondary | ICD-10-CM | POA: Diagnosis present

## 2022-09-06 DIAGNOSIS — F1410 Cocaine abuse, uncomplicated: Secondary | ICD-10-CM | POA: Diagnosis present

## 2022-09-06 DIAGNOSIS — K589 Irritable bowel syndrome without diarrhea: Secondary | ICD-10-CM | POA: Diagnosis present

## 2022-09-06 DIAGNOSIS — K222 Esophageal obstruction: Secondary | ICD-10-CM | POA: Diagnosis present

## 2022-09-06 DIAGNOSIS — F1210 Cannabis abuse, uncomplicated: Secondary | ICD-10-CM | POA: Diagnosis present

## 2022-09-06 DIAGNOSIS — K2100 Gastro-esophageal reflux disease with esophagitis, without bleeding: Secondary | ICD-10-CM | POA: Diagnosis present

## 2022-09-06 DIAGNOSIS — E669 Obesity, unspecified: Secondary | ICD-10-CM | POA: Diagnosis present

## 2022-09-06 LAB — TYPE AND SCREEN
ABO/RH(D): O NEG
Antibody Screen: NEGATIVE

## 2022-09-06 LAB — CBC WITH DIFFERENTIAL/PLATELET
Abs Immature Granulocytes: 0.04 10*3/uL (ref 0.00–0.07)
Basophils Absolute: 0 10*3/uL (ref 0.0–0.1)
Basophils Relative: 0 %
Eosinophils Absolute: 0.1 10*3/uL (ref 0.0–0.5)
Eosinophils Relative: 1 %
HCT: 34.5 % — ABNORMAL LOW (ref 36.0–46.0)
Hemoglobin: 11.2 g/dL — ABNORMAL LOW (ref 12.0–15.0)
Immature Granulocytes: 0 %
Lymphocytes Relative: 13 %
Lymphs Abs: 1.5 10*3/uL (ref 0.7–4.0)
MCH: 28.9 pg (ref 26.0–34.0)
MCHC: 32.5 g/dL (ref 30.0–36.0)
MCV: 89.1 fL (ref 80.0–100.0)
Monocytes Absolute: 0.7 10*3/uL (ref 0.1–1.0)
Monocytes Relative: 6 %
Neutro Abs: 9.5 10*3/uL — ABNORMAL HIGH (ref 1.7–7.7)
Neutrophils Relative %: 80 %
Platelets: 398 10*3/uL (ref 150–400)
RBC: 3.87 MIL/uL (ref 3.87–5.11)
RDW: 15.8 % — ABNORMAL HIGH (ref 11.5–15.5)
WBC: 11.9 10*3/uL — ABNORMAL HIGH (ref 4.0–10.5)
nRBC: 0 % (ref 0.0–0.2)

## 2022-09-06 LAB — BASIC METABOLIC PANEL
Anion gap: 6 (ref 5–15)
BUN: 8 mg/dL (ref 6–20)
CO2: 23 mmol/L (ref 22–32)
Calcium: 7.8 mg/dL — ABNORMAL LOW (ref 8.9–10.3)
Chloride: 106 mmol/L (ref 98–111)
Creatinine, Ser: 0.57 mg/dL (ref 0.44–1.00)
GFR, Estimated: >60 mL/min (ref >60)
Glucose, Bld: 90 mg/dL (ref 70–99)
Potassium: 3.2 mmol/L — ABNORMAL LOW (ref 3.5–5.1)
Sodium: 135 mmol/L (ref 135–145)

## 2022-09-06 LAB — COMPREHENSIVE METABOLIC PANEL
ALT: 10 U/L (ref 0–44)
AST: 13 U/L — ABNORMAL LOW (ref 15–41)
Albumin: 2.9 g/dL — ABNORMAL LOW (ref 3.5–5.0)
Alkaline Phosphatase: 49 U/L (ref 38–126)
Anion gap: 6 (ref 5–15)
BUN: 13 mg/dL (ref 6–20)
CO2: 22 mmol/L (ref 22–32)
Calcium: 6.8 mg/dL — ABNORMAL LOW (ref 8.9–10.3)
Chloride: 108 mmol/L (ref 98–111)
Creatinine, Ser: 0.63 mg/dL (ref 0.44–1.00)
GFR, Estimated: >60 mL/min (ref >60)
Glucose, Bld: 94 mg/dL (ref 70–99)
Potassium: <2 mmol/L — CL (ref 3.5–5.1)
Sodium: 136 mmol/L (ref 135–145)
Total Bilirubin: 0.6 mg/dL (ref 0.3–1.2)
Total Protein: 6.1 g/dL — ABNORMAL LOW (ref 6.5–8.1)

## 2022-09-06 LAB — LACTIC ACID, PLASMA
Lactic Acid, Venous: 0.8 mmol/L (ref 0.5–1.9)
Lactic Acid, Venous: 0.8 mmol/L (ref 0.5–1.9)

## 2022-09-06 LAB — RAPID URINE DRUG SCREEN, HOSP PERFORMED
Amphetamines: NOT DETECTED
Barbiturates: NOT DETECTED
Benzodiazepines: NOT DETECTED
Cocaine: POSITIVE — AB
Opiates: POSITIVE — AB
Tetrahydrocannabinol: POSITIVE — AB

## 2022-09-06 LAB — PHOSPHORUS: Phosphorus: 4.5 mg/dL (ref 2.5–4.6)

## 2022-09-06 LAB — HEMOGLOBIN AND HEMATOCRIT, BLOOD
HCT: 31.7 % — ABNORMAL LOW (ref 36.0–46.0)
HCT: 32 % — ABNORMAL LOW (ref 36.0–46.0)
Hemoglobin: 10.1 g/dL — ABNORMAL LOW (ref 12.0–15.0)
Hemoglobin: 10.3 g/dL — ABNORMAL LOW (ref 12.0–15.0)

## 2022-09-06 LAB — POTASSIUM: Potassium: 2.7 mmol/L — CL (ref 3.5–5.1)

## 2022-09-06 LAB — HIV ANTIBODY (ROUTINE TESTING W REFLEX): HIV Screen 4th Generation wRfx: NONREACTIVE

## 2022-09-06 LAB — HCG, QUANTITATIVE, PREGNANCY: hCG, Beta Chain, Quant, S: <1 m[IU]/mL (ref <5)

## 2022-09-06 LAB — PREGNANCY, URINE: Preg Test, Ur: NEGATIVE

## 2022-09-06 LAB — TROPONIN I (HIGH SENSITIVITY)
Troponin I (High Sensitivity): 6 ng/L (ref <18)
Troponin I (High Sensitivity): 7 ng/L (ref <18)

## 2022-09-06 LAB — LIPASE, BLOOD: Lipase: 25 U/L (ref 11–51)

## 2022-09-06 LAB — MAGNESIUM: Magnesium: 1.9 mg/dL (ref 1.7–2.4)

## 2022-09-06 MED ORDER — HYDROMORPHONE HCL 1 MG/ML IJ SOLN
1.0000 mg | Freq: Once | INTRAMUSCULAR | Status: AC
  Administered 2022-09-06: 1 mg via INTRAVENOUS
  Filled 2022-09-06: qty 1

## 2022-09-06 MED ORDER — POTASSIUM CHLORIDE 10 MEQ/100ML IV SOLN
10.0000 meq | INTRAVENOUS | Status: AC
  Administered 2022-09-06 (×3): 10 meq via INTRAVENOUS
  Filled 2022-09-06 (×3): qty 100

## 2022-09-06 MED ORDER — SODIUM CHLORIDE (PF) 0.9 % IJ SOLN
INTRAMUSCULAR | Status: AC
  Filled 2022-09-06: qty 50

## 2022-09-06 MED ORDER — HYDRALAZINE HCL 20 MG/ML IJ SOLN
10.0000 mg | INTRAMUSCULAR | Status: DC | PRN
  Administered 2022-09-07: 10 mg via INTRAVENOUS
  Filled 2022-09-06: qty 1

## 2022-09-06 MED ORDER — SODIUM CHLORIDE 0.9 % IV BOLUS
1000.0000 mL | Freq: Once | INTRAVENOUS | Status: AC
  Administered 2022-09-06: 1000 mL via INTRAVENOUS

## 2022-09-06 MED ORDER — HYDRALAZINE HCL 20 MG/ML IJ SOLN
5.0000 mg | Freq: Once | INTRAMUSCULAR | Status: AC
  Administered 2022-09-06: 5 mg via INTRAVENOUS
  Filled 2022-09-06: qty 1

## 2022-09-06 MED ORDER — POTASSIUM CHLORIDE 10 MEQ/100ML IV SOLN
10.0000 meq | INTRAVENOUS | Status: AC
  Administered 2022-09-06: 10 meq via INTRAVENOUS
  Filled 2022-09-06: qty 100

## 2022-09-06 MED ORDER — POTASSIUM CHLORIDE IN NACL 40-0.9 MEQ/L-% IV SOLN
INTRAVENOUS | Status: AC
  Filled 2022-09-06: qty 1000

## 2022-09-06 MED ORDER — PANTOPRAZOLE SODIUM 40 MG IV SOLR
40.0000 mg | Freq: Once | INTRAVENOUS | Status: AC
  Administered 2022-09-06: 40 mg via INTRAVENOUS
  Filled 2022-09-06: qty 10

## 2022-09-06 MED ORDER — POTASSIUM CHLORIDE IN NACL 40-0.9 MEQ/L-% IV SOLN
INTRAVENOUS | Status: DC
  Filled 2022-09-06: qty 1000

## 2022-09-06 MED ORDER — MAGNESIUM SULFATE 2 GM/50ML IV SOLN
2.0000 g | Freq: Once | INTRAVENOUS | Status: AC
  Administered 2022-09-06: 2 g via INTRAVENOUS
  Filled 2022-09-06: qty 50

## 2022-09-06 MED ORDER — LABETALOL HCL 5 MG/ML IV SOLN: 20.0000 mg | INTRAVENOUS | Status: DC | PRN

## 2022-09-06 MED ORDER — PROCHLORPERAZINE EDISYLATE 10 MG/2ML IJ SOLN
10.0000 mg | Freq: Once | INTRAMUSCULAR | Status: AC
  Administered 2022-09-06: 10 mg via INTRAVENOUS
  Filled 2022-09-06: qty 2

## 2022-09-06 MED ORDER — PANTOPRAZOLE INFUSION (NEW) - SIMPLE MED
8.0000 mg/h | INTRAVENOUS | Status: DC
  Administered 2022-09-06 – 2022-09-07 (×4): 8 mg/h via INTRAVENOUS
  Filled 2022-09-06: qty 100
  Filled 2022-09-06 (×2): qty 80
  Filled 2022-09-06 (×2): qty 100
  Filled 2022-09-06 (×2): qty 80

## 2022-09-06 MED ORDER — IOHEXOL 350 MG/ML SOLN
100.0000 mL | Freq: Once | INTRAVENOUS | Status: AC | PRN
  Administered 2022-09-06: 100 mL via INTRAVENOUS

## 2022-09-06 MED ORDER — ONDANSETRON HCL 4 MG/2ML IJ SOLN
4.0000 mg | Freq: Once | INTRAMUSCULAR | Status: AC
  Administered 2022-09-06: 4 mg via INTRAVENOUS
  Filled 2022-09-06: qty 2

## 2022-09-06 MED ORDER — PANTOPRAZOLE SODIUM 40 MG IV SOLR: 40.0000 mg | Freq: Two times a day (BID) | INTRAVENOUS | Status: DC

## 2022-09-06 MED ORDER — HYDROMORPHONE HCL 1 MG/ML IJ SOLN
1.0000 mg | INTRAMUSCULAR | Status: DC | PRN
  Administered 2022-09-07 (×2): 1 mg via INTRAVENOUS
  Filled 2022-09-06 (×3): qty 1

## 2022-09-06 NOTE — Consult Note (Signed)
Reason for Consult: Abdominal pain hematemesis Referring Physician: Hospital team  [name] is an 47 y.o. female.  HPI: Patient seen and examined and hospital computer chart reviewed and she has had bad midepigastric abdominal pain for 2 days but does not look at her bowels although they are normal and supposedly she had some coffee-ground emesis but she is very lethargic yet when awoken moderately angry and just wants to sleep and adequate history is difficult but she cannot tell me what medicine she takes at home and does not want to engage in conversation  Past Medical History:  Diagnosis Date   Anemia 05/2015   due to blood loss from duodenal ulcer.  transfused 3 PRBCs.    Chronic abdominal pain 2008   ? IBS.  multiple CT scans: enlarged uterus.   Depression    with mood swings   Duodenal ulcer 05/2015   Esophagitis 2011   GERD (gastroesophageal reflux disease)    Hypertension    Obesity 2011   Polysubstance abuse (HCC) 09/28/2010   cocaine, opiates, THC, tobacco.       Past Surgical History:  Procedure Laterality Date   CHOLECYSTECTOMY  2004   ESOPHAGOGASTRODUODENOSCOPY N/A 06/04/2015   Procedure: ESOPHAGOGASTRODUODENOSCOPY (EGD);  Surgeon: Meryl Dare, MD;  Location: [facility];  Service: Endoscopy;  Laterality: N/A;   LAPAROSCOPIC APPENDECTOMY  11/2006   Dr Abbey Chatters   TONSILLECTOMY     TUBAL LIGATION      Family History  Problem Relation Age of Onset   Hypertension Mother    Heart disease Father 35       MI   Stroke Father 76   Diabetes Father    GER disease Sister    Cancer Maternal Grandmother        uterine and stomach    Social History:  reports that she has been smoking cigarettes. She has been smoking an average of .3 packs per day. She has never used smokeless tobacco. She reports that she does not drink alcohol and does not use drugs.  Allergies:  Allergies  Allergen Reactions   Hydrocodone Nausea And Vomiting    Medications: I have  reviewed the patient's current medications.  Results for orders placed or performed during the hospital encounter of 09/06/22 (from the past 48 hour(s))  CBC with Differential     Status: Abnormal   Collection Time: 09/06/22  2:46 AM  Result Value Ref Range   WBC 11.9 (H) 4.0 - 10.5 K/uL   RBC 3.87 3.87 - 5.11 MIL/uL   Hemoglobin 11.2 (L) 12.0 - 15.0 g/dL   HCT 16.1 (L) 09.6 - 04.5 %   MCV 89.1 80.0 - 100.0 fL   MCH 28.9 26.0 - 34.0 pg   MCHC 32.5 30.0 - 36.0 g/dL   RDW 40.9 (H) 81.1 - 91.4 %   Platelets 398 150 - 400 K/uL   nRBC 0.0 0.0 - 0.2 %   Neutrophils Relative % 80 %   Neutro Abs 9.5 (H) 1.7 - 7.7 K/uL   Lymphocytes Relative 13 %   Lymphs Abs 1.5 0.7 - 4.0 K/uL   Monocytes Relative 6 %   Monocytes Absolute 0.7 0.1 - 1.0 K/uL   Eosinophils Relative 1 %   Eosinophils Absolute 0.1 0.0 - 0.5 K/uL   Basophils Relative 0 %   Basophils Absolute 0.0 0.0 - 0.1 K/uL   Immature Granulocytes 0 %   Abs Immature Granulocytes 0.04 0.00 - 0.07 K/uL    Comment: Performed  at [facility], [address] [place], [place] [address]  Troponin I (High Sensitivity)     Status: None   Collection Time: 09/06/22  2:46 AM  Result Value Ref Range   Troponin I (High Sensitivity) 6 <18 ng/L    Comment: (NOTE) Elevated high sensitivity troponin I (hsTnI) values and significant  changes across serial measurements may suggest ACS but many other  chronic and acute conditions are known to elevate hsTnI results.  Refer to the "Links" section for chest pain algorithms and additional  guidance. Performed at [facility], [address], [place], [place] [address]   Comprehensive metabolic panel     Status: Abnormal   Collection Time: 09/06/22  2:46 AM  Result Value Ref Range   Sodium 136 135 - 145 mmol/L   Potassium <2.0 (LL) 3.5 - 5.1 mmol/L    Comment: REPEATED TO VERIFY CRITICAL RESULT CALLED TO, READ BACK BY AND VERIFIED WITH A. KELSEY, RN 09/06/22 0356 BY  K. DAVIS    Chloride 108 98 - 111 mmol/L   CO2 22 22 - 32 mmol/L   Glucose, Bld 94 70 - 99 mg/dL    Comment: Glucose reference range applies only to samples taken after fasting for at least 8 hours.   BUN 13 6 - 20 mg/dL   Creatinine, Ser 0.98 0.44 - 1.00 mg/dL   Calcium 6.8 (L) 8.9 - 10.3 mg/dL   Total Protein 6.1 (L) 6.5 - 8.1 g/dL   Albumin 2.9 (L) 3.5 - 5.0 g/dL   AST 13 (L) 15 - 41 U/L   ALT 10 0 - 44 U/L   Alkaline Phosphatase 49 38 - 126 U/L   Total Bilirubin 0.6 0.3 - 1.2 mg/dL   GFR, Estimated >11 >91 mL/min    Comment: (NOTE) Calculated using the CKD-EPI Creatinine Equation (2021)    Anion gap 6 5 - 15    Comment: Performed at [facility], [address] [place], [place] [address]  Lipase, blood     Status: None   Collection Time: 09/06/22  2:46 AM  Result Value Ref Range   Lipase 25 11 - 51 U/L    Comment: Performed at [facility], [address], [place], [place] [address]  Magnesium     Status: None   Collection Time: 09/06/22  4:33 AM  Result Value Ref Range   Magnesium 1.9 1.7 - 2.4 mg/dL    Comment: Performed at [facility], [address], [place], [place] [address]  Potassium     Status: Abnormal   Collection Time: 09/06/22  4:33 AM  Result Value Ref Range   Potassium 2.7 (LL) 3.5 - 5.1 mmol/L    Comment: CRITICAL RESULT CALLED TO, READ BACK BY AND VERIFIED WITH Lauro Regulus, RN 09/06/22 0512 BY Kirtland Bouchard. DAVIS Performed at [facility], [address] [place], [place] [address]   Lactic acid, plasma     Status: None   Collection Time: 09/06/22  4:33 AM  Result Value Ref Range   Lactic Acid, Venous 0.8 0.5 - 1.9 mmol/L    Comment: Performed at [facility], [address], [place], [place] [address]  Troponin I (High Sensitivity)     Status: None   Collection Time: 09/06/22  4:33 AM  Result Value Ref Range   Troponin I (High Sensitivity) 7 <18 ng/L    Comment:  (NOTE) Elevated high sensitivity troponin I (hsTnI) values  and significant  changes across serial measurements may suggest ACS but many other  chronic and acute conditions are known to elevate hsTnI results.  Refer to the "Links" section for chest pain algorithms and additional  guidance. Performed at [facility], [address] [place], [place] [address]   hCG, quantitative, pregnancy     Status: None   Collection Time: 09/06/22  4:33 AM  Result Value Ref Range   hCG, Beta Chain, Quant, S <1 <5 mIU/mL    Comment:          GEST. AGE      CONC.  (mIU/mL)   <=1 WEEK        5 - 50     2 WEEKS       50 - 500     3 WEEKS       100 - 10,000     4 WEEKS     1,000 - 30,000     5 WEEKS     3,500 - 115,000   6-8 WEEKS     12,000 - 270,000    12 WEEKS     15,000 - 220,000        FEMALE AND NON-PREGNANT FEMALE:     LESS THAN 5 mIU/mL Performed at [facility], [address], [place], [place] [address]   Phosphorus     Status: None   Collection Time: 09/06/22  4:33 AM  Result Value Ref Range   Phosphorus 4.5 2.5 - 4.6 mg/dL    Comment: Performed at [facility], [address], [place], [place] [address]  Lactic acid, plasma     Status: None   Collection Time: 09/06/22  6:35 AM  Result Value Ref Range   Lactic Acid, Venous 0.8 0.5 - 1.9 mmol/L    Comment: Performed at [facility], [address], [place], [place] [address]  Urine rapid drug screen (hosp performed)     Status: Abnormal   Collection Time: 09/06/22  6:50 AM  Result Value Ref Range   Opiates POSITIVE (A) NONE DETECTED   Cocaine POSITIVE (A) NONE DETECTED   Benzodiazepines NONE DETECTED NONE DETECTED   Amphetamines NONE DETECTED NONE DETECTED   Tetrahydrocannabinol POSITIVE (A) NONE DETECTED   Barbiturates NONE DETECTED NONE DETECTED    Comment: (NOTE) DRUG SCREEN FOR MEDICAL PURPOSES ONLY.  IF CONFIRMATION IS NEEDED FOR ANY PURPOSE, NOTIFY  LAB WITHIN 5 DAYS.  LOWEST DETECTABLE LIMITS FOR URINE DRUG SCREEN Drug Class                     Cutoff (ng/mL) Amphetamine and metabolites    1000 Barbiturate and metabolites    200 Benzodiazepine                 200 Opiates and metabolites        300 Cocaine and metabolites        300 THC                            50 Performed at [facility], [address] [place], [place] [address]   Pregnancy, urine     Status: None   Collection Time: 09/06/22  6:50 AM  Result Value Ref Range   Preg Test, Ur NEGATIVE NEGATIVE    Comment:        THE SENSITIVITY OF THIS METHODOLOGY IS >25 mIU/mL.  Performed at [facility], [address], [place], [place] [address]     CT Angio Chest/Abd/Pel for Dissection W and/or Wo Contrast  Result Date: 09/06/2022 CLINICAL DATA:  47 year old female with history of hypertension presenting with signs and symptoms of possible acute aortic syndrome. EXAM: CT ANGIOGRAPHY CHEST, ABDOMEN AND PELVIS TECHNIQUE: Non-contrast CT of the chest was initially obtained. Multidetector CT imaging through the chest, abdomen and pelvis was performed using the standard protocol during bolus administration of intravenous contrast. Multiplanar reconstructed images and MIPs were obtained and reviewed to evaluate the vascular anatomy. RADIATION DOSE REDUCTION: This exam was performed according to the departmental dose-optimization program which includes automated exposure control, adjustment of the mA and/or kV according to patient size and/or use of iterative reconstruction technique. CONTRAST:  OMNIPAQUE IOHEXOL 350 MG/ML SOLN COMPARISON:  Chest CTA 08/09/2014. CT of the abdomen and pelvis 06/02/2015. FINDINGS: CTA CHEST FINDINGS Cardiovascular: Precontrast images demonstrate no crescentic high attenuation associated with the wall of the thoracic aorta to suggest the presence of acute intramural hemorrhage. Postcontrast images demonstrate  no evidence of thoracic aortic aneurysm or dissection. Thoracic aorta is normal in caliber measuring 3.5 cm, 2.6 cm and 2.4 cm in diameter in the ascending thoracic aorta, mid arch and descending thoracic aorta respectively. No atherosclerotic calcifications are noted in the thoracic aorta or the coronary arteries. Heart size is normal. There is no significant pericardial fluid, thickening or pericardial calcification. Mediastinum/Nodes: No pathologically enlarged mediastinal or hilar lymph nodes. Esophagus is unremarkable in appearance. No axillary lymphadenopathy. Lungs/Pleura: Mild centrilobular and paraseptal emphysema. Small calcified granuloma in the right upper lobe. No other suspicious appearing pulmonary nodules or masses are noted. No pneumothorax. No acute consolidative airspace disease. No pleural effusions. Musculoskeletal: There are no aggressive appearing lytic or blastic lesions noted in the visualized portions of the skeleton. Review of the MIP images confirms the above findings. CTA ABDOMEN AND PELVIS FINDINGS VASCULAR Aorta: Mild aortic atherosclerosis. Normal caliber aorta without aneurysm, dissection, vasculitis or significant stenosis. Celiac: Patent without evidence of aneurysm, dissection, vasculitis or significant stenosis. SMA: Patent without evidence of aneurysm, dissection, vasculitis or significant stenosis. Renals: Both renal arteries are patent without evidence of aneurysm, dissection, vasculitis, fibromuscular dysplasia or significant stenosis. IMA: Patent without evidence of aneurysm, dissection, vasculitis or significant stenosis. Inflow: Patent without evidence of aneurysm, dissection, vasculitis or significant stenosis. Veins: No obvious venous abnormality within the limitations of this arterial phase study. Review of the MIP images confirms the above findings. NON-VASCULAR Hepatobiliary: No definite suspicious cystic or solid hepatic lesions are noted on today's arterial phase  examination. Status post cholecystectomy. No intra or extrahepatic biliary ductal dilatation. Pancreas: No pancreatic mass. No pancreatic ductal dilatation. No pancreatic or peripancreatic fluid collections or inflammatory changes. Spleen: Unremarkable. Adrenals/Urinary Tract: Bilateral kidneys and adrenal glands are normal in appearance. No hydroureteronephrosis. Urinary bladder is normal in appearance. Stomach/Bowel: Stomach is unremarkable in appearance. No pathologic dilatation of small bowel or colon. The appendix is not confidently identified and may be surgically absent. Regardless, there are no inflammatory changes noted adjacent to the cecum to suggest the presence of an acute appendicitis at this time. Lymphatic: No lymphadenopathy noted in the abdomen or pelvis. Reproductive: Uterus and ovaries are unremarkable in appearance. Other: Small umbilical hernia containing omental fat. No significant volume of ascites. No pneumoperitoneum. Musculoskeletal: There are no aggressive appearing lytic or blastic lesions noted in the visualized portions of the skeleton. Review of the MIP images confirms the above  findings. IMPRESSION: 1. No findings to suggest acute aortic syndrome. 2. No acute findings are noted in the abdomen or pelvis to account for the patient's symptoms. 3. Mild centrilobular and paraseptal emphysema. 4. Mild atherosclerosis of the abdominal aorta. 5. Small umbilical hernia containing only omental fat. No associated bowel incarceration or obstruction at this time. Electronically Signed   By: Trudie Reed M.D.   On: 09/06/2022 06:36   DG Chest 2 View  Result Date: 09/06/2022 CLINICAL DATA:  Chest pain. EXAM: CHEST - 2 VIEW COMPARISON:  AP chest 07/12/2017 FINDINGS: There is mild cardiomegaly. No vascular congestion is seen. The mediastinum is normally outlined. The lungs are clear. There is age advanced degenerative disc disease at multiple thoracic spine levels spread no acute osseous  findings. Two pericardial and stable chest. IMPRESSION: No active cardiopulmonary disease. Mild cardiomegaly. 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 Electronically Signed   By: Almira Bar M.D.   On: 09/06/2022 04:29    Review of Systems negative except above Blood pressure (!) 164/84, pulse 77, temperature 98.3 F (36.8 C), temperature source Oral, resp. rate 18, SpO2 98 %. Physical Exam vital signs stable afebrile no acute distress sleeping most of the time as above abdomen is only minimally tender without any rebound no significant guarding chemistry is okay hemoglobin 11.2 with minimal elevated white count at 11.9 CT okay previous endoscopy reviewed talk screen positive  Assessment/Plan: Probably peptic ulcer disease Plan: Treat her empirically for now with  clear liquid diet and Protonix and she might ultimately require an endoscopy and please call us if signs of active bleeding or a more significant acute problem otherwise we will ask my partner who is rounding tomorrow Dr. Marca Ancona to check on her and then we can decide how to proceed  [facility] E 09/06/2022, 10:44 AM

## 2022-09-06 NOTE — ED Notes (Signed)
ED TO INPATIENT HANDOFF REPORT  ED Nurse Name and Phone #: Crist Infante, RN [phone]  S Name/Age/Gender [name] 47 y.o. female Room/Bed: WA23/WA23  Code Status   Code Status: Full Code  Home/SNF/Other Home Patient oriented to: self, place, time, and situation Is this baseline? Yes   Triage Complete: Triage complete  Chief Complaint Hematemesis [K92.0]  Triage Note PT. BIB [facility] for abd. Pain, N&V. Pt. Has hx of stomach cancer, VSS with EMS.   Allergies Allergies  Allergen Reactions   Hydrocodone Nausea And Vomiting    Level of Care/Admitting Diagnosis ED Disposition     ED Disposition  Admit   Condition  --   Comment  Hospital Area: [facility] [[id number]]  Level of Care: Progressive [102]  Admit to Progressive based on following criteria: GI, ENDOCRINE disease patients with GI bleeding, acute liver failure or pancreatitis, stable with diabetic ketoacidosis or thyrotoxicosis (hypothyroid) state.  May place patient in observation at [facility] or [facility] if equivalent level of care is available:: No  Covid Evaluation: Asymptomatic - no recent exposure (last 10 days) testing not required  Diagnosis: Hematemesis [578.0.ICD-9-CM]  Admitting Physician: Bobette Mo [[id number]]  Attending Physician: Bobette Mo [[id number]]          B Medical/Surgery History Past Medical History:  Diagnosis Date   Anemia 05/2015   due to blood loss from duodenal ulcer.  transfused 3 PRBCs.    Chronic abdominal pain 2008   ? IBS.  multiple CT scans: enlarged uterus.   Depression    with mood swings   Duodenal ulcer 05/2015   Esophagitis 2011   GERD (gastroesophageal reflux disease)    Hypertension    Obesity 2011   Polysubstance abuse (HCC) 09/28/2010   cocaine, opiates, THC, tobacco.      Past Surgical History:  Procedure Laterality Date   CHOLECYSTECTOMY  2004   ESOPHAGOGASTRODUODENOSCOPY N/A 06/04/2015   Procedure:  ESOPHAGOGASTRODUODENOSCOPY (EGD);  Surgeon: Meryl Dare, MD;  Location: [facility];  Service: Endoscopy;  Laterality: N/A;   LAPAROSCOPIC APPENDECTOMY  11/2006   Dr Abbey Chatters   TONSILLECTOMY     TUBAL LIGATION       A IV Location/Drains/Wounds Patient Lines/Drains/Airways Status     Active Line/Drains/Airways     Name Placement date Placement time Site Days   Peripheral IV 09/06/22 20 G Left Antecubital 09/06/22  0250  Antecubital  less than 1            Intake/Output Last 24 hours  Intake/Output Summary (Last 24 hours) at 09/06/2022 1531 Last data filed at 09/06/2022 1204 Gross per 24 hour  Intake 349.91 ml  Output --  Net 349.91 ml    Labs/Imaging Results for orders placed or performed during the hospital encounter of 09/06/22 (from the past 48 hour(s))  CBC with Differential     Status: Abnormal   Collection Time: 09/06/22  2:46 AM  Result Value Ref Range   WBC 11.9 (H) 4.0 - 10.5 K/uL   RBC 3.87 3.87 - 5.11 MIL/uL   Hemoglobin 11.2 (L) 12.0 - 15.0 g/dL   HCT 57.8 (L) 46.9 - 62.9 %   MCV 89.1 80.0 - 100.0 fL   MCH 28.9 26.0 - 34.0 pg   MCHC 32.5 30.0 - 36.0 g/dL   RDW 52.8 (H) 41.3 - 24.4 %   Platelets 398 150 - 400 K/uL   nRBC 0.0 0.0 - 0.2 %   Neutrophils Relative % 80 %  Neutro Abs 9.5 (H) 1.7 - 7.7 K/uL   Lymphocytes Relative 13 %   Lymphs Abs 1.5 0.7 - 4.0 K/uL   Monocytes Relative 6 %   Monocytes Absolute 0.7 0.1 - 1.0 K/uL   Eosinophils Relative 1 %   Eosinophils Absolute 0.1 0.0 - 0.5 K/uL   Basophils Relative 0 %   Basophils Absolute 0.0 0.0 - 0.1 K/uL   Immature Granulocytes 0 %   Abs Immature Granulocytes 0.04 0.00 - 0.07 K/uL    Comment: Performed at [facility], [address], [place], [place] [address]  Troponin I (High Sensitivity)     Status: None   Collection Time: 09/06/22  2:46 AM  Result Value Ref Range   Troponin I (High Sensitivity) 6 <18 ng/L    Comment: (NOTE) Elevated high sensitivity troponin I  (hsTnI) values and significant  changes across serial measurements may suggest ACS but many other  chronic and acute conditions are known to elevate hsTnI results.  Refer to the "Links" section for chest pain algorithms and additional  guidance. Performed at [facility], [address] [place], [place] [address]   Comprehensive metabolic panel     Status: Abnormal   Collection Time: 09/06/22  2:46 AM  Result Value Ref Range   Sodium 136 135 - 145 mmol/L   Potassium <2.0 (LL) 3.5 - 5.1 mmol/L    Comment: REPEATED TO VERIFY CRITICAL RESULT CALLED TO, READ BACK BY AND VERIFIED WITH A. KELSEY, RN 09/06/22 0356 BY K. DAVIS    Chloride 108 98 - 111 mmol/L   CO2 22 22 - 32 mmol/L   Glucose, Bld 94 70 - 99 mg/dL    Comment: Glucose reference range applies only to samples taken after fasting for at least 8 hours.   BUN 13 6 - 20 mg/dL   Creatinine, Ser 6.96 0.44 - 1.00 mg/dL   Calcium 6.8 (L) 8.9 - 10.3 mg/dL   Total Protein 6.1 (L) 6.5 - 8.1 g/dL   Albumin 2.9 (L) 3.5 - 5.0 g/dL   AST 13 (L) 15 - 41 U/L   ALT 10 0 - 44 U/L   Alkaline Phosphatase 49 38 - 126 U/L   Total Bilirubin 0.6 0.3 - 1.2 mg/dL   GFR, Estimated >29 >52 mL/min    Comment: (NOTE) Calculated using the CKD-EPI Creatinine Equation (2021)    Anion gap 6 5 - 15    Comment: Performed at [facility], [address] [place], [place] [address]  Lipase, blood     Status: None   Collection Time: 09/06/22  2:46 AM  Result Value Ref Range   Lipase 25 11 - 51 U/L    Comment: Performed at [facility], [address], [place], [place] [address]  Magnesium     Status: None   Collection Time: 09/06/22  4:33 AM  Result Value Ref Range   Magnesium 1.9 1.7 - 2.4 mg/dL    Comment: Performed at [facility], [address], [place], [place] [address]  Potassium     Status: Abnormal   Collection Time: 09/06/22  4:33 AM  Result Value Ref Range    Potassium 2.7 (LL) 3.5 - 5.1 mmol/L    Comment: CRITICAL RESULT CALLED TO, READ BACK BY AND VERIFIED WITH Lauro Regulus, RN 09/06/22 0512 BY Kirtland Bouchard. DAVIS Performed at [facility], [address], [place], [place] [address]   Lactic acid, plasma  Status: None   Collection Time: 09/06/22  4:33 AM  Result Value Ref Range   Lactic Acid, Venous 0.8 0.5 - 1.9 mmol/L    Comment: Performed at [facility], [address], [place], [place] [address]  Troponin I (High Sensitivity)     Status: None   Collection Time: 09/06/22  4:33 AM  Result Value Ref Range   Troponin I (High Sensitivity) 7 <18 ng/L    Comment: (NOTE) Elevated high sensitivity troponin I (hsTnI) values and significant  changes across serial measurements may suggest ACS but many other  chronic and acute conditions are known to elevate hsTnI results.  Refer to the "Links" section for chest pain algorithms and additional  guidance. Performed at [facility], [address] [place], [place] [address]   hCG, quantitative, pregnancy     Status: None   Collection Time: 09/06/22  4:33 AM  Result Value Ref Range   hCG, Beta Chain, Quant, S <1 <5 mIU/mL    Comment:          GEST. AGE      CONC.  (mIU/mL)   <=1 WEEK        5 - 50     2 WEEKS       50 - 500     3 WEEKS       100 - 10,000     4 WEEKS     1,000 - 30,000     5 WEEKS     3,500 - 115,000   6-8 WEEKS     12,000 - 270,000    12 WEEKS     15,000 - 220,000        FEMALE AND NON-PREGNANT FEMALE:     LESS THAN 5 mIU/mL Performed at [facility], [address] [place], [place] [address]   Phosphorus     Status: None   Collection Time: 09/06/22  4:33 AM  Result Value Ref Range   Phosphorus 4.5 2.5 - 4.6 mg/dL    Comment: Performed at [facility], [address] [place], [place] [address]  Lactic acid, plasma     Status: None   Collection Time: 09/06/22  6:35 AM  Result Value Ref  Range   Lactic Acid, Venous 0.8 0.5 - 1.9 mmol/L    Comment: Performed at [facility], [address], [place], [place] [address]  Urine rapid drug screen (hosp performed)     Status: Abnormal   Collection Time: 09/06/22  6:50 AM  Result Value Ref Range   Opiates POSITIVE (A) NONE DETECTED   Cocaine POSITIVE (A) NONE DETECTED   Benzodiazepines NONE DETECTED NONE DETECTED   Amphetamines NONE DETECTED NONE DETECTED   Tetrahydrocannabinol POSITIVE (A) NONE DETECTED   Barbiturates NONE DETECTED NONE DETECTED    Comment: (NOTE) DRUG SCREEN FOR MEDICAL PURPOSES ONLY.  IF CONFIRMATION IS NEEDED FOR ANY PURPOSE, NOTIFY LAB WITHIN 5 DAYS.  LOWEST DETECTABLE LIMITS FOR URINE DRUG SCREEN Drug Class                     Cutoff (ng/mL) Amphetamine and metabolites    1000 Barbiturate and metabolites    200 Benzodiazepine                 200 Opiates and metabolites        300 Cocaine and metabolites        300 THC  50 Performed at [facility], [address], [place], [place] [address]   Pregnancy, urine     Status: None   Collection Time: 09/06/22  6:50 AM  Result Value Ref Range   Preg Test, Ur NEGATIVE NEGATIVE    Comment:        THE SENSITIVITY OF THIS METHODOLOGY IS >25 mIU/mL. Performed at [facility], [address], [place], [place] [address]   HIV Antibody (routine testing w rflx)     Status: None   Collection Time: 09/06/22  9:49 AM  Result Value Ref Range   HIV Screen 4th Generation wRfx Non Reactive Non Reactive    Comment: Performed at [facility], [address], [place], [place] [address]  Basic metabolic panel     Status: Abnormal   Collection Time: 09/06/22 12:49 PM  Result Value Ref Range   Sodium 135 135 - 145 mmol/L   Potassium 3.2 (L) 3.5 - 5.1 mmol/L   Chloride 106 98 - 111 mmol/L   CO2 23 22 - 32 mmol/L   Glucose, Bld 90 70 - 99 mg/dL    Comment: Glucose reference  range applies only to samples taken after fasting for at least 8 hours.   BUN 8 6 - 20 mg/dL   Creatinine, Ser 9.14 0.44 - 1.00 mg/dL   Calcium 7.8 (L) 8.9 - 10.3 mg/dL   GFR, Estimated >78 >29 mL/min    Comment: (NOTE) Calculated using the CKD-EPI Creatinine Equation (2021)    Anion gap 6 5 - 15    Comment: Performed at [facility], [address], [place], [place] [address]  Hemoglobin and hematocrit, blood     Status: Abnormal   Collection Time: 09/06/22 12:49 PM  Result Value Ref Range   Hemoglobin 10.1 (L) 12.0 - 15.0 g/dL   HCT 08.6 (L) 57.8 - 46.9 %    Comment: Performed at [facility], [address], [place], [place] [address]  Type and screen [facility]     Status: None   Collection Time: 09/06/22 12:51 PM  Result Value Ref Range   ABO/RH(D) O NEG    Antibody Screen NEG    Sample Expiration      09/09/2022,2359 Performed at [facility], [address] [place], [place] [address]    CT Angio Chest/Abd/Pel for Dissection W and/or Wo Contrast  Result Date: 09/06/2022 CLINICAL DATA:  47 year old female with history of hypertension presenting with signs and symptoms of possible acute aortic syndrome. EXAM: CT ANGIOGRAPHY CHEST, ABDOMEN AND PELVIS TECHNIQUE: Non-contrast CT of the chest was initially obtained. Multidetector CT imaging through the chest, abdomen and pelvis was performed using the standard protocol during bolus administration of intravenous contrast. Multiplanar reconstructed images and MIPs were obtained and reviewed to evaluate the vascular anatomy. RADIATION DOSE REDUCTION: This exam was performed according to the departmental dose-optimization program which includes automated exposure control, adjustment of the mA and/or kV according to patient size and/or use of iterative reconstruction technique. CONTRAST:  OMNIPAQUE IOHEXOL 350 MG/ML SOLN COMPARISON:  Chest CTA 08/09/2014. CT of  the abdomen and pelvis 06/02/2015. FINDINGS: CTA CHEST FINDINGS Cardiovascular: Precontrast images demonstrate no crescentic high attenuation associated with the wall of the thoracic aorta to suggest the presence of acute intramural hemorrhage. Postcontrast images demonstrate no evidence of thoracic aortic aneurysm or dissection. Thoracic aorta is normal in caliber measuring 3.5 cm, 2.6 cm and 2.4 cm in  diameter in the ascending thoracic aorta, mid arch and descending thoracic aorta respectively. No atherosclerotic calcifications are noted in the thoracic aorta or the coronary arteries. Heart size is normal. There is no significant pericardial fluid, thickening or pericardial calcification. Mediastinum/Nodes: No pathologically enlarged mediastinal or hilar lymph nodes. Esophagus is unremarkable in appearance. No axillary lymphadenopathy. Lungs/Pleura: Mild centrilobular and paraseptal emphysema. Small calcified granuloma in the right upper lobe. No other suspicious appearing pulmonary nodules or masses are noted. No pneumothorax. No acute consolidative airspace disease. No pleural effusions. Musculoskeletal: There are no aggressive appearing lytic or blastic lesions noted in the visualized portions of the skeleton. Review of the MIP images confirms the above findings. CTA ABDOMEN AND PELVIS FINDINGS VASCULAR Aorta: Mild aortic atherosclerosis. Normal caliber aorta without aneurysm, dissection, vasculitis or significant stenosis. Celiac: Patent without evidence of aneurysm, dissection, vasculitis or significant stenosis. SMA: Patent without evidence of aneurysm, dissection, vasculitis or significant stenosis. Renals: Both renal arteries are patent without evidence of aneurysm, dissection, vasculitis, fibromuscular dysplasia or significant stenosis. IMA: Patent without evidence of aneurysm, dissection, vasculitis or significant stenosis. Inflow: Patent without evidence of aneurysm, dissection, vasculitis or  significant stenosis. Veins: No obvious venous abnormality within the limitations of this arterial phase study. Review of the MIP images confirms the above findings. NON-VASCULAR Hepatobiliary: No definite suspicious cystic or solid hepatic lesions are noted on today's arterial phase examination. Status post cholecystectomy. No intra or extrahepatic biliary ductal dilatation. Pancreas: No pancreatic mass. No pancreatic ductal dilatation. No pancreatic or peripancreatic fluid collections or inflammatory changes. Spleen: Unremarkable. Adrenals/Urinary Tract: Bilateral kidneys and adrenal glands are normal in appearance. No hydroureteronephrosis. Urinary bladder is normal in appearance. Stomach/Bowel: Stomach is unremarkable in appearance. No pathologic dilatation of small bowel or colon. The appendix is not confidently identified and may be surgically absent. Regardless, there are no inflammatory changes noted adjacent to the cecum to suggest the presence of an acute appendicitis at this time. Lymphatic: No lymphadenopathy noted in the abdomen or pelvis. Reproductive: Uterus and ovaries are unremarkable in appearance. Other: Small umbilical hernia containing omental fat. No significant volume of ascites. No pneumoperitoneum. Musculoskeletal: There are no aggressive appearing lytic or blastic lesions noted in the visualized portions of the skeleton. Review of the MIP images confirms the above findings. IMPRESSION: 1. No findings to suggest acute aortic syndrome. 2. No acute findings are noted in the abdomen or pelvis to account for the patient's symptoms. 3. Mild centrilobular and paraseptal emphysema. 4. Mild atherosclerosis of the abdominal aorta. 5. Small umbilical hernia containing only omental fat. No associated bowel incarceration or obstruction at this time. Electronically Signed   By: Trudie Reed M.D.   On: 09/06/2022 06:36   DG Chest 2 View  Result Date: 09/06/2022 CLINICAL DATA:  Chest pain. EXAM:  CHEST - 2 VIEW COMPARISON:  AP chest 07/12/2017 FINDINGS: There is mild cardiomegaly. No vascular congestion is seen. The mediastinum is normally outlined. The lungs are clear. There is age advanced degenerative disc disease at multiple thoracic spine levels spread no acute osseous findings. Two pericardial and stable chest. IMPRESSION: No active cardiopulmonary disease. Mild cardiomegaly. 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 Electronically Signed   By: Almira Bar M.D.   On: 09/06/2022 04:29    Pending Labs Unresulted Labs (From admission, onward)     Start     Ordered   09/07/22 0500  CBC with Differential/Platelet  Tomorrow morning,   R        09/06/22  1043   09/06/22 1300  Hemoglobin and hematocrit, blood  Every 6 hours,   R (with TIMED occurrences)      09/06/22 1044            Vitals/Pain Today's Vitals   09/06/22 1100 09/06/22 1102 09/06/22 1415 09/06/22 1420  BP: (!) 194/110  (!) 140/82   Pulse: 74  74   Resp: 18  17   Temp:  98.5 F (36.9 C)    TempSrc:  Oral    SpO2: 99%  100%   PainSc:    Asleep    Isolation Precautions No active isolations  Medications Medications  potassium chloride 10 mEq in 100 mL IVPB (0 mEq Intravenous Stopped 09/06/22 0847)  pantoprozole (PROTONIX) 80 mg /NS 100 mL infusion (8 mg/hr Intravenous New Bag/Given 09/06/22 1127)  pantoprazole (PROTONIX) injection 40 mg (has no administration in time range)  potassium chloride 10 mEq in 100 mL IVPB (10 mEq Intravenous New Bag/Given 09/06/22 1000)  0.9 % NaCl with KCl 40 mEq / L  infusion (has no administration in time range)  HYDROmorphone (DILAUDID) injection 1 mg (has no administration in time range)  hydrALAZINE (APRESOLINE) injection 10 mg (has no administration in time range)  ondansetron (ZOFRAN) injection 4 mg (4 mg Intravenous Given 09/06/22 0315)  HYDROmorphone (DILAUDID) injection 1 mg (1 mg Intravenous Given 09/06/22 0315)  sodium chloride 0.9 % bolus 1,000 mL  (1,000 mLs Intravenous Bolus 09/06/22 0317)  HYDROmorphone (DILAUDID) injection 1 mg (1 mg Intravenous Given 09/06/22 0428)  iohexol (OMNIPAQUE) 350 MG/ML injection 100 mL (100 mLs Intravenous Contrast Given 09/06/22 0557)  hydrALAZINE (APRESOLINE) injection 5 mg (5 mg Intravenous Given 09/06/22 0551)  HYDROmorphone (DILAUDID) injection 1 mg (1 mg Intravenous Given 09/06/22 0634)  pantoprazole (PROTONIX) injection 40 mg (40 mg Intravenous Given 09/06/22 0738)  prochlorperazine (COMPAZINE) injection 10 mg (10 mg Intravenous Given 09/06/22 0738)  pantoprazole (PROTONIX) injection 40 mg (40 mg Intravenous Given 09/06/22 0952)  magnesium sulfate IVPB 2 g 50 mL (0 g Intravenous Stopped 09/06/22 1204)    Mobility walks     Focused Assessments Neuro Assessment Handoff:  Swallow screen pass?  N/A         Neuro Assessment: Within Defined Limits Neuro Checks:      Has TPA been given? No If patient is a Neuro Trauma and patient is going to OR before floor call report to 4N Charge nurse: [phone] or [phone]   R Recommendations: See Admitting Provider Note  Report given to:   Additional Notes: Patient has infusions that need to be ran but she refuses second access and keeps bending arm with current infusions going.

## 2022-09-06 NOTE — ED Notes (Addendum)
Pt was able to ambulate to restroom with little bit of assistant was able to give urine sample.

## 2022-09-06 NOTE — ED Provider Notes (Signed)
[place] EMERGENCY DEPARTMENT AT [facility] Provider Note   CSN: [id number] Arrival date & time: 09/06/22  0231     History  Chief Complaint  Patient presents with   Abdominal Pain    [name] is a 47 y.o. female.  HPI   Patient with medical history including polysubstance use, GERD, hypertension, duodenal ulcer, presenting with complaints of abdominal pain.  Patient states that started about 2 days ago, states it is in her epigastric/right upper quadrant area, does not radiate, associated nausea and vomiting, endorses hematemesis, still passing gas having normal bowel movements denies any bloody stools or dark tarry stools no associate urinary symptoms.  Admits to cocaine use, denies any alcohol use, states she has some slight chest pain, substernal, no pleuritic chest pain or shortness of breath, no cardiac history, no history PEs or DVTs, currently not on oral birth control.  Patient states that she has had cholecystectomy as well as appendectomy, no other significant abdominal surgeries.  No associate fevers chills cough congestion general body aches.    Home Medications Prior to Admission medications   Medication Sig Start Date End Date Taking? Authorizing Provider  Belladonna Alk-Phenobarbital (BELLADONNA ALK-PHENOBARB ER PO) Take 1 tablet by mouth as needed (IBS).    [provider]  Bismuth Subsalicylate 262 MG TABS Take 2 tablets (524 mg total) by mouth 4 (four) times daily. 06/07/15   Clydia Llano, MD  doxycycline (VIBRAMYCIN) 100 MG capsule Take 1 capsule (100 mg total) by mouth 2 (two) times daily. 05/27/21   Roxy Horseman, PA-C  hydrochlorothiazide (HYDRODIURIL) 25 MG tablet Take 25 mg by mouth daily.    [provider]      Allergies    Hydrocodone    Review of Systems   Review of Systems  Constitutional:  Negative for chills and fever.  Respiratory:  Negative for shortness of breath.   Cardiovascular:  Negative for chest  pain.  Gastrointestinal:  Positive for abdominal pain, nausea and vomiting. Negative for diarrhea.  Neurological:  Negative for headaches.    Physical Exam Updated Vital Signs BP (!) 178/95   Pulse 62   Temp 98 F (36.7 C)   Resp 12   LMP  (LMP Unknown) Comment: negative HCG quantitative 09/06/22  SpO2 98%  Physical Exam Vitals and nursing note reviewed.  Constitutional:      General: She is not in acute distress.    Appearance: She is not ill-appearing.  HENT:     Head: Normocephalic and atraumatic.     Nose: No congestion.  Eyes:     Conjunctiva/sclera: Conjunctivae normal.  Cardiovascular:     Rate and Rhythm: Normal rate and regular rhythm.     Pulses: Normal pulses.     Heart sounds: No murmur heard.    No friction rub. No gallop.  Pulmonary:     Effort: No respiratory distress.     Breath sounds: No wheezing, rhonchi or rales.  Abdominal:     Palpations: Abdomen is soft.     Tenderness: There is abdominal tenderness. There is no right CVA tenderness or left CVA tenderness.     Hernia: A hernia is present.     Comments: Abdomen nondistended, soft, patient has generalized abdominal tenderness, but tends to be worse in the epigastric region, there is a noted ventral hernia, no guarding rebound tenderness or peritoneal sign, does have some left-sided flank tenderness without CVA tenderness.  Musculoskeletal:     Right lower leg: No  edema.     Left lower leg: No edema.  Skin:    General: Skin is warm and dry.  Neurological:     Mental Status: She is alert.  Psychiatric:        Mood and Affect: Mood normal.     ED Results / Procedures / Treatments   Labs (all labs ordered are listed, but only abnormal results are displayed) Labs Reviewed  CBC WITH DIFFERENTIAL/PLATELET - Abnormal; Notable for the following components:      Result Value   WBC 11.9 (*)    Hemoglobin 11.2 (*)    HCT 34.5 (*)    RDW 15.8 (*)    Neutro Abs 9.5 (*)    All other components within  normal limits  COMPREHENSIVE METABOLIC PANEL - Abnormal; Notable for the following components:   Potassium <2.0 (*)    Calcium 6.8 (*)    Total Protein 6.1 (*)    Albumin 2.9 (*)    AST 13 (*)    All other components within normal limits  POTASSIUM - Abnormal; Notable for the following components:   Potassium 2.7 (*)    All other components within normal limits  LIPASE, BLOOD  MAGNESIUM  LACTIC ACID, PLASMA  HCG, QUANTITATIVE, PREGNANCY  RAPID URINE DRUG SCREEN, HOSP PERFORMED  PREGNANCY, URINE  LACTIC ACID, PLASMA  TROPONIN I (HIGH SENSITIVITY)  TROPONIN I (HIGH SENSITIVITY)    EKG EKG Interpretation Date/Time:  Friday September 06 2022 03:16:08 EDT Ventricular Rate:  95 PR Interval:  140 QRS Duration:  79 QT Interval:  382 QTC Calculation: 481 R Axis:   64  Text Interpretation: Sinus rhythm Consider left ventricular hypertrophy No significant change was found Confirmed by Glynn Octave [phone]) on 09/06/2022 3:17:55 AM  Radiology CT Angio Chest/Abd/Pel for Dissection W and/or Wo Contrast  Result Date: 09/06/2022 CLINICAL DATA:  47 year old female with history of hypertension presenting with signs and symptoms of possible acute aortic syndrome. EXAM: CT ANGIOGRAPHY CHEST, ABDOMEN AND PELVIS TECHNIQUE: Non-contrast CT of the chest was initially obtained. Multidetector CT imaging through the chest, abdomen and pelvis was performed using the standard protocol during bolus administration of intravenous contrast. Multiplanar reconstructed images and MIPs were obtained and reviewed to evaluate the vascular anatomy. RADIATION DOSE REDUCTION: This exam was performed according to the departmental dose-optimization program which includes automated exposure control, adjustment of the mA and/or kV according to patient size and/or use of iterative reconstruction technique. CONTRAST:  OMNIPAQUE IOHEXOL 350 MG/ML SOLN COMPARISON:  Chest CTA 08/09/2014. CT of the abdomen and pelvis  06/02/2015. FINDINGS: CTA CHEST FINDINGS Cardiovascular: Precontrast images demonstrate no crescentic high attenuation associated with the wall of the thoracic aorta to suggest the presence of acute intramural hemorrhage. Postcontrast images demonstrate no evidence of thoracic aortic aneurysm or dissection. Thoracic aorta is normal in caliber measuring 3.5 cm, 2.6 cm and 2.4 cm in diameter in the ascending thoracic aorta, mid arch and descending thoracic aorta respectively. No atherosclerotic calcifications are noted in the thoracic aorta or the coronary arteries. Heart size is normal. There is no significant pericardial fluid, thickening or pericardial calcification. Mediastinum/Nodes: No pathologically enlarged mediastinal or hilar lymph nodes. Esophagus is unremarkable in appearance. No axillary lymphadenopathy. Lungs/Pleura: Mild centrilobular and paraseptal emphysema. Small calcified granuloma in the right upper lobe. No other suspicious appearing pulmonary nodules or masses are noted. No pneumothorax. No acute consolidative airspace disease. No pleural effusions. Musculoskeletal: There are no aggressive appearing lytic or blastic lesions noted in the visualized  portions of the skeleton. Review of the MIP images confirms the above findings. CTA ABDOMEN AND PELVIS FINDINGS VASCULAR Aorta: Mild aortic atherosclerosis. Normal caliber aorta without aneurysm, dissection, vasculitis or significant stenosis. Celiac: Patent without evidence of aneurysm, dissection, vasculitis or significant stenosis. SMA: Patent without evidence of aneurysm, dissection, vasculitis or significant stenosis. Renals: Both renal arteries are patent without evidence of aneurysm, dissection, vasculitis, fibromuscular dysplasia or significant stenosis. IMA: Patent without evidence of aneurysm, dissection, vasculitis or significant stenosis. Inflow: Patent without evidence of aneurysm, dissection, vasculitis or significant stenosis. Veins: No  obvious venous abnormality within the limitations of this arterial phase study. Review of the MIP images confirms the above findings. NON-VASCULAR Hepatobiliary: No definite suspicious cystic or solid hepatic lesions are noted on today's arterial phase examination. Status post cholecystectomy. No intra or extrahepatic biliary ductal dilatation. Pancreas: No pancreatic mass. No pancreatic ductal dilatation. No pancreatic or peripancreatic fluid collections or inflammatory changes. Spleen: Unremarkable. Adrenals/Urinary Tract: Bilateral kidneys and adrenal glands are normal in appearance. No hydroureteronephrosis. Urinary bladder is normal in appearance. Stomach/Bowel: Stomach is unremarkable in appearance. No pathologic dilatation of small bowel or colon. The appendix is not confidently identified and may be surgically absent. Regardless, there are no inflammatory changes noted adjacent to the cecum to suggest the presence of an acute appendicitis at this time. Lymphatic: No lymphadenopathy noted in the abdomen or pelvis. Reproductive: Uterus and ovaries are unremarkable in appearance. Other: Small umbilical hernia containing omental fat. No significant volume of ascites. No pneumoperitoneum. Musculoskeletal: There are no aggressive appearing lytic or blastic lesions noted in the visualized portions of the skeleton. Review of the MIP images confirms the above findings. IMPRESSION: 1. No findings to suggest acute aortic syndrome. 2. No acute findings are noted in the abdomen or pelvis to account for the patient's symptoms. 3. Mild centrilobular and paraseptal emphysema. 4. Mild atherosclerosis of the abdominal aorta. 5. Small umbilical hernia containing only omental fat. No associated bowel incarceration or obstruction at this time. Electronically Signed   By: Trudie Reed M.D.   On: 09/06/2022 06:36   DG Chest 2 View  Result Date: 09/06/2022 CLINICAL DATA:  Chest pain. EXAM: CHEST - 2 VIEW COMPARISON:  AP  chest 07/12/2017 FINDINGS: There is mild cardiomegaly. No vascular congestion is seen. The mediastinum is normally outlined. The lungs are clear. There is age advanced degenerative disc disease at multiple thoracic spine levels spread no acute osseous findings. Two pericardial and stable chest. IMPRESSION: No active cardiopulmonary disease. Mild cardiomegaly. 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 Electronically Signed   By: Almira Bar M.D.   On: 09/06/2022 04:29    Procedures Procedures    Medications Ordered in ED Medications  potassium chloride 10 mEq in 100 mL IVPB (10 mEq Intravenous New Bag/Given 09/06/22 0552)  pantoprazole (PROTONIX) injection 40 mg (has no administration in time range)  ondansetron (ZOFRAN) injection 4 mg (4 mg Intravenous Given 09/06/22 0315)  HYDROmorphone (DILAUDID) injection 1 mg (1 mg Intravenous Given 09/06/22 0315)  sodium chloride 0.9 % bolus 1,000 mL (1,000 mLs Intravenous Bolus 09/06/22 0317)  HYDROmorphone (DILAUDID) injection 1 mg (1 mg Intravenous Given 09/06/22 0428)  iohexol (OMNIPAQUE) 350 MG/ML injection 100 mL (100 mLs Intravenous Contrast Given 09/06/22 0557)  hydrALAZINE (APRESOLINE) injection 5 mg (5 mg Intravenous Given 09/06/22 0551)  HYDROmorphone (DILAUDID) injection 1 mg (1 mg Intravenous Given 09/06/22 1610)    ED Course/ Medical Decision Making/ A&P  Medical Decision Making Amount and/or Complexity of Data Reviewed Labs: ordered. Radiology: ordered.  Risk Prescription drug management.   This patient presents to the ED for concern of abdominal pain, this involves an extensive number of treatment options, and is a complaint that carries with it a high risk of complications and morbidity.  The differential diagnosis includes ruptured stomach ulcer, PE, ACS, dissection, pancreatitis, acid reflux    Additional history obtained:  Additional history obtained from N/A External records  from outside source obtained and reviewed including recent ER notes   Co morbidities that complicate the patient evaluation  Duodenal ulcer  Social Determinants of Health:  Polysubstance use    Lab Tests:  I Ordered, and personally interpreted labs.  The pertinent results include: CBC shows leukocytosis 11.9, normocytic anemia hemoglobin 11.2, CMP reveals potassium less than 2, calcium 6.8, albumin 2.9, AST 13, lactic 0.8, magnesium 1.9, lipase 26, for troponin 6,   Imaging Studies ordered:  I ordered imaging studies including chest x-ray, dissection study I independently visualized and interpreted imaging which showed CTA negative for dissection or aneurysm, does note a fat containing hernia. I agree with the radiologist interpretation   Cardiac Monitoring:  The patient was maintained on a cardiac monitor.  I personally viewed and interpreted the cardiac monitored which showed an underlying rhythm of: EKG without signs of ischemia   Medicines ordered and prescription drug management:  I ordered medication including fluids, pain medication, antiemetics I have reviewed the patients home medicines and have made adjustments as needed  Critical Interventions:  N/A   Reevaluation:  Presents with epigastric pain there is a noted mass or suspect is likely a hernia, also noticing some chest pain, will obtain screening lab workup, provide with pain medication fluids, antiemetics and continue to monitor.  Patient's potassium is critically low at less than 2, and concern for possible erroneous lab result, will be repeat potassium.  Repeat potassium is 2.7, reassessed the patient she is still endorsing significant pain, will provide with pain medications, due to her elevated blood pressure and pain that is epigastric and goes into her chest will obtain CT dissection study for all possible dissection.   Consultations Obtained:  N/A    Test Considered:  N/A    Rule  out I have low suspicion for ACS as history is atypical, patient has no cardiac history, EKG was sinus rhythm without signs of ischemia, patient had negative delta troponin.  Suspicion for intracranial bleed low at this time no endorsing headaches or change in vision, she has no focal deficit present my exam.  I doubt PE or dissection CTA is negative these findings.  Suspicion for cholecystitis or choledocholithiasis low at this time as she has no right upper quadrant tenderness, no elevation liver signs alk phos or T. bili.  I doubt pancreatitis lipase is within normal limits.    Dispostion and problem list  Due to shift change patient will be handed off to [facility]   Admit to medicine for observation, she endorses hematemesis has history of duodenal ulcers, would benefit from GI consultation and continued pain management.              Final Clinical Impression(s) / ED Diagnoses Final diagnoses:  Epigastric pain  Hematemesis without nausea  Hypokalemia    Rx / DC Orders ED Discharge Orders     None         Carroll Sage, PA-C 09/06/22 [id number]    Glynn Octave, MD 09/07/22  0030  

## 2022-09-06 NOTE — ED Notes (Signed)
Patient has one IV access and refused another access at this time. She is also asking for more pain medications but is too drowsy and can not stay awake long enough to answer questions. RN will hold PRN pain med at this time, patient currently sleep.

## 2022-09-06 NOTE — ED Provider Notes (Addendum)
Signout received on this 47 year old female who presented with epigastric abdominal pain.  She has history of duodenal ulcers.  Reports hematemesis over the past 2 days.  Reports total of 3 episodes.  Also endorses melanotic stools but associates this with Pepto-Bismol.  Does endorse using Goody powders within the past week.  Denies alcohol abuse.  Does have history of cocaine abuse.  Has history of GI bleed from duodenal ulcer in 2017.  She also underwent endoscopy at that time.  At the time of signout patient is awaiting discussion with gastroenterology and admission to medicine.  Physical Exam  BP (!) 164/84   Pulse 77   Temp 98.3 F (36.8 C) (Oral)   Resp 18   LMP  (LMP Unknown) Comment: negative HCG quantitative 09/06/22  SpO2 98%     Procedures  Procedures  ED Course / MDM    Medical Decision Making Amount and/or Complexity of Data Reviewed Labs: ordered. Radiology: ordered.  Risk Prescription drug management. Decision regarding hospitalization.   Gastroenterology (Dr. Ewing Schlein) notified.  Will discuss with hospitalist.  Discussed with hospitalist.  They will evaluate patient for admission.       Marita Kansas, PA-C 09/06/22 0834    Marita Kansas, PA-C 09/06/22 1610    Derwood Kaplan, MD 09/06/22 [phone]

## 2022-09-06 NOTE — ED Triage Notes (Signed)
PT. BIB [facility] for abd. Pain, N&V. Pt. Has hx of stomach cancer, VSS with EMS.

## 2022-09-06 NOTE — ED Notes (Signed)
RN asked pt for urine. Pt stated she can not give any at this time.

## 2022-09-06 NOTE — Progress Notes (Signed)
Pt arrived to 1443. Unable to complete full admission at this time due to pt being extremely drowsy, difficult to keep pt awake to answer questions. Pt falling asleep mid-sentence.

## 2022-09-06 NOTE — H&P (Signed)
History and Physical    Patient: [name] ZOX:[id number] DOB: [birth year] DOA: 09/06/2022 DOS: the patient was seen and examined on 09/06/2022 PCP: Patient, No Pcp Per  Patient coming from: Home  Chief Complaint:  Chief Complaint  Patient presents with   Abdominal Pain   HPI: FLORECE SPIESS is a 47 y.o. female with medical history significant of history of esophagitis, GERD, GI bleed due to duodenal ulcer with ABLA, depression, hypertension, obesity, polysubstance abuse who presented to the emergency department complaints of progressively worse abdominal pain for the past 2 days associated with multiple episodes of nausea and emesis, including some episodes with hematemesis.  She has been using Goody powders recently.  She has had some melanotic stools, but stated that she also took Pepto-Bismol.  No diarrhea, constipation, melena or hematochezia.  No flank pain, dysuria, frequency or hematuria. She denied fever, chills, rhinorrhea, sore throat, wheezing or hemoptysis.  No chest pain, palpitations, diaphoresis, PND, orthopnea or pitting edema of the lower extremities.  No polyuria, polydipsia, polyphagia or blurred vision.   Lab work: UDS is positive for opiates, cocaine and THC.  CBC showed a white count of 11.9, hemoglobin 11.2 g/dL platelets 811.  Lactic acid 0.8 mmol/L x 2.  Urine and serum pregnancy test were negative.  Troponin x 2 and lipase were normal.  CMP showed a potassium less than 2.0 mmol/L, calcium 6.8 mg/dL.  AST 13 units/L, total protein 6.1 and albumin 2.9 g/dL.  The rest of the CMP measurements were normal.  Repeat potassium level 2.7 mmol/L.  Magnesium was 1.9 and phosphorus 4.5 mg/dL.  Imaging: 2 view chest radiograph with mild cardiomegaly with no active cardiopulmonary disease.  CTA chest/abdomen/pelvis with no findings to suggest acute aortic syndrome.  No acute findings in the abdomen or pelvis to account for the patient's symptoms.  Mild centrilobular and  paraseptal emphysema.  Mild aortic atherosclerosis of the abdominal aorta.  Small umbilical hernia containing only omental fat.  No associated bowel incarceration or obstruction at this time.  ED course: Initial vital signs were temperature 98 F, pulse 99, respiration 18, BP 176/109 mmHg O2 sat 100% on room air.  The patient received hydromorphone 1 mg IVP x 3, hydralazine 5 mg IVP, ondansetron 4 mg IVP, Protonix 40 mg IVP, prochlorperazine 10 mg IVP, KCl 10 mEq IVPB x 4 and 1000 mL of normal saline bolus.   Review of Systems: As mentioned in the history of present illness. All other systems reviewed and are negative. Past Medical History:  Diagnosis Date   Anemia 05/2015   due to blood loss from duodenal ulcer.  transfused 3 PRBCs.    Chronic abdominal pain 2008   ? IBS.  multiple CT scans: enlarged uterus.   Depression    with mood swings   Duodenal ulcer 05/2015   Esophagitis 2011   GERD (gastroesophageal reflux disease)    Hypertension    Obesity 2011   Polysubstance abuse (HCC) 09/28/2010   cocaine, opiates, THC, tobacco.      Past Surgical History:  Procedure Laterality Date   CHOLECYSTECTOMY  2004   ESOPHAGOGASTRODUODENOSCOPY N/A 06/04/2015   Procedure: ESOPHAGOGASTRODUODENOSCOPY (EGD);  Surgeon: Meryl Dare, MD;  Location: [facility];  Service: Endoscopy;  Laterality: N/A;   LAPAROSCOPIC APPENDECTOMY  11/2006   Dr Abbey Chatters   TONSILLECTOMY     TUBAL LIGATION     Social History:  reports that she has been smoking cigarettes. She has been smoking an average of .3 packs  per day. She has never used smokeless tobacco. She reports that she does not drink alcohol and does not use drugs.  Allergies  Allergen Reactions   Hydrocodone Nausea And Vomiting    Family History  Problem Relation Age of Onset   Hypertension Mother    Heart disease Father 17       MI   Stroke Father 87   Diabetes Father    GER disease Sister    Cancer Maternal Grandmother        uterine and  stomach    Prior to Admission medications   Medication Sig Start Date End Date Taking? Authorizing Provider  Belladonna Alk-Phenobarbital (BELLADONNA ALK-PHENOBARB ER PO) Take 1 tablet by mouth as needed (IBS).    [provider]  Bismuth Subsalicylate 262 MG TABS Take 2 tablets (524 mg total) by mouth 4 (four) times daily. 06/07/15   Clydia Llano, MD  doxycycline (VIBRAMYCIN) 100 MG capsule Take 1 capsule (100 mg total) by mouth 2 (two) times daily. 05/27/21   Roxy Horseman, PA-C  hydrochlorothiazide (HYDRODIURIL) 25 MG tablet Take 25 mg by mouth daily.    [provider]    Physical Exam: Vitals:   09/06/22 0981 09/06/22 0650 09/06/22 0653 09/06/22 0655  BP: (!) 178/95   (!) 164/84  Pulse:  67  77  Resp:    18  Temp:   98.3 F (36.8 C)   TempSrc:   Oral   SpO2:  100%  98%   Physical Exam Vitals and nursing note reviewed.  Constitutional:      General: She is awake. She is not in acute distress.    Appearance: She is normal weight.  HENT:     Head: Normocephalic.     Mouth/Throat:     Mouth: Mucous membranes are dry.  Eyes:     General: No scleral icterus.    Pupils: Pupils are equal, round, and reactive to light.  Neck:     Vascular: No JVD.  Cardiovascular:     Rate and Rhythm: Normal rate and regular rhythm.     Heart sounds: S1 normal and S2 normal.  Pulmonary:     Effort: Pulmonary effort is normal.     Breath sounds: Normal breath sounds.  Abdominal:     Palpations: Abdomen is soft.     Tenderness: There is abdominal tenderness in the epigastric area and periumbilical area. There is no right CVA tenderness, left CVA tenderness, guarding or rebound.  Musculoskeletal:     Cervical back: Neck supple.     Right lower leg: No edema.     Left lower leg: No edema.  Skin:    General: Skin is warm and dry.  Neurological:     General: No focal deficit present.     Mental Status: She is alert and oriented to person, place, and time.  Psychiatric:         Mood and Affect: Mood normal.        Behavior: Behavior normal. Behavior is cooperative.   Data Reviewed:  Results are pending, will review when available.  Assessment and Plan: Principal Problem:   Acute blood loss anemia In the setting of:   Hematemesis Admit to stepdown/inpatient. Keep NPO for now. Continue IV fluids. Continue pantoprazole infusion. Monitor H&H. Transfuse as needed. GI consult has been requested.  Active Problems:   Chronic abdominal pain Associated with:   Nausea and vomiting Continue IV fluids. Keep n.p.o. for now. Analgesics as needed. Antiemetics as  needed.    GERD (gastroesophageal reflux disease) On IV pantoprazole. Antiacid, H2 blocker, viscous lidocaine or PPI as needed.    Hypertension Currently not on antihypertensives. Hydralazine 10 mg IVP as needed.    Tobacco abuse Tobacco cessation advised. Nicotine replacement therapy declined for now. Will order NicoDerm as needed    Cocaine abuse (HCC) Cessation advised. TOC team consulted.      Advance Care Planning:   Code Status: Full Code   Consults: [facility].  Family Communication:   Severity of Illness: The appropriate patient status for this patient is OBSERVATION. Observation status is judged to be reasonable and necessary in order to provide the required intensity of service to ensure the patient's safety. The patient's presenting symptoms, physical exam findings, and initial radiographic and laboratory data in the context of their medical condition is felt to place them at decreased risk for further clinical deterioration. Furthermore, it is anticipated that the patient will be medically stable for discharge from the hospital within 2 midnights of admission.   Author: Bobette Mo, MD 09/06/2022 8:40 AM  For on call review [email or web address].   This document was prepared using Dragon voice recognition software and may contain some unintended transcription  errors.

## 2022-09-07 DIAGNOSIS — Z7151 Drug abuse counseling and surveillance of drug abuser: Secondary | ICD-10-CM | POA: Diagnosis not present

## 2022-09-07 DIAGNOSIS — G8929 Other chronic pain: Secondary | ICD-10-CM | POA: Diagnosis present

## 2022-09-07 DIAGNOSIS — E876 Hypokalemia: Secondary | ICD-10-CM

## 2022-09-07 DIAGNOSIS — K449 Diaphragmatic hernia without obstruction or gangrene: Secondary | ICD-10-CM | POA: Diagnosis present

## 2022-09-07 DIAGNOSIS — K2100 Gastro-esophageal reflux disease with esophagitis, without bleeding: Secondary | ICD-10-CM | POA: Diagnosis present

## 2022-09-07 DIAGNOSIS — F1410 Cocaine abuse, uncomplicated: Secondary | ICD-10-CM | POA: Diagnosis present

## 2022-09-07 DIAGNOSIS — K222 Esophageal obstruction: Secondary | ICD-10-CM | POA: Diagnosis present

## 2022-09-07 DIAGNOSIS — K921 Melena: Secondary | ICD-10-CM | POA: Diagnosis present

## 2022-09-07 DIAGNOSIS — Z716 Tobacco abuse counseling: Secondary | ICD-10-CM | POA: Diagnosis not present

## 2022-09-07 DIAGNOSIS — Z9049 Acquired absence of other specified parts of digestive tract: Secondary | ICD-10-CM | POA: Diagnosis not present

## 2022-09-07 DIAGNOSIS — K589 Irritable bowel syndrome without diarrhea: Secondary | ICD-10-CM | POA: Diagnosis present

## 2022-09-07 DIAGNOSIS — R112 Nausea with vomiting, unspecified: Secondary | ICD-10-CM | POA: Diagnosis present

## 2022-09-07 DIAGNOSIS — Z79899 Other long term (current) drug therapy: Secondary | ICD-10-CM | POA: Diagnosis not present

## 2022-09-07 DIAGNOSIS — E669 Obesity, unspecified: Secondary | ICD-10-CM | POA: Diagnosis present

## 2022-09-07 DIAGNOSIS — Z8249 Family history of ischemic heart disease and other diseases of the circulatory system: Secondary | ICD-10-CM | POA: Diagnosis not present

## 2022-09-07 DIAGNOSIS — F32A Depression, unspecified: Secondary | ICD-10-CM | POA: Diagnosis present

## 2022-09-07 DIAGNOSIS — F17210 Nicotine dependence, cigarettes, uncomplicated: Secondary | ICD-10-CM | POA: Diagnosis present

## 2022-09-07 DIAGNOSIS — Z885 Allergy status to narcotic agent status: Secondary | ICD-10-CM | POA: Diagnosis not present

## 2022-09-07 DIAGNOSIS — K429 Umbilical hernia without obstruction or gangrene: Secondary | ICD-10-CM | POA: Diagnosis present

## 2022-09-07 DIAGNOSIS — F1210 Cannabis abuse, uncomplicated: Secondary | ICD-10-CM | POA: Diagnosis present

## 2022-09-07 DIAGNOSIS — K920 Hematemesis: Secondary | ICD-10-CM | POA: Diagnosis present

## 2022-09-07 DIAGNOSIS — Z791 Long term (current) use of non-steroidal anti-inflammatories (NSAID): Secondary | ICD-10-CM | POA: Diagnosis not present

## 2022-09-07 DIAGNOSIS — D649 Anemia, unspecified: Secondary | ICD-10-CM | POA: Diagnosis present

## 2022-09-07 DIAGNOSIS — K250 Acute gastric ulcer with hemorrhage: Secondary | ICD-10-CM | POA: Diagnosis present

## 2022-09-07 DIAGNOSIS — I10 Essential (primary) hypertension: Secondary | ICD-10-CM | POA: Diagnosis present

## 2022-09-07 LAB — CBC WITH DIFFERENTIAL/PLATELET
Abs Immature Granulocytes: 0.03 10*3/uL (ref 0.00–0.07)
Basophils Absolute: 0 10*3/uL (ref 0.0–0.1)
Basophils Relative: 1 %
Eosinophils Absolute: 0.1 10*3/uL (ref 0.0–0.5)
Eosinophils Relative: 2 %
HCT: 34.6 % — ABNORMAL LOW (ref 36.0–46.0)
Hemoglobin: 10.8 g/dL — ABNORMAL LOW (ref 12.0–15.0)
Immature Granulocytes: 0 %
Lymphocytes Relative: 25 %
Lymphs Abs: 2.1 10*3/uL (ref 0.7–4.0)
MCH: 28.6 pg (ref 26.0–34.0)
MCHC: 31.2 g/dL (ref 30.0–36.0)
MCV: 91.8 fL (ref 80.0–100.0)
Monocytes Absolute: 0.7 10*3/uL (ref 0.1–1.0)
Monocytes Relative: 8 %
Neutro Abs: 5.7 10*3/uL (ref 1.7–7.7)
Neutrophils Relative %: 64 %
Platelets: 394 10*3/uL (ref 150–400)
RBC: 3.77 MIL/uL — ABNORMAL LOW (ref 3.87–5.11)
RDW: 15.9 % — ABNORMAL HIGH (ref 11.5–15.5)
WBC: 8.7 10*3/uL (ref 4.0–10.5)
nRBC: 0 % (ref 0.0–0.2)

## 2022-09-07 LAB — HEMOGLOBIN AND HEMATOCRIT, BLOOD
HCT: 34.3 % — ABNORMAL LOW (ref 36.0–46.0)
HCT: 36.6 % (ref 36.0–46.0)
HCT: 36.5 % (ref 36.0–46.0)
Hemoglobin: 10.8 g/dL — ABNORMAL LOW (ref 12.0–15.0)
Hemoglobin: 11.5 g/dL — ABNORMAL LOW (ref 12.0–15.0)
Hemoglobin: 11.5 g/dL — ABNORMAL LOW (ref 12.0–15.0)

## 2022-09-07 LAB — BASIC METABOLIC PANEL WITH GFR
Anion gap: 4 — ABNORMAL LOW (ref 5–15)
BUN: <5 mg/dL — ABNORMAL LOW (ref 6–20)
CO2: 23 mmol/L (ref 22–32)
Calcium: 8.1 mg/dL — ABNORMAL LOW (ref 8.9–10.3)
Chloride: 109 mmol/L (ref 98–111)
Creatinine, Ser: 0.43 mg/dL — ABNORMAL LOW (ref 0.44–1.00)
GFR, Estimated: >60 mL/min (ref >60)
Glucose, Bld: 81 mg/dL (ref 70–99)
Potassium: 3.2 mmol/L — ABNORMAL LOW (ref 3.5–5.1)
Sodium: 136 mmol/L (ref 135–145)

## 2022-09-07 MED ORDER — POTASSIUM CHLORIDE CRYS ER 20 MEQ PO TBCR
40.0000 meq | EXTENDED_RELEASE_TABLET | ORAL | Status: AC
  Administered 2022-09-07 (×2): 40 meq via ORAL
  Filled 2022-09-07 (×2): qty 2

## 2022-09-07 MED ORDER — HYDRALAZINE HCL 20 MG/ML IJ SOLN
10.0000 mg | INTRAMUSCULAR | Status: DC | PRN
  Administered 2022-09-08: 10 mg via INTRAVENOUS
  Filled 2022-09-07: qty 1

## 2022-09-07 MED ORDER — HYDROMORPHONE HCL 1 MG/ML IJ SOLN
1.0000 mg | INTRAMUSCULAR | Status: DC | PRN
  Administered 2022-09-07 – 2022-09-08 (×3): 1 mg via INTRAVENOUS
  Filled 2022-09-07 (×3): qty 1

## 2022-09-07 MED ORDER — ACETAMINOPHEN 500 MG PO TABS: 1000.0000 mg | ORAL_TABLET | Freq: Three times a day (TID) | ORAL | Status: DC | PRN

## 2022-09-07 NOTE — Progress Notes (Signed)
PROGRESS NOTE   [name]  ZOX:[id number]    DOB: 03-25-1975    DOA: 09/06/2022  PCP: Patient, No Pcp Per   I have briefly reviewed patients previous medical [facility] in [facility].  Chief Complaint  Patient presents with   Abdominal Pain    Brief Narrative:  47 year old female with medical history significant for hypertension, GERD, esophagitis, duodenal ulcer, polysubstance abuse (cocaine, opiates, THC, tobacco) acute blood loss anemia from GI bleed, depression, presented to the ED with complaints of progressive abdominal pain x 2 days with associated nausea, vomiting, some episodes of hematemesis and melena.  She has been using Goody's powder lately but also took Pepto-Bismol.  Admitted for suspected acute upper GI bleed due to PUD.  [facility] consulted, plan EGD 6/30.   Assessment & Plan:  Principal Problem:   Hematemesis Active Problems:   GERD (gastroesophageal reflux disease)   Hypertension   Tobacco abuse   Chronic abdominal pain   Cocaine abuse (HCC)   Acute blood loss anemia   Nausea and vomiting   Acute upper GI bleed: Given prior history of duodenal ulcer that required intervention, ongoing NSAID and polysubstance abuse, could have recurrent ulceration versus other diagnoses CTA C/A/P: No acute findings. Continue PPI infusion [facility] input appreciated, plan clear liquid diet today, n.p.o. after midnight tonight for EGD on 6/30. Counseled regarding abstinence from NSAIDs and all polysubstance abuse.  She verbalized understanding.  Normocytic anemia: No evidence of acute blood loss enough to drop her hemoglobin. Current hemoglobin appears to be at her baseline.   Follow CBC daily.  Hypokalemia: Replace and follow.  Polysubstance abuse: UDS again positive for opiates, cocaine and THC as has been in the past several times at least since 2015. Cessation counseled but really not sure if she will heed. Tobacco cessation counseled.  GERD: Remains on  IV PPI.  EGD in AM.  Hypertension: Mildly uncontrolled. As needed IV hydralazine.  There is no height or weight on file to calculate BMI.   ACP Documents: None present. DVT prophylaxis: SCDs Start: 09/06/22 0843     Code Status: Full Code:  Family Communication: None at bedside. Disposition:  Status is: Observation The patient remains OBS appropriate and will d/c before 2 midnights.     Consultants:     Procedures:     Antimicrobials:      Subjective:  No further nausea, vomiting or BM since hospital admission.  Abdominal pain better but not resolved.  Objective:   Vitals:   09/06/22 2310 09/07/22 0435 09/07/22 0521 09/07/22 1331  BP: 123/71  (!) 145/90 (!) 165/88  Pulse: 66   71  Resp: 19   20  Temp: 98.9 F (37.2 C) 97.9 F (36.6 C)  98.6 F (37 C)  TempSrc: Oral Axillary  Oral  SpO2: 99% 100%  100%    General exam: Young female, moderately built and thinly nourished seen ambulating steadily in the room with nursing tech supervision.  Subsequently lying in bed without distress. Respiratory system: Clear to auscultation. Respiratory effort normal. Cardiovascular system: S1 & S2 heard, RRR. No JVD, murmurs, rubs, gallops or clicks. No pedal edema. Gastrointestinal system: Abdomen is nondistended, soft and nontender. No organomegaly or masses felt. Normal bowel sounds heard. Central nervous system: Alert and oriented. No focal neurological deficits. Extremities: Symmetric 5 x 5 power. Skin: No rashes, lesions or ulcers Psychiatry: Judgement and insight appear normal. Mood & affect appropriate.     Data Reviewed:   I  have personally reviewed following labs and imaging studies   CBC: Recent Labs  Lab 09/06/22 0246 09/06/22 1249 09/07/22 0111 09/07/22 0759 09/07/22 1500  WBC 11.9*  --  8.7  --   --   NEUTROABS 9.5*  --  5.7  --   --   HGB 11.2*   < > 10.8*  10.8* 11.5* 11.5*  HCT 34.5*   < > 34.3*  34.6* 36.6 36.5  MCV 89.1  --  91.8  --   --    PLT 398  --  394  --   --    < > = values in this interval not displayed.    Basic Metabolic Panel: Recent Labs  Lab 09/06/22 0246 09/06/22 0433 09/06/22 1249 09/07/22 0759  NA 136  --  135 136  K <2.0* 2.7* 3.2* 3.2*  CL 108  --  106 109  CO2 22  --  23 23  GLUCOSE 94  --  90 81  BUN 13  --  8 <5*  CREATININE 0.63  --  0.57 0.43*  CALCIUM 6.8*  --  7.8* 8.1*  MG  --  1.9  --   --   PHOS  --  4.5  --   --     Liver Function Tests: Recent Labs  Lab 09/06/22 0246  AST 13*  ALT 10  ALKPHOS 49  BILITOT 0.6  PROT 6.1*  ALBUMIN 2.9*    CBG: No results for input(s): "GLUCAP" in the last 168 hours.  Microbiology Studies:  No results found for this or any previous visit (from the past 240 hour(s)).  Radiology Studies:  CT Angio Chest/Abd/Pel for Dissection W and/or Wo Contrast  Result Date: 09/06/2022 CLINICAL DATA:  48 year old female with history of hypertension presenting with signs and symptoms of possible acute aortic syndrome. EXAM: CT ANGIOGRAPHY CHEST, ABDOMEN AND PELVIS TECHNIQUE: Non-contrast CT of the chest was initially obtained. Multidetector CT imaging through the chest, abdomen and pelvis was performed using the standard protocol during bolus administration of intravenous contrast. Multiplanar reconstructed images and MIPs were obtained and reviewed to evaluate the vascular anatomy. RADIATION DOSE REDUCTION: This exam was performed according to the departmental dose-optimization program which includes automated exposure control, adjustment of the mA and/or kV according to patient size and/or use of iterative reconstruction technique. CONTRAST:  OMNIPAQUE IOHEXOL 350 MG/ML SOLN COMPARISON:  Chest CTA 08/09/2014. CT of the abdomen and pelvis 06/02/2015. FINDINGS: CTA CHEST FINDINGS Cardiovascular: Precontrast images demonstrate no crescentic high attenuation associated with the wall of the thoracic aorta to suggest the presence of acute intramural hemorrhage.  Postcontrast images demonstrate no evidence of thoracic aortic aneurysm or dissection. Thoracic aorta is normal in caliber measuring 3.5 cm, 2.6 cm and 2.4 cm in diameter in the ascending thoracic aorta, mid arch and descending thoracic aorta respectively. No atherosclerotic calcifications are noted in the thoracic aorta or the coronary arteries. Heart size is normal. There is no significant pericardial fluid, thickening or pericardial calcification. Mediastinum/Nodes: No pathologically enlarged mediastinal or hilar lymph nodes. Esophagus is unremarkable in appearance. No axillary lymphadenopathy. Lungs/Pleura: Mild centrilobular and paraseptal emphysema. Small calcified granuloma in the right upper lobe. No other suspicious appearing pulmonary nodules or masses are noted. No pneumothorax. No acute consolidative airspace disease. No pleural effusions. Musculoskeletal: There are no aggressive appearing lytic or blastic lesions noted in the visualized portions of the skeleton. Review of the MIP images confirms the above findings. CTA ABDOMEN AND PELVIS FINDINGS VASCULAR Aorta:  Mild aortic atherosclerosis. Normal caliber aorta without aneurysm, dissection, vasculitis or significant stenosis. Celiac: Patent without evidence of aneurysm, dissection, vasculitis or significant stenosis. SMA: Patent without evidence of aneurysm, dissection, vasculitis or significant stenosis. Renals: Both renal arteries are patent without evidence of aneurysm, dissection, vasculitis, fibromuscular dysplasia or significant stenosis. IMA: Patent without evidence of aneurysm, dissection, vasculitis or significant stenosis. Inflow: Patent without evidence of aneurysm, dissection, vasculitis or significant stenosis. Veins: No obvious venous abnormality within the limitations of this arterial phase study. Review of the MIP images confirms the above findings. NON-VASCULAR Hepatobiliary: No definite suspicious cystic or solid hepatic lesions are  noted on today's arterial phase examination. Status post cholecystectomy. No intra or extrahepatic biliary ductal dilatation. Pancreas: No pancreatic mass. No pancreatic ductal dilatation. No pancreatic or peripancreatic fluid collections or inflammatory changes. Spleen: Unremarkable. Adrenals/Urinary Tract: Bilateral kidneys and adrenal glands are normal in appearance. No hydroureteronephrosis. Urinary bladder is normal in appearance. Stomach/Bowel: Stomach is unremarkable in appearance. No pathologic dilatation of small bowel or colon. The appendix is not confidently identified and may be surgically absent. Regardless, there are no inflammatory changes noted adjacent to the cecum to suggest the presence of an acute appendicitis at this time. Lymphatic: No lymphadenopathy noted in the abdomen or pelvis. Reproductive: Uterus and ovaries are unremarkable in appearance. Other: Small umbilical hernia containing omental fat. No significant volume of ascites. No pneumoperitoneum. Musculoskeletal: There are no aggressive appearing lytic or blastic lesions noted in the visualized portions of the skeleton. Review of the MIP images confirms the above findings. IMPRESSION: 1. No findings to suggest acute aortic syndrome. 2. No acute findings are noted in the abdomen or pelvis to account for the patient's symptoms. 3. Mild centrilobular and paraseptal emphysema. 4. Mild atherosclerosis of the abdominal aorta. 5. Small umbilical hernia containing only omental fat. No associated bowel incarceration or obstruction at this time. Electronically Signed   By: Trudie Reed M.D.   On: 09/06/2022 06:36   DG Chest 2 View  Result Date: 09/06/2022 CLINICAL DATA:  Chest pain. EXAM: CHEST - 2 VIEW COMPARISON:  AP chest 07/12/2017 FINDINGS: There is mild cardiomegaly. No vascular congestion is seen. The mediastinum is normally outlined. The lungs are clear. There is age advanced degenerative disc disease at multiple thoracic spine  levels spread no acute osseous findings. Two pericardial and stable chest. IMPRESSION: No active cardiopulmonary disease. Mild cardiomegaly. 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 Electronically Signed   By: Almira Bar M.D.   On: 09/06/2022 04:29    Scheduled Meds:    [START ON 09/09/2022] pantoprazole  40 mg Intravenous Q12H    Continuous Infusions:    pantoprazole 8 mg/hr (09/07/22 1134)     LOS: 0 days     Marcellus Scott, MD,  FACP, [facility], [facility], [facility], [facility]   [facility] [place]     To contact the attending provider between 7A-7P or the covering provider during after hours 7P-7A, please log into the web site [email or web address] and access using universal [place] password for that web site. If you do not have the password, please call the hospital operator.  09/07/2022, 3:38 PM

## 2022-09-07 NOTE — H&P (View-Only) (Signed)
Subjective: Complains of left upper quadrant abdominal pain. Denies further episodes of coffee-ground emesis or black stools.  Objective: Vital signs in last 24 hours: Temp:  [97.9 F (36.6 C)-100.6 F (38.1 C)] 97.9 F (36.6 C) (06/29 0435) Pulse Rate:  [66-103] 66 (06/28 2310) Resp:  [17-19] 19 (06/28 2310) BP: (123-194)/(71-110) 145/90 (06/29 0521) SpO2:  [99 %-100 %] 100 % (06/29 0435) Weight change:  Last BM Date : 09/06/22  PE: Not in distress GENERAL: Mild pallor  ABDOMEN: Mild left upper quadrant tenderness, no rebound tenderness, no rigidity, no guarding, soft abdomen, normoactive bowel sounds EXTREMITIES: No deformity  Lab Results: Results for orders placed or performed during the hospital encounter of 09/06/22 (from the past 48 hour(s))  CBC with Differential     Status: Abnormal   Collection Time: 09/06/22  2:46 AM  Result Value Ref Range   WBC 11.9 (H) 4.0 - 10.5 K/uL   RBC 3.87 3.87 - 5.11 MIL/uL   Hemoglobin 11.2 (L) 12.0 - 15.0 g/dL   HCT 34.5 (L) 36.0 - 46.0 %   MCV 89.1 80.0 - 100.0 fL   MCH 28.9 26.0 - 34.0 pg   MCHC 32.5 30.0 - 36.0 g/dL   RDW 15.8 (H) 11.5 - 15.5 %   Platelets 398 150 - 400 K/uL   nRBC 0.0 0.0 - 0.2 %   Neutrophils Relative % 80 %   Neutro Abs 9.5 (H) 1.7 - 7.7 K/uL   Lymphocytes Relative 13 %   Lymphs Abs 1.5 0.7 - 4.0 K/uL   Monocytes Relative 6 %   Monocytes Absolute 0.7 0.1 - 1.0 K/uL   Eosinophils Relative 1 %   Eosinophils Absolute 0.1 0.0 - 0.5 K/uL   Basophils Relative 0 %   Basophils Absolute 0.0 0.0 - 0.1 K/uL   Immature Granulocytes 0 %   Abs Immature Granulocytes 0.04 0.00 - 0.07 K/uL    Comment: Performed at [facility], [address] [place], [place] [address]  Troponin I (High Sensitivity)     Status: None   Collection Time: 09/06/22  2:46 AM  Result Value Ref Range   Troponin I (High Sensitivity) 6 <18 ng/L    Comment: (NOTE) Elevated high sensitivity troponin I (hsTnI) values and  significant  changes across serial measurements may suggest ACS but many other  chronic and acute conditions are known to elevate hsTnI results.  Refer to the "Links" section for chest pain algorithms and additional  guidance. Performed at  [facility], [address] , [place] [address]   Comprehensive metabolic panel     Status: Abnormal   Collection Time: 09/06/22  2:46 AM  Result Value Ref Range   Sodium 136 135 - 145 mmol/L   Potassium <2.0 (LL) 3.5 - 5.1 mmol/L    Comment: REPEATED TO VERIFY CRITICAL RESULT CALLED TO, READ BACK BY AND VERIFIED WITH A. KELSEY, RN 09/06/22 0356 BY K. DAVIS    Chloride 108 98 - 111 mmol/L   CO2 22 22 - 32 mmol/L   Glucose, Bld 94 70 - 99 mg/dL    Comment: Glucose reference range applies only to samples taken after fasting for at least 8 hours.   BUN 13 6 - 20 mg/dL   Creatinine, Ser 0.63 0.44 - 1.00 mg/dL   Calcium 6.8 (L) 8.9 - 10.3 mg/dL   Total Protein 6.1 (L) 6.5 - 8.1 g/dL   Albumin 2.9 (L) 3.5 - 5.0 g/dL   AST 13 (L)   15 - 41 U/L   ALT 10 0 - 44 U/L   Alkaline Phosphatase 49 38 - 126 U/L   Total Bilirubin 0.6 0.3 - 1.2 mg/dL   GFR, Estimated >60 >60 mL/min    Comment: (NOTE) Calculated using the CKD-EPI Creatinine Equation (2021)    Anion gap 6 5 - 15    Comment: Performed at [facility], [address] [place], [place] [address]  Lipase, blood     Status: None   Collection Time: 09/06/22  2:46 AM  Result Value Ref Range   Lipase 25 11 - 51 U/L    Comment: Performed at [facility], [address] [place], [place] [address]  Magnesium     Status: None   Collection Time: 09/06/22  4:33 AM  Result Value Ref Range   Magnesium 1.9 1.7 - 2.4 mg/dL    Comment: Performed at [facility], [address] [place], [place] [address]  Potassium     Status: Abnormal   Collection Time: 09/06/22  4:33 AM  Result Value Ref Range   Potassium 2.7 (LL) 3.5 -  5.1 mmol/L    Comment: CRITICAL RESULT CALLED TO, READ BACK BY AND VERIFIED WITH A. KELSEY, RN 09/06/22 0512 BY K. DAVIS Performed at [facility], [address] [place], [place] [address]   Lactic acid, plasma     Status: None   Collection Time: 09/06/22  4:33 AM  Result Value Ref Range   Lactic Acid, Venous 0.8 0.5 - 1.9 mmol/L    Comment: Performed at [facility], [address] [place], [place] [address]  Troponin I (High Sensitivity)     Status: None   Collection Time: 09/06/22  4:33 AM  Result Value Ref Range   Troponin I (High Sensitivity) 7 <18 ng/L    Comment: (NOTE) Elevated high sensitivity troponin I (hsTnI) values and significant  changes across serial measurements may suggest ACS but many other  chronic and acute conditions are known to elevate hsTnI results.  Refer to the "Links" section for chest pain algorithms and additional  guidance. Performed at [facility], [address] [place], [place] [address]   hCG, quantitative, pregnancy     Status: None   Collection Time: 09/06/22  4:33 AM  Result Value Ref Range   hCG, Beta Chain, Quant, S <1 <5 mIU/mL    Comment:          GEST. AGE      CONC.  (mIU/mL)   <=1 WEEK        5 - 50     2 WEEKS       50 - 500     3 WEEKS       100 - 10,000     4 WEEKS     1,000 - 30,000     5 WEEKS     3,500 - 115,000   6-8 WEEKS     12,000 - 270,000    12 WEEKS     15,000 - 220,000        FEMALE AND NON-PREGNANT FEMALE:     LESS THAN 5 mIU/mL Performed at  [facility], [address] [place], [place] [address]   Phosphorus     Status: None   Collection Time: 09/06/22  4:33 AM  Result Value Ref Range   Phosphorus 4.5 2.5 - 4.6 mg/dL    Comment: Performed at    [facility], [address] [place], [place] [address]  Lactic acid, plasma     Status: None   Collection Time: 09/06/22  6:35 AM  Result Value Ref Range   Lactic Acid,  Venous 0.8 0.5 - 1.9 mmol/L    Comment: Performed at [facility], [address] [place], [place] [address]  Urine rapid drug screen (hosp performed)     Status: Abnormal   Collection Time: 09/06/22  6:50 AM  Result Value Ref Range   Opiates POSITIVE (A) NONE DETECTED   Cocaine POSITIVE (A) NONE DETECTED   Benzodiazepines NONE DETECTED NONE DETECTED   Amphetamines NONE DETECTED NONE DETECTED   Tetrahydrocannabinol POSITIVE (A) NONE DETECTED   Barbiturates NONE DETECTED NONE DETECTED    Comment: (NOTE) DRUG SCREEN FOR MEDICAL PURPOSES ONLY.  IF CONFIRMATION IS NEEDED FOR ANY PURPOSE, NOTIFY LAB WITHIN 5 DAYS.  LOWEST DETECTABLE LIMITS FOR URINE DRUG SCREEN Drug Class                     Cutoff (ng/mL) Amphetamine and metabolites    1000 Barbiturate and metabolites    200 Benzodiazepine                 200 Opiates and metabolites        300 Cocaine and metabolites        300 THC                            50 Performed at [facility], [address] [place], [place] [address]   Pregnancy, urine     Status: None   Collection Time: 09/06/22  6:50 AM  Result Value Ref Range   Preg Test, Ur NEGATIVE NEGATIVE    Comment:        THE SENSITIVITY OF THIS METHODOLOGY IS >25 mIU/mL. Performed at [facility], [address] [place], [place] [address]   HIV Antibody (routine testing w rflx)     Status: None   Collection Time: 09/06/22  9:49 AM  Result Value Ref Range   HIV Screen 4th Generation wRfx Non Reactive Non Reactive    Comment: Performed at [facility], [address] [place], [place] [address]  Basic metabolic panel     Status: Abnormal   Collection Time: 09/06/22 12:49 PM  Result Value Ref Range   Sodium 135 135 - 145 mmol/L   Potassium 3.2 (L) 3.5 - 5.1 mmol/L   Chloride 106 98 - 111 mmol/L   CO2 23 22 - 32 mmol/L   Glucose, Bld 90 70 - 99 mg/dL    Comment: Glucose reference range applies only to  samples taken after fasting for at least 8 hours.   BUN 8 6 - 20 mg/dL   Creatinine, Ser 0.57 0.44 - 1.00 mg/dL   Calcium 7.8 (L) 8.9 - 10.3 mg/dL   GFR, Estimated >60 >60 mL/min    Comment: (NOTE) Calculated using the CKD-EPI Creatinine Equation (2021)    Anion gap 6 5 - 15    Comment: Performed at [facility], [address] , [place] [address]  Hemoglobin and hematocrit, blood     Status: Abnormal   Collection Time: 09/06/22 12:49 PM  Result Value Ref Range   Hemoglobin 10.1 (L) 12.0 - 15.0 g/dL   HCT 31.7 (L) 36.0 - 46.0 %    Comment: Performed at   [facility], [address] [place], [place] [address]  Type and screen [facility]     Status: None   Collection Time: 09/06/22 12:51 PM  Result Value Ref Range   ABO/RH(D) O NEG    Antibody Screen NEG    Sample Expiration      09/09/2022,2359 Performed at [facility], [address] [place], [place] [address]   Hemoglobin and hematocrit, blood     Status: Abnormal   Collection Time: 09/06/22  6:21 PM  Result Value Ref Range   Hemoglobin 10.3 (L) 12.0 - 15.0 g/dL   HCT 32.0 (L) 36.0 - 46.0 %    Comment: Performed at [facility], [address] [place], [place] [address]  Hemoglobin and hematocrit, blood     Status: Abnormal   Collection Time: 09/07/22  1:11 AM  Result Value Ref Range   Hemoglobin 10.8 (L) 12.0 - 15.0 g/dL   HCT 34.3 (L) 36.0 - 46.0 %    Comment: Performed at [facility], [address] [place], [place] [address]  CBC with Differential/Platelet     Status: Abnormal   Collection Time: 09/07/22  1:11 AM  Result Value Ref Range   WBC 8.7 4.0 - 10.5 K/uL   RBC 3.77 (L) 3.87 - 5.11 MIL/uL   Hemoglobin 10.8 (L) 12.0 - 15.0 g/dL   HCT 34.6 (L) 36.0 - 46.0 %   MCV 91.8 80.0 - 100.0 fL   MCH 28.6 26.0 - 34.0 pg   MCHC 31.2 30.0 - 36.0 g/dL   RDW 15.9 (H) 11.5 - 15.5 %   Platelets 394  150 - 400 K/uL   nRBC 0.0 0.0 - 0.2 %   Neutrophils Relative % 64 %   Neutro Abs 5.7 1.7 - 7.7 K/uL   Lymphocytes Relative 25 %   Lymphs Abs 2.1 0.7 - 4.0 K/uL   Monocytes Relative 8 %   Monocytes Absolute 0.7 0.1 - 1.0 K/uL   Eosinophils Relative 2 %   Eosinophils Absolute 0.1 0.0 - 0.5 K/uL   Basophils Relative 1 %   Basophils Absolute 0.0 0.0 - 0.1 K/uL   Immature Granulocytes 0 %   Abs Immature Granulocytes 0.03 0.00 - 0.07 K/uL    Comment: Performed at [facility], [address] [place], [place] [address]  Hemoglobin and hematocrit, blood     Status: Abnormal   Collection Time: 09/07/22  7:59 AM  Result Value Ref Range   Hemoglobin 11.5 (L) 12.0 - 15.0 g/dL   HCT 36.6 36.0 - 46.0 %    Comment: Performed at [facility], [address] [place], [place] [address]  Basic metabolic panel     Status: Abnormal   Collection Time: 09/07/22  7:59 AM  Result Value Ref Range   Sodium 136 135 - 145 mmol/L   Potassium 3.2 (L) 3.5 - 5.1 mmol/L   Chloride 109 98 - 111 mmol/L   CO2 23 22 - 32 mmol/L   Glucose, Bld 81 70 - 99 mg/dL    Comment: Glucose reference range applies only to samples taken after fasting for at least 8 hours.   BUN <5 (L) 6 - 20 mg/dL   Creatinine, Ser 0.43 (L) 0.44 - 1.00 mg/dL   Calcium 8.1 (L) 8.9 - 10.3 mg/dL   GFR, Estimated >60 >60 mL/min    Comment: (NOTE) Calculated using the CKD-EPI Creatinine Equation (2021)      Anion gap 4 (L) 5 - 15    Comment: Performed at [facility], [address] [place], [place] [address]    Studies/Results: CT Angio Chest/Abd/Pel for Dissection W and/or Wo Contrast  Result Date: 09/06/2022 CLINICAL DATA:  46-year-old female with history of hypertension presenting with signs and symptoms of possible acute aortic syndrome. EXAM: CT ANGIOGRAPHY CHEST, ABDOMEN AND PELVIS TECHNIQUE: Non-contrast CT of the chest was initially obtained. Multidetector CT imaging through the  chest, abdomen and pelvis was performed using the standard protocol during bolus administration of intravenous contrast. Multiplanar reconstructed images and MIPs were obtained and reviewed to evaluate the vascular anatomy. RADIATION DOSE REDUCTION: This exam was performed according to the departmental dose-optimization program which includes automated exposure control, adjustment of the mA and/or kV according to patient size and/or use of iterative reconstruction technique. CONTRAST:  100mL OMNIPAQUE IOHEXOL 350 MG/ML SOLN COMPARISON:  Chest CTA 08/09/2014. CT of the abdomen and pelvis 06/02/2015. FINDINGS: CTA CHEST FINDINGS Cardiovascular: Precontrast images demonstrate no crescentic high attenuation associated with the wall of the thoracic aorta to suggest the presence of acute intramural hemorrhage. Postcontrast images demonstrate no evidence of thoracic aortic aneurysm or dissection. Thoracic aorta is normal in caliber measuring 3.5 cm, 2.6 cm and 2.4 cm in diameter in the ascending thoracic aorta, mid arch and descending thoracic aorta respectively. No atherosclerotic calcifications are noted in the thoracic aorta or the coronary arteries. Heart size is normal. There is no significant pericardial fluid, thickening or pericardial calcification. Mediastinum/Nodes: No pathologically enlarged mediastinal or hilar lymph nodes. Esophagus is unremarkable in appearance. No axillary lymphadenopathy. Lungs/Pleura: Mild centrilobular and paraseptal emphysema. Small calcified granuloma in the right upper lobe. No other suspicious appearing pulmonary nodules or masses are noted. No pneumothorax. No acute consolidative airspace disease. No pleural effusions. Musculoskeletal: There are no aggressive appearing lytic or blastic lesions noted in the visualized portions of the skeleton. Review of the MIP images confirms the above findings. CTA ABDOMEN AND PELVIS FINDINGS VASCULAR Aorta: Mild aortic atherosclerosis. Normal  caliber aorta without aneurysm, dissection, vasculitis or significant stenosis. Celiac: Patent without evidence of aneurysm, dissection, vasculitis or significant stenosis. SMA: Patent without evidence of aneurysm, dissection, vasculitis or significant stenosis. Renals: Both renal arteries are patent without evidence of aneurysm, dissection, vasculitis, fibromuscular dysplasia or significant stenosis. IMA: Patent without evidence of aneurysm, dissection, vasculitis or significant stenosis. Inflow: Patent without evidence of aneurysm, dissection, vasculitis or significant stenosis. Veins: No obvious venous abnormality within the limitations of this arterial phase study. Review of the MIP images confirms the above findings. NON-VASCULAR Hepatobiliary: No definite suspicious cystic or solid hepatic lesions are noted on today's arterial phase examination. Status post cholecystectomy. No intra or extrahepatic biliary ductal dilatation. Pancreas: No pancreatic mass. No pancreatic ductal dilatation. No pancreatic or peripancreatic fluid collections or inflammatory changes. Spleen: Unremarkable. Adrenals/Urinary Tract: Bilateral kidneys and adrenal glands are normal in appearance. No hydroureteronephrosis. Urinary bladder is normal in appearance. Stomach/Bowel: Stomach is unremarkable in appearance. No pathologic dilatation of small bowel or colon. The appendix is not confidently identified and may be surgically absent. Regardless, there are no inflammatory changes noted adjacent to the cecum to suggest the presence of an acute appendicitis at this time. Lymphatic: No lymphadenopathy noted in the abdomen or pelvis. Reproductive: Uterus and ovaries are unremarkable in appearance. Other: Small umbilical hernia containing omental fat. No significant volume of ascites. No pneumoperitoneum. Musculoskeletal: There are no aggressive appearing lytic or blastic lesions noted in the visualized portions   of the skeleton. Review of the  MIP images confirms the above findings. IMPRESSION: 1. No findings to suggest acute aortic syndrome. 2. No acute findings are noted in the abdomen or pelvis to account for the patient's symptoms. 3. Mild centrilobular and paraseptal emphysema. 4. Mild atherosclerosis of the abdominal aorta. 5. Small umbilical hernia containing only omental fat. No associated bowel incarceration or obstruction at this time. Electronically Signed   By: Daniel  Entrikin M.D.   On: 09/06/2022 06:36   DG Chest 2 View  Result Date: 09/06/2022 CLINICAL DATA:  Chest pain. EXAM: CHEST - 2 VIEW COMPARISON:  AP chest 07/12/2017 FINDINGS: There is mild cardiomegaly. No vascular congestion is seen. The mediastinum is normally outlined. The lungs are clear. There is age advanced degenerative disc disease at multiple thoracic spine levels spread no acute osseous findings. Two pericardial and stable chest. IMPRESSION: No active cardiopulmonary disease. Mild cardiomegaly. 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 Electronically Signed   By: Keith  Chesser M.D.   On: 09/06/2022 04:29    Medications: I have reviewed the patient's current medications.  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   Has been started on clear liquid diet, will keep n.p.o. postmidnight, Continue pantoprazole 8 mg/h IV for now until EGD. The risks and the benefits of the procedure were discussed with the patient in details.  She understands this concept..  Renardo Cheatum, MD 09/07/2022, 10:46 AM    

## 2022-09-07 NOTE — Progress Notes (Signed)
Subjective: Complains of left upper quadrant abdominal pain. Denies further episodes of coffee-ground emesis or black stools.  Objective: Vital signs in last 24 hours: Temp:  [97.9 F (36.6 C)-100.6 F (38.1 C)] 97.9 F (36.6 C) (06/29 0435) Pulse Rate:  [66-103] 66 (06/28 2310) Resp:  [17-19] 19 (06/28 2310) BP: (123-194)/(71-110) 145/90 (06/29 0521) SpO2:  [99 %-100 %] 100 % (06/29 0435) Weight change:  Last BM Date : 09/06/22  PE: Not in distress GENERAL: Mild pallor  ABDOMEN: Mild left upper quadrant tenderness, no rebound tenderness, no rigidity, no guarding, soft abdomen, normoactive bowel sounds EXTREMITIES: No deformity  Lab Results: Results for orders placed or performed during the hospital encounter of 09/06/22 (from the past 48 hour(s))  CBC with Differential     Status: Abnormal   Collection Time: 09/06/22  2:46 AM  Result Value Ref Range   WBC 11.9 (H) 4.0 - 10.5 K/uL   RBC 3.87 3.87 - 5.11 MIL/uL   Hemoglobin 11.2 (L) 12.0 - 15.0 g/dL   HCT 16.1 (L) 09.6 - 04.5 %   MCV 89.1 80.0 - 100.0 fL   MCH 28.9 26.0 - 34.0 pg   MCHC 32.5 30.0 - 36.0 g/dL   RDW 40.9 (H) 81.1 - 91.4 %   Platelets 398 150 - 400 K/uL   nRBC 0.0 0.0 - 0.2 %   Neutrophils Relative % 80 %   Neutro Abs 9.5 (H) 1.7 - 7.7 K/uL   Lymphocytes Relative 13 %   Lymphs Abs 1.5 0.7 - 4.0 K/uL   Monocytes Relative 6 %   Monocytes Absolute 0.7 0.1 - 1.0 K/uL   Eosinophils Relative 1 %   Eosinophils Absolute 0.1 0.0 - 0.5 K/uL   Basophils Relative 0 %   Basophils Absolute 0.0 0.0 - 0.1 K/uL   Immature Granulocytes 0 %   Abs Immature Granulocytes 0.04 0.00 - 0.07 K/uL    Comment: Performed at [facility], [address], [place], [place] [address]  Troponin I (High Sensitivity)     Status: None   Collection Time: 09/06/22  2:46 AM  Result Value Ref Range   Troponin I (High Sensitivity) 6 <18 ng/L    Comment: (NOTE) Elevated high sensitivity troponin I (hsTnI) values and  significant  changes across serial measurements may suggest ACS but many other  chronic and acute conditions are known to elevate hsTnI results.  Refer to the "Links" section for chest pain algorithms and additional  guidance. Performed at [facility], [address] [place], [place] [address]   Comprehensive metabolic panel     Status: Abnormal   Collection Time: 09/06/22  2:46 AM  Result Value Ref Range   Sodium 136 135 - 145 mmol/L   Potassium <2.0 (LL) 3.5 - 5.1 mmol/L    Comment: REPEATED TO VERIFY CRITICAL RESULT CALLED TO, READ BACK BY AND VERIFIED WITH A. KELSEY, RN 09/06/22 0356 BY K. DAVIS    Chloride 108 98 - 111 mmol/L   CO2 22 22 - 32 mmol/L   Glucose, Bld 94 70 - 99 mg/dL    Comment: Glucose reference range applies only to samples taken after fasting for at least 8 hours.   BUN 13 6 - 20 mg/dL   Creatinine, Ser 8.65 0.44 - 1.00 mg/dL   Calcium 6.8 (L) 8.9 - 10.3 mg/dL   Total Protein 6.1 (L) 6.5 - 8.1 g/dL   Albumin 2.9 (L) 3.5 - 5.0 g/dL   AST 13 (L)  15 - 41 U/L   ALT 10 0 - 44 U/L   Alkaline Phosphatase 49 38 - 126 U/L   Total Bilirubin 0.6 0.3 - 1.2 mg/dL   GFR, Estimated >78 >29 mL/min    Comment: (NOTE) Calculated using the CKD-EPI Creatinine Equation (2021)    Anion gap 6 5 - 15    Comment: Performed at [facility], [address], [place], [place] [address]  Lipase, blood     Status: None   Collection Time: 09/06/22  2:46 AM  Result Value Ref Range   Lipase 25 11 - 51 U/L    Comment: Performed at [facility], [address], [place], [place] [address]  Magnesium     Status: None   Collection Time: 09/06/22  4:33 AM  Result Value Ref Range   Magnesium 1.9 1.7 - 2.4 mg/dL    Comment: Performed at [facility], [address], [place], [place] [address]  Potassium     Status: Abnormal   Collection Time: 09/06/22  4:33 AM  Result Value Ref Range   Potassium 2.7 (LL) 3.5 -  5.1 mmol/L    Comment: CRITICAL RESULT CALLED TO, READ BACK BY AND VERIFIED WITH Lauro Regulus, RN 09/06/22 0512 BY Kirtland Bouchard. DAVIS Performed at [facility], [address], [place], [place] [address]   Lactic acid, plasma     Status: None   Collection Time: 09/06/22  4:33 AM  Result Value Ref Range   Lactic Acid, Venous 0.8 0.5 - 1.9 mmol/L    Comment: Performed at [facility], [address] [place], [place] [address]  Troponin I (High Sensitivity)     Status: None   Collection Time: 09/06/22  4:33 AM  Result Value Ref Range   Troponin I (High Sensitivity) 7 <18 ng/L    Comment: (NOTE) Elevated high sensitivity troponin I (hsTnI) values and significant  changes across serial measurements may suggest ACS but many other  chronic and acute conditions are known to elevate hsTnI results.  Refer to the "Links" section for chest pain algorithms and additional  guidance. Performed at [facility], [address], [place], [place] [address]   hCG, quantitative, pregnancy     Status: None   Collection Time: 09/06/22  4:33 AM  Result Value Ref Range   hCG, Beta Chain, Quant, S <1 <5 mIU/mL    Comment:          GEST. AGE      CONC.  (mIU/mL)   <=1 WEEK        5 - 50     2 WEEKS       50 - 500     3 WEEKS       100 - 10,000     4 WEEKS     1,000 - 30,000     5 WEEKS     3,500 - 115,000   6-8 WEEKS     12,000 - 270,000    12 WEEKS     15,000 - 220,000        FEMALE AND NON-PREGNANT FEMALE:     LESS THAN 5 mIU/mL Performed at [facility], [address] [place], [place] [address]   Phosphorus     Status: None   Collection Time: 09/06/22  4:33 AM  Result Value Ref Range   Phosphorus 4.5 2.5 - 4.6 mg/dL    Comment: Performed at [facility], [address], [place], [place] [address]  Lactic acid, plasma     Status: None   Collection Time: 09/06/22  6:35 AM  Result Value Ref Range   Lactic Acid,  Venous 0.8 0.5 - 1.9 mmol/L    Comment: Performed at [facility], [address], [place], [place] [address]  Urine rapid drug screen (hosp performed)     Status: Abnormal   Collection Time: 09/06/22  6:50 AM  Result Value Ref Range   Opiates POSITIVE (A) NONE DETECTED   Cocaine POSITIVE (A) NONE DETECTED   Benzodiazepines NONE DETECTED NONE DETECTED   Amphetamines NONE DETECTED NONE DETECTED   Tetrahydrocannabinol POSITIVE (A) NONE DETECTED   Barbiturates NONE DETECTED NONE DETECTED    Comment: (NOTE) DRUG SCREEN FOR MEDICAL PURPOSES ONLY.  IF CONFIRMATION IS NEEDED FOR ANY PURPOSE, NOTIFY LAB WITHIN 5 DAYS.  LOWEST DETECTABLE LIMITS FOR URINE DRUG SCREEN Drug Class                     Cutoff (ng/mL) Amphetamine and metabolites    1000 Barbiturate and metabolites    200 Benzodiazepine                 200 Opiates and metabolites        300 Cocaine and metabolites        300 THC                            50 Performed at [facility], [address], [place], [place] [address]   Pregnancy, urine     Status: None   Collection Time: 09/06/22  6:50 AM  Result Value Ref Range   Preg Test, Ur NEGATIVE NEGATIVE    Comment:        THE SENSITIVITY OF THIS METHODOLOGY IS >25 mIU/mL. Performed at [facility], [address], [place], [place] [address]   HIV Antibody (routine testing w rflx)     Status: None   Collection Time: 09/06/22  9:49 AM  Result Value Ref Range   HIV Screen 4th Generation wRfx Non Reactive Non Reactive    Comment: Performed at [facility], [address], [place], [place] [address]  Basic metabolic panel     Status: Abnormal   Collection Time: 09/06/22 12:49 PM  Result Value Ref Range   Sodium 135 135 - 145 mmol/L   Potassium 3.2 (L) 3.5 - 5.1 mmol/L   Chloride 106 98 - 111 mmol/L   CO2 23 22 - 32 mmol/L   Glucose, Bld 90 70 - 99 mg/dL    Comment: Glucose reference range applies only to  samples taken after fasting for at least 8 hours.   BUN 8 6 - 20 mg/dL   Creatinine, Ser 5.28 0.44 - 1.00 mg/dL   Calcium 7.8 (L) 8.9 - 10.3 mg/dL   GFR, Estimated >41 >32 mL/min    Comment: (NOTE) Calculated using the CKD-EPI Creatinine Equation (2021)    Anion gap 6 5 - 15    Comment: Performed at [facility], [address] [place], [place] [address]  Hemoglobin and hematocrit, blood     Status: Abnormal   Collection Time: 09/06/22 12:49 PM  Result Value Ref Range   Hemoglobin 10.1 (L) 12.0 - 15.0 g/dL   HCT 27.2 (L) 53.6 - 64.4 %    Comment: Performed at  [facility], [address], [place], [place] [address]  Type and screen [facility]     Status: None   Collection Time: 09/06/22 12:51 PM  Result Value Ref Range   ABO/RH(D) O NEG    Antibody Screen NEG    Sample Expiration      09/09/2022,2359 Performed at [facility], [address] [place], [place] [address]   Hemoglobin and hematocrit, blood     Status: Abnormal   Collection Time: 09/06/22  6:21 PM  Result Value Ref Range   Hemoglobin 10.3 (L) 12.0 - 15.0 g/dL   HCT 29.5 (L) 62.1 - 30.8 %    Comment: Performed at [facility], [address] [place], [place] [address]  Hemoglobin and hematocrit, blood     Status: Abnormal   Collection Time: 09/07/22  1:11 AM  Result Value Ref Range   Hemoglobin 10.8 (L) 12.0 - 15.0 g/dL   HCT 69.6 (L) 29.5 - 28.4 %    Comment: Performed at [facility], [address] [place], [place] [address]  CBC with Differential/Platelet     Status: Abnormal   Collection Time: 09/07/22  1:11 AM  Result Value Ref Range   WBC 8.7 4.0 - 10.5 K/uL   RBC 3.77 (L) 3.87 - 5.11 MIL/uL   Hemoglobin 10.8 (L) 12.0 - 15.0 g/dL   HCT 01.0 (L) 27.2 - 53.6 %   MCV 91.8 80.0 - 100.0 fL   MCH 28.6 26.0 - 34.0 pg   MCHC 31.2 30.0 - 36.0 g/dL   RDW 64.4 (H) 03.4 - 74.2 %   Platelets 394  150 - 400 K/uL   nRBC 0.0 0.0 - 0.2 %   Neutrophils Relative % 64 %   Neutro Abs 5.7 1.7 - 7.7 K/uL   Lymphocytes Relative 25 %   Lymphs Abs 2.1 0.7 - 4.0 K/uL   Monocytes Relative 8 %   Monocytes Absolute 0.7 0.1 - 1.0 K/uL   Eosinophils Relative 2 %   Eosinophils Absolute 0.1 0.0 - 0.5 K/uL   Basophils Relative 1 %   Basophils Absolute 0.0 0.0 - 0.1 K/uL   Immature Granulocytes 0 %   Abs Immature Granulocytes 0.03 0.00 - 0.07 K/uL    Comment: Performed at [facility], [address] [place], [place] [address]  Hemoglobin and hematocrit, blood     Status: Abnormal   Collection Time: 09/07/22  7:59 AM  Result Value Ref Range   Hemoglobin 11.5 (L) 12.0 - 15.0 g/dL   HCT 87.5 64.3 - 32.9 %    Comment: Performed at [facility], [address] [place], [place] [address]  Basic metabolic panel     Status: Abnormal   Collection Time: 09/07/22  7:59 AM  Result Value Ref Range   Sodium 136 135 - 145 mmol/L   Potassium 3.2 (L) 3.5 - 5.1 mmol/L   Chloride 109 98 - 111 mmol/L   CO2 23 22 - 32 mmol/L   Glucose, Bld 81 70 - 99 mg/dL    Comment: Glucose reference range applies only to samples taken after fasting for at least 8 hours.   BUN <5 (L) 6 - 20 mg/dL   Creatinine, Ser 1.66 (L) 0.44 - 1.00 mg/dL   Calcium 8.1 (L) 8.9 - 10.3 mg/dL   GFR, Estimated >06 >30 mL/min    Comment: (NOTE) Calculated using the CKD-EPI Creatinine Equation (2021)  Anion gap 4 (L) 5 - 15    Comment: Performed at [facility], [address], [place], [place] [address]    Studies/Results: CT Angio Chest/Abd/Pel for Dissection W and/or Wo Contrast  Result Date: 09/06/2022 CLINICAL DATA:  47 year old female with history of hypertension presenting with signs and symptoms of possible acute aortic syndrome. EXAM: CT ANGIOGRAPHY CHEST, ABDOMEN AND PELVIS TECHNIQUE: Non-contrast CT of the chest was initially obtained. Multidetector CT imaging through the  chest, abdomen and pelvis was performed using the standard protocol during bolus administration of intravenous contrast. Multiplanar reconstructed images and MIPs were obtained and reviewed to evaluate the vascular anatomy. RADIATION DOSE REDUCTION: This exam was performed according to the departmental dose-optimization program which includes automated exposure control, adjustment of the mA and/or kV according to patient size and/or use of iterative reconstruction technique. CONTRAST:  OMNIPAQUE IOHEXOL 350 MG/ML SOLN COMPARISON:  Chest CTA 08/09/2014. CT of the abdomen and pelvis 06/02/2015. FINDINGS: CTA CHEST FINDINGS Cardiovascular: Precontrast images demonstrate no crescentic high attenuation associated with the wall of the thoracic aorta to suggest the presence of acute intramural hemorrhage. Postcontrast images demonstrate no evidence of thoracic aortic aneurysm or dissection. Thoracic aorta is normal in caliber measuring 3.5 cm, 2.6 cm and 2.4 cm in diameter in the ascending thoracic aorta, mid arch and descending thoracic aorta respectively. No atherosclerotic calcifications are noted in the thoracic aorta or the coronary arteries. Heart size is normal. There is no significant pericardial fluid, thickening or pericardial calcification. Mediastinum/Nodes: No pathologically enlarged mediastinal or hilar lymph nodes. Esophagus is unremarkable in appearance. No axillary lymphadenopathy. Lungs/Pleura: Mild centrilobular and paraseptal emphysema. Small calcified granuloma in the right upper lobe. No other suspicious appearing pulmonary nodules or masses are noted. No pneumothorax. No acute consolidative airspace disease. No pleural effusions. Musculoskeletal: There are no aggressive appearing lytic or blastic lesions noted in the visualized portions of the skeleton. Review of the MIP images confirms the above findings. CTA ABDOMEN AND PELVIS FINDINGS VASCULAR Aorta: Mild aortic atherosclerosis. Normal  caliber aorta without aneurysm, dissection, vasculitis or significant stenosis. Celiac: Patent without evidence of aneurysm, dissection, vasculitis or significant stenosis. SMA: Patent without evidence of aneurysm, dissection, vasculitis or significant stenosis. Renals: Both renal arteries are patent without evidence of aneurysm, dissection, vasculitis, fibromuscular dysplasia or significant stenosis. IMA: Patent without evidence of aneurysm, dissection, vasculitis or significant stenosis. Inflow: Patent without evidence of aneurysm, dissection, vasculitis or significant stenosis. Veins: No obvious venous abnormality within the limitations of this arterial phase study. Review of the MIP images confirms the above findings. NON-VASCULAR Hepatobiliary: No definite suspicious cystic or solid hepatic lesions are noted on today's arterial phase examination. Status post cholecystectomy. No intra or extrahepatic biliary ductal dilatation. Pancreas: No pancreatic mass. No pancreatic ductal dilatation. No pancreatic or peripancreatic fluid collections or inflammatory changes. Spleen: Unremarkable. Adrenals/Urinary Tract: Bilateral kidneys and adrenal glands are normal in appearance. No hydroureteronephrosis. Urinary bladder is normal in appearance. Stomach/Bowel: Stomach is unremarkable in appearance. No pathologic dilatation of small bowel or colon. The appendix is not confidently identified and may be surgically absent. Regardless, there are no inflammatory changes noted adjacent to the cecum to suggest the presence of an acute appendicitis at this time. Lymphatic: No lymphadenopathy noted in the abdomen or pelvis. Reproductive: Uterus and ovaries are unremarkable in appearance. Other: Small umbilical hernia containing omental fat. No significant volume of ascites. No pneumoperitoneum. Musculoskeletal: There are no aggressive appearing lytic or blastic lesions noted in the visualized portions  of the skeleton. Review of the  MIP images confirms the above findings. IMPRESSION: 1. No findings to suggest acute aortic syndrome. 2. No acute findings are noted in the abdomen or pelvis to account for the patient's symptoms. 3. Mild centrilobular and paraseptal emphysema. 4. Mild atherosclerosis of the abdominal aorta. 5. Small umbilical hernia containing only omental fat. No associated bowel incarceration or obstruction at this time. Electronically Signed   By: Trudie Reed M.D.   On: 09/06/2022 06:36   DG Chest 2 View  Result Date: 09/06/2022 CLINICAL DATA:  Chest pain. EXAM: CHEST - 2 VIEW COMPARISON:  AP chest 07/12/2017 FINDINGS: There is mild cardiomegaly. No vascular congestion is seen. The mediastinum is normally outlined. The lungs are clear. There is age advanced degenerative disc disease at multiple thoracic spine levels spread no acute osseous findings. Two pericardial and stable chest. IMPRESSION: No active cardiopulmonary disease. Mild cardiomegaly. 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 Electronically Signed   By: Almira Bar M.D.   On: 09/06/2022 04:29    Medications: I have reviewed the patient's current medications.  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   Has been started on clear liquid diet, will keep n.p.o. postmidnight, Continue pantoprazole 8 mg/h IV for now until EGD. The risks and the benefits of the procedure were discussed with the patient in details.  She understands this concept.Kerin Salen, MD 09/07/2022, 10:46 AM

## 2022-09-08 ENCOUNTER — Encounter (HOSPITAL_COMMUNITY): Payer: Self-pay | Admitting: Gastroenterology

## 2022-09-08 ENCOUNTER — Inpatient Hospital Stay (HOSPITAL_COMMUNITY): Payer: Medicaid Other | Admitting: Anesthesiology

## 2022-09-08 ENCOUNTER — Encounter (HOSPITAL_COMMUNITY)
Admission: EM | Disposition: A | Discharge status: Home / Self Care | Payer: Self-pay | Source: Home / Self Care | Attending: Internal Medicine

## 2022-09-08 DIAGNOSIS — K209 Esophagitis, unspecified without bleeding: Secondary | ICD-10-CM

## 2022-09-08 DIAGNOSIS — K3189 Other diseases of stomach and duodenum: Secondary | ICD-10-CM

## 2022-09-08 DIAGNOSIS — K259 Gastric ulcer, unspecified as acute or chronic, without hemorrhage or perforation: Secondary | ICD-10-CM

## 2022-09-08 DIAGNOSIS — K449 Diaphragmatic hernia without obstruction or gangrene: Secondary | ICD-10-CM

## 2022-09-08 HISTORY — PX: BIOPSY: SHX5522

## 2022-09-08 HISTORY — PX: ESOPHAGOGASTRODUODENOSCOPY (EGD) WITH PROPOFOL: SHX5813

## 2022-09-08 LAB — CBC
HCT: 35.4 % — ABNORMAL LOW (ref 36.0–46.0)
Hemoglobin: 11.3 g/dL — ABNORMAL LOW (ref 12.0–15.0)
MCH: 29.2 pg (ref 26.0–34.0)
MCHC: 31.9 g/dL (ref 30.0–36.0)
MCV: 91.5 fL (ref 80.0–100.0)
Platelets: 372 10*3/uL (ref 150–400)
RBC: 3.87 MIL/uL (ref 3.87–5.11)
RDW: 16 % — ABNORMAL HIGH (ref 11.5–15.5)
WBC: 8 10*3/uL (ref 4.0–10.5)
nRBC: 0 % (ref 0.0–0.2)

## 2022-09-08 LAB — BASIC METABOLIC PANEL
Anion gap: 7 (ref 5–15)
BUN: <5 mg/dL — ABNORMAL LOW (ref 6–20)
CO2: 24 mmol/L (ref 22–32)
Calcium: 8.5 mg/dL — ABNORMAL LOW (ref 8.9–10.3)
Chloride: 105 mmol/L (ref 98–111)
Creatinine, Ser: 0.53 mg/dL (ref 0.44–1.00)
GFR, Estimated: >60 mL/min (ref >60)
Glucose, Bld: 89 mg/dL (ref 70–99)
Potassium: 4.6 mmol/L (ref 3.5–5.1)
Sodium: 136 mmol/L (ref 135–145)

## 2022-09-08 LAB — MAGNESIUM: Magnesium: 1.8 mg/dL (ref 1.7–2.4)

## 2022-09-08 SURGERY — ESOPHAGOGASTRODUODENOSCOPY (EGD) WITH PROPOFOL
Anesthesia: Monitor Anesthesia Care

## 2022-09-08 MED ORDER — PROPOFOL 1000 MG/100ML IV EMUL
INTRAVENOUS | Status: AC
  Filled 2022-09-08: qty 100

## 2022-09-08 MED ORDER — SODIUM CHLORIDE 0.9 % IV SOLN: INTRAVENOUS | Status: DC

## 2022-09-08 MED ORDER — PROPOFOL 500 MG/50ML IV EMUL
INTRAVENOUS | Status: DC | PRN
  Administered 2022-09-08: 125 ug/kg/min via INTRAVENOUS

## 2022-09-08 MED ORDER — PHENYLEPHRINE 80 MCG/ML (10ML) SYRINGE FOR IV PUSH (FOR BLOOD PRESSURE SUPPORT)
PREFILLED_SYRINGE | INTRAVENOUS | Status: DC | PRN
  Administered 2022-09-08: 160 ug via INTRAVENOUS

## 2022-09-08 MED ORDER — LACTATED RINGERS IV SOLN: INTRAVENOUS | Status: DC

## 2022-09-08 MED ORDER — PROPOFOL 10 MG/ML IV BOLUS
INTRAVENOUS | Status: DC | PRN
  Administered 2022-09-08 (×3): 20 mg via INTRAVENOUS
  Administered 2022-09-08: 40 mg via INTRAVENOUS

## 2022-09-08 MED ORDER — PANTOPRAZOLE SODIUM 40 MG PO TBEC: 40.0000 mg | DELAYED_RELEASE_TABLET | Freq: Two times a day (BID) | ORAL | 1 refills | Status: DC

## 2022-09-08 MED ORDER — LIDOCAINE 2% (20 MG/ML) 5 ML SYRINGE
INTRAMUSCULAR | Status: DC | PRN
  Administered 2022-09-08: 80 mL via INTRAVENOUS

## 2022-09-08 SURGICAL SUPPLY — 15 items

## 2022-09-08 NOTE — Discharge Summary (Signed)
Physician Discharge Summary  [name] ZOX:[id number] DOB: [birth year]  PCP: Patient, No Pcp Per.  Requested TOC to assist with new PCP.  Admitted from: Home Discharged to: Home  Admit date: 09/06/2022 Discharge date: 09/08/2022  Recommendations for Outpatient Follow-up:    Follow-up Information     Kerin Salen, MD. Schedule an appointment as soon as possible for a visit in 2 month(s).   Specialty: Gastroenterology Contact information: [address] [place] [address] [phone]         Family Physician. Schedule an appointment as soon as possible for a visit in 1 week(s).   Why: To be seen with repeat labs (CBC & BMP).                 Home Health: None    Equipment/Devices: None    Discharge Condition: Improved and stable.   Code Status: Full Code Diet recommendation:  Discharge Diet Orders (From admission, onward)     Start     Ordered   09/08/22 0000  Diet - low sodium heart healthy        09/08/22 1202             Discharge Diagnoses:  Principal Problem:   Hematemesis Active Problems:   GERD (gastroesophageal reflux disease)   Hypertension   Tobacco abuse   Chronic abdominal pain   Cocaine abuse (HCC)   Acute blood loss anemia   Nausea and vomiting   Brief Summary: 47 year old female with medical history significant for hypertension, GERD, esophagitis, duodenal ulcer, polysubstance abuse (cocaine, opiates, THC, tobacco) acute blood loss anemia from GI bleed, depression, presented to the ED with complaints of progressive abdominal pain x 2 days with associated nausea, vomiting, some episodes of hematemesis and melena.  She has been using Goody's powder lately but also took Pepto-Bismol.  Admitted for suspected acute upper GI bleed due to PUD.  [facility] consulted, underwent EGD 6/30 with findings as below and cleared by GI for DC home.     Assessment & Plan:    Acute upper GI bleed: - Given prior history of duodenal  ulcer that required intervention, ongoing NSAID and polysubstance abuse, there was concern for recurrent ulceration. - CTA C/A/P: No acute findings. - Treated with PPI infusion - [facility] was consulted, treated with clear liquid diet yesterday and underwent EGD on 6/30  - EGD essentially showed LA grade a reflux esophagitis with no bleeding, 2 cm hiatal hernia, nonbleeding gastric ulcer with a clean ulcer base.  Patient has tolerated regular diet postprocedure.  GI has cleared patient for discharge on twice daily PPI, they will follow biopsy results as outpatient and patient will need repeat upper endoscopy in 2 months to check ulcer healing. - Counseled regarding abstinence from NSAIDs and all polysubstance abuse.  She verbalized understanding.   Normocytic anemia: - No evidence of acute blood loss enough to drop her hemoglobin. - Current hemoglobin appears to be at her baseline and stable.     Hypokalemia: - Replaced.  Magnesium 1.8.   Polysubstance abuse: - UDS again positive for opiates, cocaine and THC as has been in the past several times at least since 2015. - Cessation counseled but really not sure if she will heed. - Tobacco cessation counseled.   GERD: - EGD results as above.  PPI twice daily.  Hypertension: - Labile and mildly uncontrolled at times. -Outpatient follow-up with PCP.   There is no height or weight on file to  calculate BMI.    Consultants:   [facility]   Procedures:   EGD 6/30:  Impression:        - Normal upper third of esophagus and middle third                            of esophagus.                           - Widely patent Schatzki ring.                           - LA Grade A reflux esophagitis with no bleeding.                            Biopsied.                           - 2 cm hiatal hernia.                           - Non-bleeding gastric ulcer with a clean ulcer                            base (Forrest Class III). Biopsied.                            - Duodenal deformity.                           - Normal first portion of the duodenum and second                            portion of the duodenum.  Recommendation:                 - Resume regular diet.                           - Continue present medications. Will need PPI BID                            for 2 months.                           - Await pathology results.                           - Repeat upper endoscopy in 2 months to check    Discharge Instructions  Discharge Instructions     Call MD for:   Complete by: As directed    Recurrent vomiting blood or coffee-ground colored material, passing blood in stools or black tarry stools.   Call MD for:  difficulty breathing, headache or visual disturbances   Complete by: As directed    Call MD for:  extreme fatigue   Complete by: As directed    Call MD for:  persistant dizziness or light-headedness   Complete by: As directed    Call MD for:  persistant nausea  and vomiting   Complete by: As directed    Call MD for:  severe uncontrolled pain   Complete by: As directed    Diet - low sodium heart healthy   Complete by: As directed    Increase activity slowly   Complete by: As directed         Medication List     TAKE these medications    acetaminophen 500 MG tablet Commonly known as: TYLENOL Take 500-1,000 mg by mouth every 6 (six) hours as needed for moderate pain.   MULTIVITAL PO Take 1 tablet by mouth daily.   pantoprazole 40 MG tablet Commonly known as: Protonix Take 1 tablet (40 mg total) by mouth 2 (two) times daily before a meal.       Allergies  Allergen Reactions   Hydrocodone Nausea And Vomiting      Procedures/Studies: CT Angio Chest/Abd/Pel for Dissection W and/or Wo Contrast  Result Date: 09/06/2022 CLINICAL DATA:  47 year old female with history of hypertension presenting with signs and symptoms of possible acute aortic syndrome. EXAM: CT ANGIOGRAPHY CHEST, ABDOMEN AND PELVIS  TECHNIQUE: Non-contrast CT of the chest was initially obtained. Multidetector CT imaging through the chest, abdomen and pelvis was performed using the standard protocol during bolus administration of intravenous contrast. Multiplanar reconstructed images and MIPs were obtained and reviewed to evaluate the vascular anatomy. RADIATION DOSE REDUCTION: This exam was performed according to the departmental dose-optimization program which includes automated exposure control, adjustment of the mA and/or kV according to patient size and/or use of iterative reconstruction technique. CONTRAST:  OMNIPAQUE IOHEXOL 350 MG/ML SOLN COMPARISON:  Chest CTA 08/09/2014. CT of the abdomen and pelvis 06/02/2015. FINDINGS: CTA CHEST FINDINGS Cardiovascular: Precontrast images demonstrate no crescentic high attenuation associated with the wall of the thoracic aorta to suggest the presence of acute intramural hemorrhage. Postcontrast images demonstrate no evidence of thoracic aortic aneurysm or dissection. Thoracic aorta is normal in caliber measuring 3.5 cm, 2.6 cm and 2.4 cm in diameter in the ascending thoracic aorta, mid arch and descending thoracic aorta respectively. No atherosclerotic calcifications are noted in the thoracic aorta or the coronary arteries. Heart size is normal. There is no significant pericardial fluid, thickening or pericardial calcification. Mediastinum/Nodes: No pathologically enlarged mediastinal or hilar lymph nodes. Esophagus is unremarkable in appearance. No axillary lymphadenopathy. Lungs/Pleura: Mild centrilobular and paraseptal emphysema. Small calcified granuloma in the right upper lobe. No other suspicious appearing pulmonary nodules or masses are noted. No pneumothorax. No acute consolidative airspace disease. No pleural effusions. Musculoskeletal: There are no aggressive appearing lytic or blastic lesions noted in the visualized portions of the skeleton. Review of the MIP images confirms the  above findings. CTA ABDOMEN AND PELVIS FINDINGS VASCULAR Aorta: Mild aortic atherosclerosis. Normal caliber aorta without aneurysm, dissection, vasculitis or significant stenosis. Celiac: Patent without evidence of aneurysm, dissection, vasculitis or significant stenosis. SMA: Patent without evidence of aneurysm, dissection, vasculitis or significant stenosis. Renals: Both renal arteries are patent without evidence of aneurysm, dissection, vasculitis, fibromuscular dysplasia or significant stenosis. IMA: Patent without evidence of aneurysm, dissection, vasculitis or significant stenosis. Inflow: Patent without evidence of aneurysm, dissection, vasculitis or significant stenosis. Veins: No obvious venous abnormality within the limitations of this arterial phase study. Review of the MIP images confirms the above findings. NON-VASCULAR Hepatobiliary: No definite suspicious cystic or solid hepatic lesions are noted on today's arterial phase examination. Status post cholecystectomy. No intra or extrahepatic biliary ductal dilatation. Pancreas: No pancreatic mass. No pancreatic ductal dilatation. No  pancreatic or peripancreatic fluid collections or inflammatory changes. Spleen: Unremarkable. Adrenals/Urinary Tract: Bilateral kidneys and adrenal glands are normal in appearance. No hydroureteronephrosis. Urinary bladder is normal in appearance. Stomach/Bowel: Stomach is unremarkable in appearance. No pathologic dilatation of small bowel or colon. The appendix is not confidently identified and may be surgically absent. Regardless, there are no inflammatory changes noted adjacent to the cecum to suggest the presence of an acute appendicitis at this time. Lymphatic: No lymphadenopathy noted in the abdomen or pelvis. Reproductive: Uterus and ovaries are unremarkable in appearance. Other: Small umbilical hernia containing omental fat. No significant volume of ascites. No pneumoperitoneum. Musculoskeletal: There are no aggressive  appearing lytic or blastic lesions noted in the visualized portions of the skeleton. Review of the MIP images confirms the above findings. IMPRESSION: 1. No findings to suggest acute aortic syndrome. 2. No acute findings are noted in the abdomen or pelvis to account for the patient's symptoms. 3. Mild centrilobular and paraseptal emphysema. 4. Mild atherosclerosis of the abdominal aorta. 5. Small umbilical hernia containing only omental fat. No associated bowel incarceration or obstruction at this time. Electronically Signed   By: Trudie Reed M.D.   On: 09/06/2022 06:36   DG Chest 2 View  Result Date: 09/06/2022 CLINICAL DATA:  Chest pain. EXAM: CHEST - 2 VIEW COMPARISON:  AP chest 07/12/2017 FINDINGS: There is mild cardiomegaly. No vascular congestion is seen. The mediastinum is normally outlined. The lungs are clear. There is age advanced degenerative disc disease at multiple thoracic spine levels spread no acute osseous findings. Two pericardial and stable chest. IMPRESSION: No active cardiopulmonary disease. Mild cardiomegaly. 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 Electronically Signed   By: Almira Bar M.D.   On: 09/06/2022 04:29      Subjective: Seen at midday post EGD earlier in the morning.  No complaints reported.  No pain, nausea or vomiting.  Has tolerated regular consistency diet post EGD.  Wishes to have lunch prior to discharge.  Discharge Exam:  Vitals:   09/08/22 0809 09/08/22 0903 09/08/22 0910 09/08/22 0920  BP: (!) 175/99 138/85 (!) 120/56 137/67  Pulse: 67 78 77 70  Resp: 15 16 13 15   Temp: 97.8 F (36.6 C) 98 F (36.7 C)    TempSrc: Temporal Temporal    SpO2: 99% 100% 100% 100%    General exam: Young female, moderately built and thinly nourished lying comfortably supine in bed without distress. Respiratory system: Clear to auscultation. Respiratory effort normal. Cardiovascular system: S1 & S2 heard, RRR. No JVD, murmurs, rubs, gallops  or clicks. No pedal edema.  Telemetry personally reviewed: Sinus rhythm. Gastrointestinal system: Abdomen is nondistended, soft and nontender. No organomegaly or masses felt. Normal bowel sounds heard. Central nervous system: Alert and oriented. No focal neurological deficits. Extremities: Symmetric 5 x 5 power. Skin: No rashes, lesions or ulcers Psychiatry: Judgement and insight appear normal. Mood & affect appropriate.    The results of significant diagnostics from this hospitalization (including imaging, microbiology, ancillary and laboratory) are listed below for reference.     Microbiology: No results found for this or any previous visit (from the past 240 hour(s)).   Labs: CBC: Recent Labs  Lab 09/06/22 0246 09/06/22 1249 09/06/22 1821 09/07/22 0111 09/07/22 0759 09/07/22 1500 09/08/22 0443  WBC 11.9*  --   --  8.7  --   --  8.0  NEUTROABS 9.5*  --   --  5.7  --   --   --  HGB 11.2*   < > 10.3* 10.8*  10.8* 11.5* 11.5* 11.3*  HCT 34.5*   < > 32.0* 34.3*  34.6* 36.6 36.5 35.4*  MCV 89.1  --   --  91.8  --   --  91.5  PLT 398  --   --  394  --   --  372   < > = values in this interval not displayed.    Basic Metabolic Panel: Recent Labs  Lab 09/06/22 0246 09/06/22 0433 09/06/22 1249 09/07/22 0759 09/08/22 0443  NA 136  --  135 136 136  K <2.0* 2.7* 3.2* 3.2* 4.6  CL 108  --  106 109 105  CO2 22  --  23 23 24   GLUCOSE 94  --  90 81 89  BUN 13  --  8 <5* <5*  CREATININE 0.63  --  0.57 0.43* 0.53  CALCIUM 6.8*  --  7.8* 8.1* 8.5*  MG  --  1.9  --   --  1.8  PHOS  --  4.5  --   --   --     Liver Function Tests: Recent Labs  Lab 09/06/22 0246  AST 13*  ALT 10  ALKPHOS 49  BILITOT 0.6  PROT 6.1*  ALBUMIN 2.9*     Time coordinating discharge: 25 minutes  SIGNED:  Marcellus Scott, MD,  FACP, FHM, SFHM, [facility]   [facility] [place]     To contact the attending provider between 7A-7P or the covering  provider during after hours 7P-7A, please log into the web site [email or web address] and access using universal [place] password for that web site. If you do not have the password, please call the hospital operator.

## 2022-09-08 NOTE — Anesthesia Postprocedure Evaluation (Signed)
Anesthesia Post Note  Patient: [name]  Procedure(s) Performed: ESOPHAGOGASTRODUODENOSCOPY (EGD) WITH PROPOFOL ESOPHAGOGASTRODUODENOSCOPY (EGD) BIOPSY     Patient location during evaluation: PACU Anesthesia Type: MAC Level of consciousness: awake Pain management: pain level controlled Vital Signs Assessment: post-procedure vital signs reviewed and stable Respiratory status: spontaneous breathing, nonlabored ventilation and respiratory function stable Cardiovascular status: stable and blood pressure returned to baseline Postop Assessment: no apparent nausea or vomiting Anesthetic complications: no   No notable events documented.  Last Vitals:  Vitals:   09/08/22 0903 09/08/22 0910  BP: 138/85   Pulse: 78 77  Resp: 16 13  Temp: 36.7 C   SpO2: 100% 100%    Last Pain:  Vitals:   09/08/22 0903  TempSrc: Temporal  PainSc: (P) Asleep                 Linton Rump

## 2022-09-08 NOTE — Transfer of Care (Signed)
Immediate Anesthesia Transfer of Care Note  Patient: [name]  Procedure(s) Performed: ESOPHAGOGASTRODUODENOSCOPY (EGD) WITH PROPOFOL ESOPHAGOGASTRODUODENOSCOPY (EGD) BIOPSY  Patient Location: PACU  Anesthesia Type:MAC  Level of Consciousness: awake, alert , and oriented  Airway & Oxygen Therapy: Patient Spontanous Breathing and Patient connected to face mask oxygen  Post-op Assessment: Report given to RN and Post -op Vital signs reviewed and stable  Post vital signs: Reviewed and stable  Last Vitals:  Vitals Value Taken Time  BP    Temp    Pulse 71 09/08/22 0903  Resp 16 09/08/22 0903  SpO2 100 % 09/08/22 0903  Vitals shown include unvalidated device data.  Last Pain:  Vitals:   09/08/22 0809  TempSrc: Temporal  PainSc: 8          Complications: No notable events documented.

## 2022-09-08 NOTE — Op Note (Signed)
[facility] Patient Name: [name] Procedure Date: 09/08/2022 MRN: [id number] Attending MD: Kerin Salen , MD, [id number] Date of Birth: Dec 21, 1975 CSN: [id number] Age: 47 Admit Type: Inpatient Procedure:                Upper GI endoscopy Indications:              Abdominal pain in the left upper quadrant,                            Coffee-ground emesis, Melena Providers:                Kerin Salen, MD, Marge Duncans, RN, Harrington Challenger,                            Technician Referring MD:             [facility] Medicines:                See the Anesthesia note for documentation of the                            administered medications Complications:            No immediate complications. Estimated blood loss:                            Minimal. Estimated Blood Loss:     Estimated blood loss was minimal. Procedure:                Pre-Anesthesia Assessment:                           - Prior to the procedure, a History and Physical                            was performed, and patient medications and                            allergies were reviewed. The patient's tolerance of                            previous anesthesia was also reviewed. The risks                            and benefits of the procedure and the sedation                            options and risks were discussed with the patient.                            All questions were answered, and informed consent                            was obtained. Prior Anticoagulants: The patient has                            taken  no anticoagulant or antiplatelet agents. ASA                            Grade Assessment: III - A patient with severe                            systemic disease. After reviewing the risks and                            benefits, the patient was deemed in satisfactory                            condition to undergo the procedure.                           After obtaining informed  consent, the endoscope was                            passed under direct vision. Throughout the                            procedure, the patient's blood pressure, pulse, and                            oxygen saturations were monitored continuously. The                            [id number]-[id number] ([id number]) Olympus endoscope was introduced                            through the mouth, and advanced to the second part                            of duodenum. The upper GI endoscopy was                            accomplished without difficulty. The patient                            tolerated the procedure well. Scope In: Scope Out: Findings:      The upper third of the esophagus and middle third of the esophagus were       normal.      A widely patent Schatzki ring was found at the gastroesophageal junction.      LA Grade A (one or more mucosal breaks less than 5 mm, not extending       between tops of 2 mucosal folds) esophagitis with no bleeding was found       35 cm from the incisors. Biopsies were taken with a cold forceps for       histology.      A 2 cm hiatal hernia was present.      One non-bleeding cratered gastric ulcer with a clean ulcer base (Forrest       Class III) was found at the pylorus. The lesion was 10 mm in largest  dimension. Biopsies were taken with a cold forceps for Helicobacter       pylori testing.      The cardia and gastric fundus were normal on retroflexion.      A severe post-ulcer deformity was found in the duodenal bulb with       thickened folds.      The first portion of the duodenum and second portion of the duodenum       were normal. Impression:               - Normal upper third of esophagus and middle third                            of esophagus.                           - Widely patent Schatzki ring.                           - LA Grade A reflux esophagitis with no bleeding.                            Biopsied.                           - 2 cm  hiatal hernia.                           - Non-bleeding gastric ulcer with a clean ulcer                            base (Forrest Class III). Biopsied.                           - Duodenal deformity.                           - Normal first portion of the duodenum and second                            portion of the duodenum. Moderate Sedation:      Patient did not receive moderate sedation for this procedure, but       instead received monitored anesthesia care. Recommendation:           - Resume regular diet.                           - Continue present medications. Will need PPI BID                            for 2 months.                           - Await pathology results.                           - Repeat upper endoscopy in 2 months to check  healing. Procedure Code(s):        --- Professional ---                           [phone], Esophagogastroduodenoscopy, flexible,                            transoral; with biopsy, single or multiple Diagnosis Code(s):        --- Professional ---                           K22.2, Esophageal obstruction                           K21.00, Gastro-esophageal reflux disease with                            esophagitis, without bleeding                           K44.9, Diaphragmatic hernia without obstruction or                            gangrene                           K25.9, Gastric ulcer, unspecified as acute or                            chronic, without hemorrhage or perforation                           K31.89, Other diseases of stomach and duodenum                           R10.12, Left upper quadrant pain                           K92.0, Hematemesis                           K92.1, Melena (includes Hematochezia) CPT copyright 2022 American Medical Association. All rights reserved. The codes documented in this report are preliminary and upon coder review may  be revised to meet current compliance requirements. Kerin Salen, MD 09/08/2022 9:00:49 AM This report has been signed electronically. Number of Addenda: 0

## 2022-09-08 NOTE — Discharge Instructions (Signed)

## 2022-09-08 NOTE — Interval H&P Note (Signed)
History and Physical Interval Note: 46/female with coffee ground emesis, black stools and LUQ pain for EGD with propofol. 09/08/2022 8:31 AM  [name]  has presented today for EGD, with the diagnosis of melena, LUQ pain, coffee ground emesis.  The various methods of treatment have been discussed with the patient and family. After consideration of risks, benefits and other options for treatment, the patient has consented to  Procedure(s): ESOPHAGOGASTRODUODENOSCOPY (EGD) WITH PROPOFOL (N/A) ESOPHAGOGASTRODUODENOSCOPY (EGD) (N/A) as a surgical intervention.  The patient's history has been reviewed, patient examined, no change in status, stable for surgery.  I have reviewed the patient's chart and labs.  Questions were answered to the patient's satisfaction.     Kerin Salen

## 2022-09-08 NOTE — Anesthesia Preprocedure Evaluation (Addendum)
Anesthesia Evaluation  Patient identified by MRN, date of birth, ID band Patient awake    Reviewed: Allergy & Precautions, NPO status , Patient's Chart, lab work & pertinent test results  History of Anesthesia Complications Negative for: history of anesthetic complications  Airway Mallampati: III  TM Distance: >3 FB Neck ROM: Full    Dental  (+) Dental Advisory Given Missing several teeth. Denies loose teeth.:   Pulmonary neg shortness of breath, sleep apnea (does not use CPAP) , neg COPD, neg recent URI, Current Smoker and Patient abstained from smoking.   Pulmonary exam normal breath sounds clear to auscultation       Cardiovascular hypertension, (-) angina (-) Past MI, (-) Cardiac Stents and (-) CABG (-) dysrhythmias  Rhythm:Regular Rate:Normal     Neuro/Psych  PSYCHIATRIC DISORDERS Anxiety Depression    negative neurological ROS     GI/Hepatic PUD,GERD  ,,(+)     substance abuse  cocaine use and marijuana useChronic abdominal pain   Endo/Other  negative endocrine ROS    Renal/GU negative Renal ROS     Musculoskeletal   Abdominal   Peds  Hematology  (+) Blood dyscrasia, anemia   Anesthesia Other Findings 47 year old female with medical history significant for hypertension, GERD, esophagitis, duodenal ulcer, polysubstance abuse (cocaine, opiates, THC, tobacco) acute blood loss anemia from GI bleed, depression, presented to the ED with complaints of progressive abdominal pain x 2 days with associated nausea, vomiting, some episodes of hematemesis and melena.  She has been using Goody's powder lately but also took Pepto-Bismol.  Admitted for suspected acute upper GI bleed due to PUD.  Reports last cocaine use either the day of (6/28) or the day prior to admission. Last vomited 2 days ago.  Reproductive/Obstetrics                             Anesthesia Physical Anesthesia Plan  ASA:  3  Anesthesia Plan: MAC   Post-op Pain Management:    Induction: Intravenous  PONV Risk Score and Plan: 1 and Propofol infusion and Treatment may vary due to age or medical condition  Airway Management Planned: Natural Airway and Nasal Cannula  Additional Equipment:   Intra-op Plan:   Post-operative Plan:   Informed Consent: I have reviewed the patients History and Physical, chart, labs and discussed the procedure including the risks, benefits and alternatives for the proposed anesthesia with the patient or authorized representative who has indicated his/her understanding and acceptance.     Dental advisory given  Plan Discussed with: CRNA and Anesthesiologist  Anesthesia Plan Comments: (Discussed with patient risks of MAC including, but not limited to, minor pain or discomfort, hearing people in the room, and possible need for backup general anesthesia. Risks for general anesthesia also discussed including, but not limited to, sore throat, hoarse voice, chipped/damaged teeth, injury to vocal cords, nausea and vomiting, allergic reactions, lung infection, heart attack, stroke, and death. All questions answered. )        Anesthesia Quick Evaluation

## 2022-09-10 LAB — SURGICAL PATHOLOGY

## 2022-11-11 ENCOUNTER — Inpatient Hospital Stay (HOSPITAL_COMMUNITY)
Admission: EM | Admit: 2022-11-11 | Discharge: 2022-11-14 | DRG: 377 | Disposition: A | Discharge status: Home / Self Care | Payer: Medicaid Other | Attending: Internal Medicine | Admitting: Internal Medicine

## 2022-11-11 ENCOUNTER — Other Ambulatory Visit: Payer: Self-pay

## 2022-11-11 ENCOUNTER — Emergency Department (HOSPITAL_COMMUNITY): Payer: Medicaid Other

## 2022-11-11 ENCOUNTER — Encounter (HOSPITAL_COMMUNITY): Payer: Self-pay | Admitting: Emergency Medicine

## 2022-11-11 DIAGNOSIS — K2101 Gastro-esophageal reflux disease with esophagitis, with bleeding: Secondary | ICD-10-CM | POA: Diagnosis present

## 2022-11-11 DIAGNOSIS — Z9049 Acquired absence of other specified parts of digestive tract: Secondary | ICD-10-CM | POA: Diagnosis not present

## 2022-11-11 DIAGNOSIS — K922 Gastrointestinal hemorrhage, unspecified: Secondary | ICD-10-CM | POA: Diagnosis not present

## 2022-11-11 DIAGNOSIS — F1110 Opioid abuse, uncomplicated: Secondary | ICD-10-CM | POA: Diagnosis present

## 2022-11-11 DIAGNOSIS — F1410 Cocaine abuse, uncomplicated: Secondary | ICD-10-CM | POA: Diagnosis present

## 2022-11-11 DIAGNOSIS — F1210 Cannabis abuse, uncomplicated: Secondary | ICD-10-CM | POA: Diagnosis present

## 2022-11-11 DIAGNOSIS — D259 Leiomyoma of uterus, unspecified: Secondary | ICD-10-CM | POA: Diagnosis present

## 2022-11-11 DIAGNOSIS — E876 Hypokalemia: Secondary | ICD-10-CM | POA: Diagnosis present

## 2022-11-11 DIAGNOSIS — F17210 Nicotine dependence, cigarettes, uncomplicated: Secondary | ICD-10-CM | POA: Diagnosis present

## 2022-11-11 DIAGNOSIS — Z823 Family history of stroke: Secondary | ICD-10-CM | POA: Diagnosis not present

## 2022-11-11 DIAGNOSIS — K920 Hematemesis: Secondary | ICD-10-CM

## 2022-11-11 DIAGNOSIS — D62 Acute posthemorrhagic anemia: Secondary | ICD-10-CM | POA: Diagnosis present

## 2022-11-11 DIAGNOSIS — K254 Chronic or unspecified gastric ulcer with hemorrhage: Secondary | ICD-10-CM | POA: Diagnosis present

## 2022-11-11 DIAGNOSIS — Z833 Family history of diabetes mellitus: Secondary | ICD-10-CM | POA: Diagnosis not present

## 2022-11-11 DIAGNOSIS — F1910 Other psychoactive substance abuse, uncomplicated: Secondary | ICD-10-CM | POA: Diagnosis present

## 2022-11-11 DIAGNOSIS — R1013 Epigastric pain: Principal | ICD-10-CM

## 2022-11-11 DIAGNOSIS — Z681 Body mass index (BMI) 19 or less, adult: Secondary | ICD-10-CM | POA: Diagnosis not present

## 2022-11-11 DIAGNOSIS — Z8249 Family history of ischemic heart disease and other diseases of the circulatory system: Secondary | ICD-10-CM

## 2022-11-11 DIAGNOSIS — K449 Diaphragmatic hernia without obstruction or gangrene: Secondary | ICD-10-CM | POA: Diagnosis present

## 2022-11-11 DIAGNOSIS — I10 Essential (primary) hypertension: Secondary | ICD-10-CM | POA: Diagnosis present

## 2022-11-11 DIAGNOSIS — N9489 Other specified conditions associated with female genital organs and menstrual cycle: Secondary | ICD-10-CM

## 2022-11-11 DIAGNOSIS — K259 Gastric ulcer, unspecified as acute or chronic, without hemorrhage or perforation: Secondary | ICD-10-CM | POA: Diagnosis not present

## 2022-11-11 DIAGNOSIS — K222 Esophageal obstruction: Secondary | ICD-10-CM | POA: Diagnosis present

## 2022-11-11 LAB — COMPREHENSIVE METABOLIC PANEL
ALT: 9 U/L (ref 0–44)
AST: 16 U/L (ref 15–41)
Albumin: 2.9 g/dL — ABNORMAL LOW (ref 3.5–5.0)
Alkaline Phosphatase: 56 U/L (ref 38–126)
Anion gap: 9 (ref 5–15)
BUN: 7 mg/dL (ref 6–20)
CO2: 26 mmol/L (ref 22–32)
Calcium: 9.2 mg/dL (ref 8.9–10.3)
Chloride: 101 mmol/L (ref 98–111)
Creatinine, Ser: 0.54 mg/dL (ref 0.44–1.00)
GFR, Estimated: >60 mL/min (ref >60)
Glucose, Bld: 83 mg/dL (ref 70–99)
Potassium: 3.3 mmol/L — ABNORMAL LOW (ref 3.5–5.1)
Sodium: 136 mmol/L (ref 135–145)
Total Bilirubin: 0.3 mg/dL (ref 0.3–1.2)
Total Protein: 7.7 g/dL (ref 6.5–8.1)

## 2022-11-11 LAB — CBC WITH DIFFERENTIAL/PLATELET
Abs Immature Granulocytes: 0.19 10*3/uL — ABNORMAL HIGH (ref 0.00–0.07)
Basophils Absolute: 0.1 10*3/uL (ref 0.0–0.1)
Basophils Relative: 1 %
Eosinophils Absolute: 0.1 10*3/uL (ref 0.0–0.5)
Eosinophils Relative: 1 %
HCT: 28.2 % — ABNORMAL LOW (ref 36.0–46.0)
Hemoglobin: 8.9 g/dL — ABNORMAL LOW (ref 12.0–15.0)
Immature Granulocytes: 2 %
Lymphocytes Relative: 12 %
Lymphs Abs: 1.3 10*3/uL (ref 0.7–4.0)
MCH: 26.3 pg (ref 26.0–34.0)
MCHC: 31.6 g/dL (ref 30.0–36.0)
MCV: 83.2 fL (ref 80.0–100.0)
Monocytes Absolute: 0.6 10*3/uL (ref 0.1–1.0)
Monocytes Relative: 5 %
Neutro Abs: 8.3 10*3/uL — ABNORMAL HIGH (ref 1.7–7.7)
Neutrophils Relative %: 79 %
Platelets: 714 10*3/uL — ABNORMAL HIGH (ref 150–400)
RBC: 3.39 MIL/uL — ABNORMAL LOW (ref 3.87–5.11)
RDW: 15.5 % (ref 11.5–15.5)
WBC: 10.4 10*3/uL (ref 4.0–10.5)
nRBC: 0 % (ref 0.0–0.2)

## 2022-11-11 LAB — TROPONIN I (HIGH SENSITIVITY)
Troponin I (High Sensitivity): 6 ng/L (ref <18)
Troponin I (High Sensitivity): 4 ng/L (ref <18)

## 2022-11-11 LAB — RAPID URINE DRUG SCREEN, HOSP PERFORMED
Amphetamines: NOT DETECTED
Barbiturates: NOT DETECTED
Benzodiazepines: NOT DETECTED
Cocaine: POSITIVE — AB
Opiates: POSITIVE — AB
Tetrahydrocannabinol: POSITIVE — AB

## 2022-11-11 LAB — POC OCCULT BLOOD, ED: Fecal Occult Bld: NEGATIVE

## 2022-11-11 LAB — HCG, QUANTITATIVE, PREGNANCY: hCG, Beta Chain, Quant, S: <1 m[IU]/mL (ref <5)

## 2022-11-11 LAB — HEMOGLOBIN AND HEMATOCRIT, BLOOD
HCT: 28 % — ABNORMAL LOW (ref 36.0–46.0)
HCT: 28.9 % — ABNORMAL LOW (ref 36.0–46.0)
Hemoglobin: 8.8 g/dL — ABNORMAL LOW (ref 12.0–15.0)
Hemoglobin: 9 g/dL — ABNORMAL LOW (ref 12.0–15.0)

## 2022-11-11 LAB — LIPASE, BLOOD: Lipase: 29 U/L (ref 11–51)

## 2022-11-11 MED ORDER — PANTOPRAZOLE SODIUM 40 MG IV SOLR: 40.0000 mg | Freq: Two times a day (BID) | INTRAVENOUS | Status: DC

## 2022-11-11 MED ORDER — PANTOPRAZOLE 80MG IVPB - SIMPLE MED
80.0000 mg | Freq: Once | INTRAVENOUS | Status: AC
  Administered 2022-11-11: 80 mg via INTRAVENOUS
  Filled 2022-11-11: qty 80

## 2022-11-11 MED ORDER — TRAZODONE HCL 50 MG PO TABS
25.0000 mg | ORAL_TABLET | Freq: Every evening | ORAL | Status: DC | PRN
  Administered 2022-11-11 – 2022-11-13 (×2): 25 mg via ORAL
  Filled 2022-11-11 (×2): qty 1

## 2022-11-11 MED ORDER — HYDRALAZINE HCL 20 MG/ML IJ SOLN
5.0000 mg | Freq: Four times a day (QID) | INTRAMUSCULAR | Status: DC | PRN
  Administered 2022-11-12: 5 mg via INTRAVENOUS
  Filled 2022-11-11: qty 1

## 2022-11-11 MED ORDER — POTASSIUM CHLORIDE CRYS ER 20 MEQ PO TBCR
40.0000 meq | EXTENDED_RELEASE_TABLET | Freq: Once | ORAL | Status: AC
  Administered 2022-11-11: 40 meq via ORAL
  Filled 2022-11-11: qty 2

## 2022-11-11 MED ORDER — IOHEXOL 300 MG/ML  SOLN
100.0000 mL | Freq: Once | INTRAMUSCULAR | Status: AC | PRN
  Administered 2022-11-11: 100 mL via INTRAVENOUS

## 2022-11-11 MED ORDER — SODIUM CHLORIDE 0.9 % IV SOLN
INTRAVENOUS | Status: DC
  Administered 2022-11-12: 50 mL/h via INTRAVENOUS

## 2022-11-11 MED ORDER — ALBUTEROL SULFATE HFA 108 (90 BASE) MCG/ACT IN AERS: 2.0000 | INHALATION_SPRAY | RESPIRATORY_TRACT | Status: DC | PRN

## 2022-11-11 MED ORDER — HYDROMORPHONE HCL 1 MG/ML IJ SOLN
0.5000 mg | INTRAMUSCULAR | Status: DC | PRN
  Administered 2022-11-11 – 2022-11-12 (×12): 1 mg via INTRAVENOUS
  Filled 2022-11-11 (×12): qty 1

## 2022-11-11 MED ORDER — ALBUTEROL SULFATE (2.5 MG/3ML) 0.083% IN NEBU: 2.5000 mg | INHALATION_SOLUTION | RESPIRATORY_TRACT | Status: DC | PRN

## 2022-11-11 MED ORDER — ONDANSETRON HCL 4 MG PO TABS: 4.0000 mg | ORAL_TABLET | Freq: Four times a day (QID) | ORAL | Status: DC | PRN

## 2022-11-11 MED ORDER — OXYCODONE HCL 5 MG PO TABS
5.0000 mg | ORAL_TABLET | ORAL | Status: DC | PRN
  Administered 2022-11-11 – 2022-11-14 (×10): 5 mg via ORAL
  Filled 2022-11-11 (×10): qty 1

## 2022-11-11 MED ORDER — FAMOTIDINE IN NACL 20-0.9 MG/50ML-% IV SOLN
20.0000 mg | Freq: Once | INTRAVENOUS | Status: AC
  Administered 2022-11-11: 20 mg via INTRAVENOUS
  Filled 2022-11-11: qty 50

## 2022-11-11 MED ORDER — ACETAMINOPHEN 325 MG PO TABS
650.0000 mg | ORAL_TABLET | Freq: Four times a day (QID) | ORAL | Status: DC | PRN
  Administered 2022-11-11 – 2022-11-14 (×3): 650 mg via ORAL
  Filled 2022-11-11 (×4): qty 2

## 2022-11-11 MED ORDER — ACETAMINOPHEN 650 MG RE SUPP: 650.0000 mg | Freq: Four times a day (QID) | RECTAL | Status: DC | PRN

## 2022-11-11 MED ORDER — ENSURE ENLIVE PO LIQD
237.0000 mL | Freq: Two times a day (BID) | ORAL | Status: DC
  Administered 2022-11-12 – 2022-11-14 (×4): 237 mL via ORAL

## 2022-11-11 MED ORDER — ONDANSETRON HCL 4 MG/2ML IJ SOLN: 4.0000 mg | Freq: Four times a day (QID) | INTRAMUSCULAR | Status: DC | PRN

## 2022-11-11 MED ORDER — PANTOPRAZOLE SODIUM 40 MG IV SOLR
40.0000 mg | Freq: Once | INTRAVENOUS | Status: AC
  Administered 2022-11-11: 40 mg via INTRAVENOUS
  Filled 2022-11-11: qty 10

## 2022-11-11 MED ORDER — PANTOPRAZOLE INFUSION (NEW) - SIMPLE MED
8.0000 mg/h | INTRAVENOUS | Status: DC
  Administered 2022-11-11 – 2022-11-13 (×5): 8 mg/h via INTRAVENOUS
  Filled 2022-11-11: qty 100
  Filled 2022-11-11 (×3): qty 80
  Filled 2022-11-11: qty 100
  Filled 2022-11-11: qty 80
  Filled 2022-11-11 (×2): qty 100

## 2022-11-11 NOTE — Consult Note (Signed)
[facility] Gastroenterology Consult  Referring Provider: No ref. provider found Primary Care Physician:  Patient, No Pcp Per Primary Gastroenterologist: Unassigned  Reason for Consultation: Hematemesis  SUBJECTIVE:   HPI: [name] is a 47 y.o. female with past medical history significant for polysubstance use, history of peptic ulcer disease. Presented to hospital on 11/11/22 with 4-day history of hematemesis. She noted that she has been having nausea and hematemesis, she has been unable to eat a significant amount. No dysphagia. She has chronic RUQ pain. She has pain in her right upper extremity as well. She takes alternating Tylenol and Ibuprofen. She is not on PPI therapy. She is having brown bowel movements. She denied alcohol use. Noted using intra-nasal cocaine, last use was 4-5 days prior. No marijuana use.  She is known to [facility], she underwent EGD on 09/08/2022 (Dr. Marca Ancona) for coffee ground emesis and melena with findings of LA grade A esophagitis, 2 cm hiatal hernia, 10 mm pylorus non-bleeding and cratered ulceration, severe post-ulceration deformity in the duodenal bulb. Pathology showed duodenal mucosa with mild peptic change, negative for H.Pylori. GE junction biopsy showed unremarkable squamous mucosa negative for inflammation or eosinophilic esophagitis. She had duodenal ulcer in 2017 which required epinephrine administration and bipolar cautery.  Labs on presentation showed Hgb 8.9 (was 11.3 on 09/08/22), WBC 10.4, PLT 714, FOBT (-). CT scan of abdomen and pelvis completed 11/11/22 showed stomach within normal limit, no inflammatory changes, right sided adnexal lesion, small hiatal hernia. She is on IV PPI therapy. She noted that she has had 2 further episodes of hematemesis since arrival, ED provider denied further episodes of hematemesis.   Past Medical History:  Diagnosis Date   Anemia 05/2015   due to blood loss from duodenal ulcer.  transfused 3 PRBCs.    Chronic abdominal  pain 2008   ? IBS.  multiple CT scans: enlarged uterus.   Depression    with mood swings   Duodenal ulcer 05/2015   Esophagitis 2011   GERD (gastroesophageal reflux disease)    Hypertension    Obesity 2011   Polysubstance abuse (HCC) 09/28/2010   cocaine, opiates, THC, tobacco.      Past Surgical History:  Procedure Laterality Date   BIOPSY  09/08/2022   Procedure: BIOPSY;  Surgeon: Kerin Salen, MD;  Location: [facility];  Service: Gastroenterology;;   CHOLECYSTECTOMY  2004   ESOPHAGOGASTRODUODENOSCOPY N/A 06/04/2015   Procedure: ESOPHAGOGASTRODUODENOSCOPY (EGD);  Surgeon: Meryl Dare, MD;  Location: [facility];  Service: Endoscopy;  Laterality: N/A;   ESOPHAGOGASTRODUODENOSCOPY (EGD) WITH PROPOFOL N/A 09/08/2022   Procedure: ESOPHAGOGASTRODUODENOSCOPY (EGD) WITH PROPOFOL;  Surgeon: Kerin Salen, MD;  Location: [facility];  Service: Gastroenterology;  Laterality: N/A;   LAPAROSCOPIC APPENDECTOMY  11/2006   Dr Abbey Chatters   TONSILLECTOMY     TUBAL LIGATION     Prior to Admission medications   Medication Sig Start Date End Date Taking? Authorizing Provider  acetaminophen (TYLENOL) 500 MG tablet Take 500-1,000 mg by mouth every 6 (six) hours as needed for moderate pain.    [provider]  Multiple Vitamins-Minerals (MULTIVITAL PO) Take 1 tablet by mouth daily.    [provider]  pantoprazole (PROTONIX) 40 MG tablet Take 1 tablet (40 mg total) by mouth 2 (two) times daily before a meal. 09/08/22 11/07/22  Elease Etienne, MD   Current Facility-Administered Medications  Medication Dose Route Frequency Provider Last Rate Last Admin   0.9 %  sodium chloride infusion   Intravenous Continuous Kirby Crigler,  Mir M, MD 50 mL/hr at 11/11/22 1010 New Bag at 11/11/22 1010   acetaminophen (TYLENOL) tablet 650 mg  650 mg Oral Q6H PRN Kirby Crigler, Mir M, MD       Or   acetaminophen (TYLENOL) suppository 650 mg  650 mg Rectal Q6H PRN Kirby Crigler, Mir M, MD       albuterol  (PROVENTIL) (2.5 MG/3ML) 0.083% nebulizer solution 2.5 mg  2.5 mg Nebulization Q2H PRN Kirby Crigler, Mir M, MD       HYDROmorphone (DILAUDID) injection 0.5-1 mg  0.5-1 mg Intravenous Q2H PRN Kirby Crigler, Mir M, MD   1 mg at 11/11/22 1010   ondansetron (ZOFRAN) tablet 4 mg  4 mg Oral Q6H PRN Kirby Crigler, Mir M, MD       Or   ondansetron [facility]) injection 4 mg  4 mg Intravenous Q6H PRN Kirby Crigler, Mir M, MD       oxyCODONE (Oxy IR/ROXICODONE) immediate release tablet 5 mg  5 mg Oral Q4H PRN Kirby Crigler, Mir M, MD       pantoprazole (PROTONIX) 80 mg /NS 100 mL IVPB  80 mg Intravenous Once Kirby Crigler, Mir M, MD       Melene Muller ON 11/14/2022] pantoprazole (PROTONIX) injection 40 mg  40 mg Intravenous Q12H Kirby Crigler, Mir M, MD       pantoprozole (PROTONIX) 80 mg /NS 100 mL infusion  8 mg/hr Intravenous Continuous Kirby Crigler, Mir M, MD 10 mL/hr at 11/11/22 1012 8 mg/hr at 11/11/22 1012   traZODone (DESYREL) tablet 25 mg  25 mg Oral QHS PRN Maryln Gottron, MD       Current Outpatient Medications  Medication Sig Dispense Refill   acetaminophen (TYLENOL) 500 MG tablet Take 500-1,000 mg by mouth every 6 (six) hours as needed for moderate pain.     Multiple Vitamins-Minerals (MULTIVITAL PO) Take 1 tablet by mouth daily.     pantoprazole (PROTONIX) 40 MG tablet Take 1 tablet (40 mg total) by mouth 2 (two) times daily before a meal. 60 tablet 1   Allergies as of 11/11/2022 - Review Complete 11/11/2022  Allergen Reaction Noted   Hydrocodone Nausea And Vomiting 11/27/2014   Family History  Problem Relation Age of Onset   Hypertension Mother    Heart disease Father 37       MI   Stroke Father 62   Diabetes Father    GER disease Sister    Cancer Maternal Grandmother        uterine and stomach   Social History   Socioeconomic History   Marital status: Single    Spouse name: Not on file   Number of children: Not on file   Years of education: Not on file   Highest education level: Not on file   Occupational History   Not on file  Tobacco Use   Smoking status: Every Day    Current packs/day: 0.30    Types: Cigarettes   Smokeless tobacco: Never  Substance and Sexual Activity   Alcohol use: No    Comment: occ   Drug use: No    Types: Cocaine, Marijuana    Comment: abstinent for 1 year   Sexual activity: Yes    Birth control/protection: Surgical  Other Topics Concern   Not on file  Social History Narrative   ** Merged History Encounter **       Lives with a partner.  Never married.  6 children ages 90-20.   Finished 12th grade.  Unemployed.  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   Social Determinants of Health   Financial Resource Strain: Not on file  Food Insecurity: Not on file  Transportation Needs: Not on file  Physical Activity: Not on file  Stress: Not on file  Social Connections: Not on file  Intimate Partner Violence: Not on file   Review of Systems:  Review of Systems  Respiratory:  Positive for shortness of breath.   Cardiovascular:  Negative for chest pain.  Gastrointestinal:  Positive for abdominal pain, nausea and vomiting. Negative for blood in stool, constipation and diarrhea.    OBJECTIVE:   Temp:  [98.1 F (36.7 C)-99.5 F (37.5 C)] 99.5 F (37.5 C) (09/02 0651) Pulse Rate:  [80-87] 87 (09/02 0651) Resp:  [17-18] 17 (09/02 0651) BP: (157-198)/(97-105) 178/99 (09/02 0651) SpO2:  [96 %-100 %] 100 % (09/02 0651) Weight:  [47.6 kg] 47.6 kg (09/02 0046)   Physical Exam Constitutional:      General: She is not in acute distress.    Appearance: She is not ill-appearing, toxic-appearing or diaphoretic.  Cardiovascular:     Rate and Rhythm: Normal rate and regular rhythm.  Pulmonary:     Effort: No respiratory distress.     Breath sounds: Normal breath sounds.  Abdominal:     General: Bowel sounds are normal. There is no distension.     Palpations: Abdomen is soft.     Tenderness: There is abdominal tenderness. There is no  guarding.  Musculoskeletal:     Right lower leg: No edema.     Left lower leg: No edema.  Skin:    General: Skin is warm and dry.  Neurological:     Mental Status: She is alert.     Labs: Recent Labs    11/11/22 0623  WBC 10.4  HGB 8.9*  HCT 28.2*  PLT 714*   BMET Recent Labs    11/11/22 0623  NA 136  K 3.3*  CL 101  CO2 26  GLUCOSE 83  BUN 7  CREATININE 0.54  CALCIUM 9.2   LFT Recent Labs    11/11/22 0623  PROT 7.7  ALBUMIN 2.9*  AST 16  ALT 9  ALKPHOS 56  BILITOT 0.3   PT/INR No results for input(s): "LABPROT", "INR" in the last 72 hours.  Diagnostic imaging: CT ABDOMEN PELVIS W CONTRAST  Result Date: 11/11/2022 CLINICAL DATA:  47 year old female with history of epigastric pain. EXAM: CT ABDOMEN AND PELVIS WITH CONTRAST TECHNIQUE: Multidetector CT imaging of the abdomen and pelvis was performed using the standard protocol following bolus administration of intravenous contrast. RADIATION DOSE REDUCTION: This exam was performed according to the departmental dose-optimization program which includes automated exposure control, adjustment of the mA and/or kV according to patient size and/or use of iterative reconstruction technique. CONTRAST:  OMNIPAQUE IOHEXOL 300 MG/ML  SOLN COMPARISON:  CTA of the chest, abdomen and pelvis 09/06/2022. FINDINGS: Comment: Portions of today's examination are limited by substantial patient respiratory motion. Lower chest: Unremarkable. Hepatobiliary: No suspicious cystic or solid hepatic lesions. Diffuse low attenuation throughout the hepatic parenchyma, indicative of a background of hepatic steatosis. No intra or extrahepatic biliary ductal dilatation. Status post cholecystectomy. Pancreas: No pancreatic mass. No pancreatic ductal dilatation. No peripancreatic fluid collections or inflammatory changes. Spleen: Unremarkable. Adrenals/Urinary Tract: Bilateral kidneys and adrenal glands are normal in appearance. No  hydroureteronephrosis. Urinary bladder is unremarkable in appearance. However, there appears to be a large urethral diverticulum which extends from approximately 9:00 to 6:00 around  the anterior and right-side of the urethra best appreciated on axial image 67 of series 2. Stomach/Bowel: The appearance of the stomach is unremarkable. No pathologic dilatation of small bowel or colon. The appendix is not confidently identified and may be surgically absent. Regardless, there are no inflammatory changes noted adjacent to the cecum to suggest the presence of an acute appendicitis at this time. Vascular/Lymphatic: Atherosclerosis in the abdominal aorta. No definite aneurysm or dissection noted in the abdominal or pelvic vasculature. Reproductive: Uterus is heterogeneous in appearance with multiple heterogeneously enhancing lesions, likely to represent small fibroids, largest of which measures approximately 2.1 cm in the uterine body (sagittal image 81 of series 10). In addition, there is a large heterogeneously enhancing lesion in the right adnexal region estimated to measure approximately 5.5 x 4.4 x 4.2 cm (axial image 52 of series 2 and coronal image 81 of series 8), concerning for potential ovarian neoplasm. Left ovary is unremarkable in appearance. Other: Small umbilical hernia containing a small amount of omental fat and some ascites. Trace volume of ascites. No pneumoperitoneum. Musculoskeletal: There are no aggressive appearing lytic or blastic lesions noted in the visualized portions of the skeleton. IMPRESSION: 1. Small umbilical hernia containing only omental fat and a trace volume of ascites. 2. Large heterogeneously enhancing right adnexal mass concerning for potential ovarian neoplasm. Further evaluation with pelvic ultrasound is strongly recommended in the near future to better evaluate this finding. Uterus is also heterogeneous in appearance, suggesting the presence of multiple fibroids. 3. Hepatic  steatosis. 4. Aortic atherosclerosis. 5. Additional incidental findings, as above. Electronically Signed   By: Trudie Reed M.D.   On: 11/11/2022 08:06   DG Chest 2 View  Result Date: 11/11/2022 CLINICAL DATA:  47 year old female with history of chest pain. EXAM: CHEST - 2 VIEW COMPARISON:  Chest x-ray 09/06/2022. FINDINGS: Lung volumes are normal. No consolidative airspace disease. No pleural effusions. No pneumothorax. No pulmonary nodule or mass noted. Pulmonary vasculature and the cardiomediastinal silhouette are within normal limits. IMPRESSION: No radiographic evidence of acute cardiopulmonary disease. Electronically Signed   By: Trudie Reed M.D.   On: 11/11/2022 07:54    IMPRESSION: Hematemesis Normocytic anemia History gastric ulcer 08/2022 History duodenal ulcer 2017 NSAID use Cocaine use   PLAN: -Large bore IV x 2 -IV PPI Q12Hr -Trend H/H, transfuse for Hgb < 7  -Recommend EGD to investigate hematemesis further, discussed procedure in detail with patient including benefits, alternatives and risks of bleeding/infection/perforation/missed lesion/anesthesia, she verbalized understanding and elected to proceed -Patient with stable clinical appearance, not an emergent procedure on holiday, will schedule procedure for 11/12/22  -Ok for diet today, NPO at midnight for EGD 11/12/22 -[facility] will follow, Dr. Bosie Clos tomorrow    LOS: 0 days   Liliane Shi, DO [facility]

## 2022-11-11 NOTE — ED Provider Notes (Signed)
Patient given in sign out by Berle Mull, PA-C.  Please review their note for patient HPI, physical exam, workup.  At this time the plan is follow-up on labs and imaging and reach out to [facility] to see if they believe patient needs to be admitted or can be discharged with outpatient upper endoscopy as the symptoms are similar to previous episode that did not have bleeding gastric ulcer and was ultimately esophagitis.  Patient labs reassuring.  CT does show incidental finding of right adnexal mass will need outpatient ultrasound.  At this time patient not having any active hematemesis but given that this was similar to her previous episode which required hospitalization we will consult GI to see if they want this patient admitted or if patient to follow-up in the outpatient setting for upper endoscopy.  Consults: Lorenso Quarry, MD Terrilyn Saver, MD Hospitalist  Spoke to GI and they stated they would come down to see her but recommends admission due to reported hematemesis and slight drop in hemoglobin.  Hospitalist consulted.  Hospitalist consulted and accepted patient for admission.  Patient stable for admission.      Netta Corrigan, PA-C 11/11/22 1610    Gloris Manchester, MD 11/12/22 [phone]

## 2022-11-11 NOTE — ED Notes (Signed)
ED TO INPATIENT HANDOFF REPORT  ED Nurse Name and Phone #: Crist Infante, RN [phone]   S Name/Age/Gender [name] 47 y.o. female Room/Bed: WA15/WA15  Code Status   Code Status: Prior  Home/SNF/Other Home Patient oriented to: self, place, time, and situation Is this baseline? Yes   Triage Complete: Triage complete  Chief Complaint Abdominal pain  Triage Note Patient BIB EMS for evaluation of abdominal pain.  Pain chronic in nature.  Has hx of ulcers.  Reports she has been vomiting blood.  No vomiting noted by EMS.  CBG 119   Allergies Allergies  Allergen Reactions   Hydrocodone Nausea And Vomiting    Level of Care/Admitting Diagnosis ED Disposition     ED Disposition  Admit   Condition  --   Comment  The patient appears reasonably stabilized for admission considering the current resources, flow, and capabilities available in the ED at this time, and I doubt any other [facility] requiring further screening and/or treatment in the ED prior to admission is  present.          B Medical/Surgery History Past Medical History:  Diagnosis Date   Anemia 05/2015   due to blood loss from duodenal ulcer.  transfused 3 PRBCs.    Chronic abdominal pain 2008   ? IBS.  multiple CT scans: enlarged uterus.   Depression    with mood swings   Duodenal ulcer 05/2015   Esophagitis 2011   GERD (gastroesophageal reflux disease)    Hypertension    Obesity 2011   Polysubstance abuse (HCC) 09/28/2010   cocaine, opiates, THC, tobacco.      Past Surgical History:  Procedure Laterality Date   BIOPSY  09/08/2022   Procedure: BIOPSY;  Surgeon: Kerin Salen, MD;  Location: [facility];  Service: Gastroenterology;;   CHOLECYSTECTOMY  2004   ESOPHAGOGASTRODUODENOSCOPY N/A 06/04/2015   Procedure: ESOPHAGOGASTRODUODENOSCOPY (EGD);  Surgeon: Meryl Dare, MD;  Location: [facility];  Service: Endoscopy;  Laterality: N/A;   ESOPHAGOGASTRODUODENOSCOPY (EGD) WITH PROPOFOL N/A 09/08/2022    Procedure: ESOPHAGOGASTRODUODENOSCOPY (EGD) WITH PROPOFOL;  Surgeon: Kerin Salen, MD;  Location: [facility];  Service: Gastroenterology;  Laterality: N/A;   LAPAROSCOPIC APPENDECTOMY  11/2006   Dr Abbey Chatters   TONSILLECTOMY     TUBAL LIGATION       A IV Location/Drains/Wounds Patient Lines/Drains/Airways Status     Active Line/Drains/Airways     Name Placement date Placement time Site Days   Peripheral IV 11/11/22 18 G 1" Left Antecubital 11/11/22  --  Antecubital  less than 1            Intake/Output Last 24 hours  Intake/Output Summary (Last 24 hours) at 11/11/2022 4540 Last data filed at 11/11/2022 9811 Gross per 24 hour  Intake 50 ml  Output --  Net 50 ml    Labs/Imaging Results for orders placed or performed during the hospital encounter of 11/11/22 (from the past 48 hour(s))  POC occult blood, ED Provider will collect     Status: None   Collection Time: 11/11/22  5:59 AM  Result Value Ref Range   Fecal Occult Bld NEGATIVE NEGATIVE  Comprehensive metabolic panel     Status: Abnormal   Collection Time: 11/11/22  6:23 AM  Result Value Ref Range   Sodium 136 135 - 145 mmol/L   Potassium 3.3 (L) 3.5 - 5.1 mmol/L   Chloride 101 98 - 111 mmol/L   CO2 26 22 - 32 mmol/L   Glucose, Bld 83 70 -  99 mg/dL    Comment: Glucose reference range applies only to samples taken after fasting for at least 8 hours.   BUN 7 6 - 20 mg/dL   Creatinine, Ser 1.61 0.44 - 1.00 mg/dL   Calcium 9.2 8.9 - 09.6 mg/dL   Total Protein 7.7 6.5 - 8.1 g/dL   Albumin 2.9 (L) 3.5 - 5.0 g/dL   AST 16 15 - 41 U/L   ALT 9 0 - 44 U/L   Alkaline Phosphatase 56 38 - 126 U/L   Total Bilirubin 0.3 0.3 - 1.2 mg/dL   GFR, Estimated >04 >54 mL/min    Comment: (NOTE) Calculated using the CKD-EPI Creatinine Equation (2021)    Anion gap 9 5 - 15    Comment: Performed at [facility], [address] [place], [place] [address]  CBC with Differential     Status: Abnormal   Collection  Time: 11/11/22  6:23 AM  Result Value Ref Range   WBC 10.4 4.0 - 10.5 K/uL   RBC 3.39 (L) 3.87 - 5.11 MIL/uL   Hemoglobin 8.9 (L) 12.0 - 15.0 g/dL   HCT 91.4 (L) 78.2 - 95.6 %   MCV 83.2 80.0 - 100.0 fL   MCH 26.3 26.0 - 34.0 pg   MCHC 31.6 30.0 - 36.0 g/dL   RDW 21.3 08.6 - 57.8 %   Platelets 714 (H) 150 - 400 K/uL   nRBC 0.0 0.0 - 0.2 %   Neutrophils Relative % 79 %   Neutro Abs 8.3 (H) 1.7 - 7.7 K/uL   Lymphocytes Relative 12 %   Lymphs Abs 1.3 0.7 - 4.0 K/uL   Monocytes Relative 5 %   Monocytes Absolute 0.6 0.1 - 1.0 K/uL   Eosinophils Relative 1 %   Eosinophils Absolute 0.1 0.0 - 0.5 K/uL   Basophils Relative 1 %   Basophils Absolute 0.1 0.0 - 0.1 K/uL   Immature Granulocytes 2 %   Abs Immature Granulocytes 0.19 (H) 0.00 - 0.07 K/uL    Comment: Performed at [facility], [address], [place], [place] [address]  Troponin I (High Sensitivity)     Status: None   Collection Time: 11/11/22  6:23 AM  Result Value Ref Range   Troponin I (High Sensitivity) 6 <18 ng/L    Comment: (NOTE) Elevated high sensitivity troponin I (hsTnI) values and significant  changes across serial measurements may suggest ACS but many other  chronic and acute conditions are known to elevate hsTnI results.  Refer to the "Links" section for chest pain algorithms and additional  guidance. Performed at [facility], [address] [place], [place] [address]   Lipase, blood     Status: None   Collection Time: 11/11/22  6:23 AM  Result Value Ref Range   Lipase 29 11 - 51 U/L    Comment: Performed at [facility], [address], [place], [place] [address]  hCG, quantitative, pregnancy     Status: None   Collection Time: 11/11/22  6:23 AM  Result Value Ref Range   hCG, Beta Chain, Quant, S <1 <5 mIU/mL    Comment:          GEST. AGE      CONC.  (mIU/mL)   <=1 WEEK        5 - 50     2 WEEKS       50 - 500     3 WEEKS  100 - 10,000      4 WEEKS     1,000 - 30,000     5 WEEKS     3,500 - 115,000   6-8 WEEKS     12,000 - 270,000    12 WEEKS     15,000 - 220,000        FEMALE AND NON-PREGNANT FEMALE:     LESS THAN 5 mIU/mL Performed at [facility], [address], [place], [place] [address]    CT ABDOMEN PELVIS W CONTRAST  Result Date: 11/11/2022 CLINICAL DATA:  47 year old female with history of epigastric pain. EXAM: CT ABDOMEN AND PELVIS WITH CONTRAST TECHNIQUE: Multidetector CT imaging of the abdomen and pelvis was performed using the standard protocol following bolus administration of intravenous contrast. RADIATION DOSE REDUCTION: This exam was performed according to the departmental dose-optimization program which includes automated exposure control, adjustment of the mA and/or kV according to patient size and/or use of iterative reconstruction technique. CONTRAST:  OMNIPAQUE IOHEXOL 300 MG/ML  SOLN COMPARISON:  CTA of the chest, abdomen and pelvis 09/06/2022. FINDINGS: Comment: Portions of today's examination are limited by substantial patient respiratory motion. Lower chest: Unremarkable. Hepatobiliary: No suspicious cystic or solid hepatic lesions. Diffuse low attenuation throughout the hepatic parenchyma, indicative of a background of hepatic steatosis. No intra or extrahepatic biliary ductal dilatation. Status post cholecystectomy. Pancreas: No pancreatic mass. No pancreatic ductal dilatation. No peripancreatic fluid collections or inflammatory changes. Spleen: Unremarkable. Adrenals/Urinary Tract: Bilateral kidneys and adrenal glands are normal in appearance. No hydroureteronephrosis. Urinary bladder is unremarkable in appearance. 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 Stomach/Bowel: The appearance of the stomach is unremarkable. No  pathologic dilatation of small bowel or colon. The appendix is not confidently identified and may be surgically absent. Regardless, there are no inflammatory changes noted adjacent to the cecum to suggest the presence of an acute appendicitis at this time. Vascular/Lymphatic: Atherosclerosis in the abdominal aorta. No definite aneurysm or dissection noted in the abdominal or pelvic vasculature. Reproductive: Uterus is heterogeneous in appearance with multiple heterogeneously enhancing lesions, likely to represent small fibroids, largest of which measures approximately 2.1 cm in the uterine body (sagittal image 81 of series 10). In addition, there is a large heterogeneously enhancing lesion in the right adnexal region estimated to measure approximately 5.5 x 4.4 x 4.2 cm (axial image 52 of series 2 and coronal image 81 of series 8), concerning for potential ovarian neoplasm. Left ovary is unremarkable in appearance. Other: Small umbilical hernia containing a small amount of omental fat and some ascites. Trace volume of ascites. No pneumoperitoneum. Musculoskeletal: There are no aggressive appearing lytic or blastic lesions noted in the visualized portions of the skeleton. IMPRESSION: 1. Small umbilical hernia containing only omental fat and a trace volume of ascites. 2. Large heterogeneously enhancing right adnexal mass concerning for potential ovarian neoplasm. Further evaluation with pelvic ultrasound is strongly recommended in the near future to better evaluate this finding. Uterus is also heterogeneous in appearance, suggesting the presence of multiple fibroids. 3. Hepatic steatosis. 4. Aortic atherosclerosis. 5. Additional incidental findings, as above. Electronically Signed   By: Trudie Reed M.D.   On: 11/11/2022 08:06   DG Chest 2 View  Result Date: 11/11/2022 CLINICAL DATA:  47 year old female with history of chest pain. EXAM: CHEST - 2 VIEW COMPARISON:  Chest x-ray 09/06/2022. FINDINGS: Lung volumes  are normal. No consolidative airspace disease. No pleural effusions. No pneumothorax. No pulmonary nodule or mass noted. Pulmonary vasculature and the cardiomediastinal silhouette are within normal limits. IMPRESSION: No radiographic evidence of acute cardiopulmonary disease. Electronically Signed   By: Trudie Reed M.D.   On: 11/11/2022 07:54    Pending Labs Unresulted Labs (From admission, onward)    None       Vitals/Pain Today's Vitals   11/11/22 0058 11/11/22 0254 11/11/22 0651 11/11/22 0730  BP:  (!) 157/97 (!) 178/99   Pulse:  86 87   Resp:  18 17   Temp:  98.1 F (36.7 C) 99.5 F (37.5 C)   TempSrc:  Oral Oral   SpO2: 98% 100% 100%   Weight:      Height:      PainSc:    10-Worst pain ever    Isolation Precautions No active isolations  Medications Medications  pantoprazole (PROTONIX) injection 40 mg (40 mg Intravenous Given 11/11/22 0631)  famotidine (PEPCID) IVPB 20 mg premix (0 mg Intravenous Stopped 11/11/22 0729)  iohexol (OMNIPAQUE) 300 MG/ML solution 100 mL (100 mLs Intravenous Contrast Given 11/11/22 0737)    Mobility walks     Focused Assessments Neuro Assessment Handoff:  Swallow screen pass?  N/A         Neuro Assessment:   Neuro Checks:      Has TPA been given? No If patient is a Neuro Trauma and patient is going to OR before floor call report to 4N Charge nurse: [phone] or [phone]   R Recommendations: See Admitting Provider Note  Report given to:   Additional Notes:

## 2022-11-11 NOTE — H&P (View-Only) (Signed)
[facility] Gastroenterology Consult  Referring Provider: No ref. provider found Primary Care Physician:  Patient, No Pcp Per Primary Gastroenterologist: Unassigned  Reason for Consultation: Hematemesis  SUBJECTIVE:   HPI: [name] is a 47 y.o. female with past medical history significant for polysubstance use, history of peptic ulcer disease. Presented to hospital on 11/11/22 with 4-day history of hematemesis. She noted that she has been having nausea and hematemesis, she has been unable to eat a significant amount. No dysphagia. She has chronic RUQ pain. She has pain in her right upper extremity as well. She takes alternating Tylenol and Ibuprofen. She is not on PPI therapy. She is having brown bowel movements. She denied alcohol use. Noted using intra-nasal cocaine, last use was 4-5 days prior. No marijuana use.  She is known to [facility], she underwent EGD on 09/08/2022 (Dr. Marca Ancona) for coffee ground emesis and melena with findings of LA grade A esophagitis, 2 cm hiatal hernia, 10 mm pylorus non-bleeding and cratered ulceration, severe post-ulceration deformity in the duodenal bulb. Pathology showed duodenal mucosa with mild peptic change, negative for H.Pylori. GE junction biopsy showed unremarkable squamous mucosa negative for inflammation or eosinophilic esophagitis. She had duodenal ulcer in 2017 which required epinephrine administration and bipolar cautery.  Labs on presentation showed Hgb 8.9 (was 11.3 on 09/08/22), WBC 10.4, PLT 714, FOBT (-). CT scan of abdomen and pelvis completed 11/11/22 showed stomach within normal limit, no inflammatory changes, right sided adnexal lesion, small hiatal hernia. She is on IV PPI therapy. She noted that she has had 2 further episodes of hematemesis since arrival, ED provider denied further episodes of hematemesis.   Past Medical History:  Diagnosis Date   Anemia 05/2015   due to blood loss from duodenal ulcer.  transfused 3 PRBCs.    Chronic abdominal  pain 2008   ? IBS.  multiple CT scans: enlarged uterus.   Depression    with mood swings   Duodenal ulcer 05/2015   Esophagitis 2011   GERD (gastroesophageal reflux disease)    Hypertension    Obesity 2011   Polysubstance abuse (HCC) 09/28/2010   cocaine, opiates, THC, tobacco.      Past Surgical History:  Procedure Laterality Date   BIOPSY  09/08/2022   Procedure: BIOPSY;  Surgeon: Kerin Salen, MD;  Location: [facility];  Service: Gastroenterology;;   CHOLECYSTECTOMY  2004   ESOPHAGOGASTRODUODENOSCOPY N/A 06/04/2015   Procedure: ESOPHAGOGASTRODUODENOSCOPY (EGD);  Surgeon: Meryl Dare, MD;  Location: [facility];  Service: Endoscopy;  Laterality: N/A;   ESOPHAGOGASTRODUODENOSCOPY (EGD) WITH PROPOFOL N/A 09/08/2022   Procedure: ESOPHAGOGASTRODUODENOSCOPY (EGD) WITH PROPOFOL;  Surgeon: Kerin Salen, MD;  Location: [facility];  Service: Gastroenterology;  Laterality: N/A;   LAPAROSCOPIC APPENDECTOMY  11/2006   Dr Abbey Chatters   TONSILLECTOMY     TUBAL LIGATION     Prior to Admission medications   Medication Sig Start Date End Date Taking? Authorizing Provider  acetaminophen (TYLENOL) 500 MG tablet Take 500-1,000 mg by mouth every 6 (six) hours as needed for moderate pain.    [provider]  Multiple Vitamins-Minerals (MULTIVITAL PO) Take 1 tablet by mouth daily.    [provider]  pantoprazole (PROTONIX) 40 MG tablet Take 1 tablet (40 mg total) by mouth 2 (two) times daily before a meal. 09/08/22 11/07/22  Elease Etienne, MD   Current Facility-Administered Medications  Medication Dose Route Frequency Provider Last Rate Last Admin   0.9 %  sodium chloride infusion   Intravenous Continuous Kirby Crigler,  Mir M, MD 50 mL/hr at 11/11/22 1010 New Bag at 11/11/22 1010   acetaminophen (TYLENOL) tablet 650 mg  650 mg Oral Q6H PRN Kirby Crigler, Mir M, MD       Or   acetaminophen (TYLENOL) suppository 650 mg  650 mg Rectal Q6H PRN Kirby Crigler, Mir M, MD       albuterol  (PROVENTIL) (2.5 MG/3ML) 0.083% nebulizer solution 2.5 mg  2.5 mg Nebulization Q2H PRN Kirby Crigler, Mir M, MD       HYDROmorphone (DILAUDID) injection 0.5-1 mg  0.5-1 mg Intravenous Q2H PRN Kirby Crigler, Mir M, MD   1 mg at 11/11/22 1010   ondansetron (ZOFRAN) tablet 4 mg  4 mg Oral Q6H PRN Kirby Crigler, Mir M, MD       Or   ondansetron [facility]) injection 4 mg  4 mg Intravenous Q6H PRN Kirby Crigler, Mir M, MD       oxyCODONE (Oxy IR/ROXICODONE) immediate release tablet 5 mg  5 mg Oral Q4H PRN Kirby Crigler, Mir M, MD       pantoprazole (PROTONIX) 80 mg /NS 100 mL IVPB  80 mg Intravenous Once Kirby Crigler, Mir M, MD       Melene Muller ON 11/14/2022] pantoprazole (PROTONIX) injection 40 mg  40 mg Intravenous Q12H Kirby Crigler, Mir M, MD       pantoprozole (PROTONIX) 80 mg /NS 100 mL infusion  8 mg/hr Intravenous Continuous Kirby Crigler, Mir M, MD 10 mL/hr at 11/11/22 1012 8 mg/hr at 11/11/22 1012   traZODone (DESYREL) tablet 25 mg  25 mg Oral QHS PRN Maryln Gottron, MD       Current Outpatient Medications  Medication Sig Dispense Refill   acetaminophen (TYLENOL) 500 MG tablet Take 500-1,000 mg by mouth every 6 (six) hours as needed for moderate pain.     Multiple Vitamins-Minerals (MULTIVITAL PO) Take 1 tablet by mouth daily.     pantoprazole (PROTONIX) 40 MG tablet Take 1 tablet (40 mg total) by mouth 2 (two) times daily before a meal. 60 tablet 1   Allergies as of 11/11/2022 - Review Complete 11/11/2022  Allergen Reaction Noted   Hydrocodone Nausea And Vomiting 11/27/2014   Family History  Problem Relation Age of Onset   Hypertension Mother    Heart disease Father 37       MI   Stroke Father 62   Diabetes Father    GER disease Sister    Cancer Maternal Grandmother        uterine and stomach   Social History   Socioeconomic History   Marital status: Single    Spouse name: Not on file   Number of children: Not on file   Years of education: Not on file   Highest education level: Not on file   Occupational History   Not on file  Tobacco Use   Smoking status: Every Day    Current packs/day: 0.30    Types: Cigarettes   Smokeless tobacco: Never  Substance and Sexual Activity   Alcohol use: No    Comment: occ   Drug use: No    Types: Cocaine, Marijuana    Comment: abstinent for 1 year   Sexual activity: Yes    Birth control/protection: Surgical  Other Topics Concern   Not on file  Social History Narrative   ** Merged History Encounter **       Lives with a partner.  Never married.  6 children ages 90-20.   Finished 12th grade.  Unemployed.  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   Social Determinants of Health   Financial Resource Strain: Not on file  Food Insecurity: Not on file  Transportation Needs: Not on file  Physical Activity: Not on file  Stress: Not on file  Social Connections: Not on file  Intimate Partner Violence: Not on file   Review of Systems:  Review of Systems  Respiratory:  Positive for shortness of breath.   Cardiovascular:  Negative for chest pain.  Gastrointestinal:  Positive for abdominal pain, nausea and vomiting. Negative for blood in stool, constipation and diarrhea.    OBJECTIVE:   Temp:  [98.1 F (36.7 C)-99.5 F (37.5 C)] 99.5 F (37.5 C) (09/02 0651) Pulse Rate:  [80-87] 87 (09/02 0651) Resp:  [17-18] 17 (09/02 0651) BP: (157-198)/(97-105) 178/99 (09/02 0651) SpO2:  [96 %-100 %] 100 % (09/02 0651) Weight:  [47.6 kg] 47.6 kg (09/02 0046)   Physical Exam Constitutional:      General: She is not in acute distress.    Appearance: She is not ill-appearing, toxic-appearing or diaphoretic.  Cardiovascular:     Rate and Rhythm: Normal rate and regular rhythm.  Pulmonary:     Effort: No respiratory distress.     Breath sounds: Normal breath sounds.  Abdominal:     General: Bowel sounds are normal. There is no distension.     Palpations: Abdomen is soft.     Tenderness: There is abdominal tenderness. There is no  guarding.  Musculoskeletal:     Right lower leg: No edema.     Left lower leg: No edema.  Skin:    General: Skin is warm and dry.  Neurological:     Mental Status: She is alert.     Labs: Recent Labs    11/11/22 0623  WBC 10.4  HGB 8.9*  HCT 28.2*  PLT 714*   BMET Recent Labs    11/11/22 0623  NA 136  K 3.3*  CL 101  CO2 26  GLUCOSE 83  BUN 7  CREATININE 0.54  CALCIUM 9.2   LFT Recent Labs    11/11/22 0623  PROT 7.7  ALBUMIN 2.9*  AST 16  ALT 9  ALKPHOS 56  BILITOT 0.3   PT/INR No results for input(s): "LABPROT", "INR" in the last 72 hours.  Diagnostic imaging: CT ABDOMEN PELVIS W CONTRAST  Result Date: 11/11/2022 CLINICAL DATA:  47 year old female with history of epigastric pain. EXAM: CT ABDOMEN AND PELVIS WITH CONTRAST TECHNIQUE: Multidetector CT imaging of the abdomen and pelvis was performed using the standard protocol following bolus administration of intravenous contrast. RADIATION DOSE REDUCTION: This exam was performed according to the departmental dose-optimization program which includes automated exposure control, adjustment of the mA and/or kV according to patient size and/or use of iterative reconstruction technique. CONTRAST:  OMNIPAQUE IOHEXOL 300 MG/ML  SOLN COMPARISON:  CTA of the chest, abdomen and pelvis 09/06/2022. FINDINGS: Comment: Portions of today's examination are limited by substantial patient respiratory motion. Lower chest: Unremarkable. Hepatobiliary: No suspicious cystic or solid hepatic lesions. Diffuse low attenuation throughout the hepatic parenchyma, indicative of a background of hepatic steatosis. No intra or extrahepatic biliary ductal dilatation. Status post cholecystectomy. Pancreas: No pancreatic mass. No pancreatic ductal dilatation. No peripancreatic fluid collections or inflammatory changes. Spleen: Unremarkable. Adrenals/Urinary Tract: Bilateral kidneys and adrenal glands are normal in appearance. No  hydroureteronephrosis. Urinary bladder is unremarkable in appearance. However, there appears to be a large urethral diverticulum which extends from approximately 9:00 to 6:00 around  the anterior and right-side of the urethra best appreciated on axial image 67 of series 2. Stomach/Bowel: The appearance of the stomach is unremarkable. No pathologic dilatation of small bowel or colon. The appendix is not confidently identified and may be surgically absent. Regardless, there are no inflammatory changes noted adjacent to the cecum to suggest the presence of an acute appendicitis at this time. Vascular/Lymphatic: Atherosclerosis in the abdominal aorta. No definite aneurysm or dissection noted in the abdominal or pelvic vasculature. Reproductive: Uterus is heterogeneous in appearance with multiple heterogeneously enhancing lesions, likely to represent small fibroids, largest of which measures approximately 2.1 cm in the uterine body (sagittal image 81 of series 10). In addition, there is a large heterogeneously enhancing lesion in the right adnexal region estimated to measure approximately 5.5 x 4.4 x 4.2 cm (axial image 52 of series 2 and coronal image 81 of series 8), concerning for potential ovarian neoplasm. Left ovary is unremarkable in appearance. Other: Small umbilical hernia containing a small amount of omental fat and some ascites. Trace volume of ascites. No pneumoperitoneum. Musculoskeletal: There are no aggressive appearing lytic or blastic lesions noted in the visualized portions of the skeleton. IMPRESSION: 1. Small umbilical hernia containing only omental fat and a trace volume of ascites. 2. Large heterogeneously enhancing right adnexal mass concerning for potential ovarian neoplasm. Further evaluation with pelvic ultrasound is strongly recommended in the near future to better evaluate this finding. Uterus is also heterogeneous in appearance, suggesting the presence of multiple fibroids. 3. Hepatic  steatosis. 4. Aortic atherosclerosis. 5. Additional incidental findings, as above. Electronically Signed   By: Trudie Reed M.D.   On: 11/11/2022 08:06   DG Chest 2 View  Result Date: 11/11/2022 CLINICAL DATA:  47 year old female with history of chest pain. EXAM: CHEST - 2 VIEW COMPARISON:  Chest x-ray 09/06/2022. FINDINGS: Lung volumes are normal. No consolidative airspace disease. No pleural effusions. No pneumothorax. No pulmonary nodule or mass noted. Pulmonary vasculature and the cardiomediastinal silhouette are within normal limits. IMPRESSION: No radiographic evidence of acute cardiopulmonary disease. Electronically Signed   By: Trudie Reed M.D.   On: 11/11/2022 07:54    IMPRESSION: Hematemesis Normocytic anemia History gastric ulcer 08/2022 History duodenal ulcer 2017 NSAID use Cocaine use   PLAN: -Large bore IV x 2 -IV PPI Q12Hr -Trend H/H, transfuse for Hgb < 7  -Recommend EGD to investigate hematemesis further, discussed procedure in detail with patient including benefits, alternatives and risks of bleeding/infection/perforation/missed lesion/anesthesia, she verbalized understanding and elected to proceed -Patient with stable clinical appearance, not an emergent procedure on holiday, will schedule procedure for 11/12/22  -Ok for diet today, NPO at midnight for EGD 11/12/22 -[facility] will follow, Dr. Bosie Clos tomorrow    LOS: 0 days   Liliane Shi, DO [facility]

## 2022-11-11 NOTE — H&P (Signed)
History and Physical  [name] UJW:[id number] DOB: 03/02/1976 DOA: 11/11/2022  PCP: Patient, No Pcp Per   Chief Complaint: Abdominal pain, vomiting blood  HPI: [name] is a 47 y.o. female with medical history significant for polysubstance abuse, upper GI bleeding due to gastric ulcer and duodenal ulcer in the past who is being admitted to the hospital with acute blood loss anemia and concern for upper GI bleed.  Patient states that starting 4 days ago, she has had worsening epigastric abdominal pain, reflux symptoms, has had no appetite, nauseous, and intermittently vomiting nonbloody.  Has vomited up bright red bloody material.  Says that for the last couple weeks, her bowel movements have intermittently been dark.  She also has right lower quadrant abdominal pain, and states that she has lost 50 pounds in the last 3 to 4 weeks.  Denies any fevers or chills, or chest pain.  Denies any recent drug use, Goody powders or blood thinners.  ED Course: On evaluation in the emergency department, blood pressure is elevated but vitals are otherwise unremarkable.  Lab work was done, shows anemia hemoglobin 8.9 down from 11.3 on 09/08/2022.  Rectal exam in the emergency department was unremarkable, negative for any fecal occult blood.  She had CT of the abdomen pelvis as noted below.  Review of Systems: Please see HPI for pertinent positives and negatives. A complete 10 system review of systems are otherwise negative.  Past Medical History:  Diagnosis Date   Anemia 05/2015   due to blood loss from duodenal ulcer.  transfused 3 PRBCs.    Chronic abdominal pain 2008   ? IBS.  multiple CT scans: enlarged uterus.   Depression    with mood swings   Duodenal ulcer 05/2015   Esophagitis 2011   GERD (gastroesophageal reflux disease)    Hypertension    Obesity 2011   Polysubstance abuse (HCC) 09/28/2010   cocaine, opiates, THC, tobacco.      Past Surgical History:  Procedure Laterality Date    BIOPSY  09/08/2022   Procedure: BIOPSY;  Surgeon: Kerin Salen, MD;  Location: [facility];  Service: Gastroenterology;;   CHOLECYSTECTOMY  2004   ESOPHAGOGASTRODUODENOSCOPY N/A 06/04/2015   Procedure: ESOPHAGOGASTRODUODENOSCOPY (EGD);  Surgeon: Meryl Dare, MD;  Location: [facility];  Service: Endoscopy;  Laterality: N/A;   ESOPHAGOGASTRODUODENOSCOPY (EGD) WITH PROPOFOL N/A 09/08/2022   Procedure: ESOPHAGOGASTRODUODENOSCOPY (EGD) WITH PROPOFOL;  Surgeon: Kerin Salen, MD;  Location: [facility];  Service: Gastroenterology;  Laterality: N/A;   LAPAROSCOPIC APPENDECTOMY  11/2006   Dr Abbey Chatters   TONSILLECTOMY     TUBAL LIGATION      Social History:  reports that she has been smoking cigarettes. She has never used smokeless tobacco. She reports that she does not drink alcohol and does not use drugs.   Allergies  Allergen Reactions   Hydrocodone Nausea And Vomiting    Family History  Problem Relation Age of Onset   Hypertension Mother    Heart disease Father 66       MI   Stroke Father 14   Diabetes Father    GER disease Sister    Cancer Maternal Grandmother        uterine and stomach     Prior to Admission medications   Medication Sig Start Date End Date Taking? Authorizing Provider  acetaminophen (TYLENOL) 500 MG tablet Take 500-1,000 mg by mouth every 6 (six) hours as needed for moderate pain.    [provider]  Multiple  Vitamins-Minerals (MULTIVITAL PO) Take 1 tablet by mouth daily.    [provider]  pantoprazole (PROTONIX) 40 MG tablet Take 1 tablet (40 mg total) by mouth 2 (two) times daily before a meal. 09/08/22 11/07/22  Hongalgi, Maximino Greenland, MD    Physical Exam: BP (!) 178/99   Pulse 87   Temp 99.5 F (37.5 C) (Oral)   Resp 17   Ht 5\' 1"  (1.549 m)   Wt 47.6 kg   LMP 11/06/2022 (Approximate) Comment: patient states no chance of pregnancy, pregnancy test waiver signed=11/11/22  SpO2 100%   BMI 19.84 kg/m   General: Thin female in no acute  distress, looks her stated age.  Looks somewhat emaciated and dehydrated Eyes: EOMI, clear conjuctivae, white sclerea Neck: supple, no masses, trachea mildline  Cardiovascular: RRR, no murmurs or rubs, no peripheral edema  Respiratory: clear to auscultation bilaterally, no wheezes, no crackles  Abdomen: Soft, slightly distended, diffusely tender but more so on the right side, she has a soft periumbilical hernia Skin: dry, no rashes  Musculoskeletal: no joint effusions, normal range of motion  Psychiatric: appropriate affect, normal speech  Neurologic: extraocular muscles intact, clear speech, moving all extremities with intact sensorium         Labs on Admission:  Basic Metabolic Panel: Recent Labs  Lab 11/11/22 0623  NA 136  K 3.3*  CL 101  CO2 26  GLUCOSE 83  BUN 7  CREATININE 0.54  CALCIUM 9.2   Liver Function Tests: Recent Labs  Lab 11/11/22 0623  AST 16  ALT 9  ALKPHOS 56  BILITOT 0.3  PROT 7.7  ALBUMIN 2.9*   Recent Labs  Lab 11/11/22 0623  LIPASE 29   No results for input(s): "AMMONIA" in the last 168 hours. CBC: Recent Labs  Lab 11/11/22 0623  WBC 10.4  NEUTROABS 8.3*  HGB 8.9*  HCT 28.2*  MCV 83.2  PLT 714*   Cardiac Enzymes: No results for input(s): "CKTOTAL", "CKMB", "CKMBINDEX", "TROPONINI" in the last 168 hours.  BNP (last 3 results) No results for input(s): "BNP" in the last 8760 hours.  ProBNP (last 3 results) No results for input(s): "PROBNP" in the last 8760 hours.  CBG: No results for input(s): "GLUCAP" in the last 168 hours.  Radiological Exams on Admission: CT ABDOMEN PELVIS W CONTRAST  Result Date: 11/11/2022 CLINICAL DATA:  47 year old female with history of epigastric pain. EXAM: CT ABDOMEN AND PELVIS WITH CONTRAST TECHNIQUE: Multidetector CT imaging of the abdomen and pelvis was performed using the standard protocol following bolus administration of intravenous contrast. RADIATION DOSE REDUCTION: This exam was performed  according to the departmental dose-optimization program which includes automated exposure control, adjustment of the mA and/or kV according to patient size and/or use of iterative reconstruction technique. CONTRAST:  OMNIPAQUE IOHEXOL 300 MG/ML  SOLN COMPARISON:  CTA of the chest, abdomen and pelvis 09/06/2022. FINDINGS: Comment: Portions of today's examination are limited by substantial patient respiratory motion. Lower chest: Unremarkable. Hepatobiliary: No suspicious cystic or solid hepatic lesions. Diffuse low attenuation throughout the hepatic parenchyma, indicative of a background of hepatic steatosis. No intra or extrahepatic biliary ductal dilatation. Status post cholecystectomy. Pancreas: No pancreatic mass. No pancreatic ductal dilatation. No peripancreatic fluid collections or inflammatory changes. Spleen: Unremarkable. Adrenals/Urinary Tract: Bilateral kidneys and adrenal glands are normal in appearance. No hydroureteronephrosis. Urinary bladder is unremarkable in appearance. However, there appears to be a large urethral diverticulum which extends from approximately 9:00 to 6:00 around the anterior and right-side of  the urethra best appreciated on axial image 67 of series 2. Stomach/Bowel: The appearance of the stomach is unremarkable. No pathologic dilatation of small bowel or colon. The appendix is not confidently identified and may be surgically absent. Regardless, there are no inflammatory changes noted adjacent to the cecum to suggest the presence of an acute appendicitis at this time. Vascular/Lymphatic: Atherosclerosis in the abdominal aorta. No definite aneurysm or dissection noted in the abdominal or pelvic vasculature. Reproductive: Uterus is heterogeneous in appearance with multiple heterogeneously enhancing lesions, likely to represent small fibroids, largest of which measures approximately 2.1 cm in the uterine body (sagittal image 81 of series 10). In addition, there is a large  heterogeneously enhancing lesion in the right adnexal region estimated to measure approximately 5.5 x 4.4 x 4.2 cm (axial image 52 of series 2 and coronal image 81 of series 8), concerning for potential ovarian neoplasm. Left ovary is unremarkable in appearance. Other: Small umbilical hernia containing a small amount of omental fat and some ascites. Trace volume of ascites. No pneumoperitoneum. Musculoskeletal: There are no aggressive appearing lytic or blastic lesions noted in the visualized portions of the skeleton. IMPRESSION: 1. Small umbilical hernia containing only omental fat and a trace volume of ascites. 2. Large heterogeneously enhancing right adnexal mass concerning for potential ovarian neoplasm. Further evaluation with pelvic ultrasound is strongly recommended in the near future to better evaluate this finding. Uterus is also heterogeneous in appearance, suggesting the presence of multiple fibroids. 3. Hepatic steatosis. 4. Aortic atherosclerosis. 5. Additional incidental findings, as above. Electronically Signed   By: Trudie Reed M.D.   On: 11/11/2022 08:06   DG Chest 2 View  Result Date: 11/11/2022 CLINICAL DATA:  47 year old female with history of chest pain. EXAM: CHEST - 2 VIEW COMPARISON:  Chest x-ray 09/06/2022. FINDINGS: Lung volumes are normal. No consolidative airspace disease. No pleural effusions. No pneumothorax. No pulmonary nodule or mass noted. Pulmonary vasculature and the cardiomediastinal silhouette are within normal limits. IMPRESSION: No radiographic evidence of acute cardiopulmonary disease. Electronically Signed   By: Trudie Reed M.D.   On: 11/11/2022 07:54    Assessment/Plan [name] is a 47 y.o. female with medical history significant for polysubstance abuse, upper GI bleeding due to gastric ulcer and duodenal ulcer in the past who is being admitted to the hospital with acute blood loss anemia and concern for upper GI bleed.   Upper GI bleed-suspected  due to hematemesis, history of gastric and peptic ulcer disease.  She is hemodynamically stable, has only had a slight drop in her hemoglobin. -Observation admission -Avoid blood thinners -IV Protonix bolus and drip -Trend hemoglobin every 8 hours -GI consult -Keep n.p.o. -Pain and nausea control as needed  Acute on chronic blood loss anemia-due to suspected upper GI bleed, treating as above  Hypokalemia-minimal and inconsequential, repleted orally  Large right adnexal mass-in the setting of significant weight loss, this is concerning for malignancy.  This was discussed with the patient. -Complete pelvic ultrasound ordered  History of polysubstance abuse-check urine drug screen  DVT prophylaxis: SCDs only    Code Status: Full Code  Consults called: EDP discussed with Dr. Lorenso Quarry of GI  Admission status: Observation  Time spent: 53 minutes  Ashlynn Gunnels Sharlette Dense MD [facility] Pager [phone]  If 7PM-7AM, please contact night-coverage [email or web address] Password [facility]  11/11/2022, 9:49 AM

## 2022-11-11 NOTE — ED Notes (Signed)
A UA sample has been collected and sent down to lab, if needed.

## 2022-11-11 NOTE — Plan of Care (Signed)
  Problem: Education: Goal: Knowledge of General Education information will improve Description Including pain rating scale, medication(s)/side effects and non-pharmacologic comfort measures Outcome: Progressing   Problem: Health Behavior/Discharge Planning: Goal: Ability to manage health-related needs will improve Outcome: Progressing   

## 2022-11-11 NOTE — ED Triage Notes (Addendum)
Patient BIB EMS for evaluation of abdominal pain.  Pain chronic in nature.  Has hx of ulcers.  Reports she has been vomiting blood.  No vomiting noted by EMS.  CBG 119

## 2022-11-11 NOTE — ED Provider Notes (Signed)
[place] EMERGENCY DEPARTMENT AT [facility] Provider Note   CSN: [id number] Arrival date & time: 11/11/22  0037     History  Chief Complaint  Patient presents with   Abdominal Pain    [name] is a 47 y.o. female.  HPI   Patient with medical history including polysubstance use, GERD, hypertension, gastric ulcers, presenting with complaints of epigastric pain.  He states that she is having this pain for last 2 months, but over the last 2 days got worse, states it is in her epigastric/right upper quadrant, pain is constant, associated nausea vomiting, she endorses throwing up bloody clots, she denies any bloody stools or dark tarry stools, she has no associate fever chills cough congestion.  She denies any alcohol use or NSAID use, she does admit to using cocaine a few days ago.  Reviewed patient's chart had similar presentation a few months ago, was admitted, had EGD performed, which shows esophagitis, nonbleeding gastric ulcer, patient started on Protonix and discharged home.  Home Medications Prior to Admission medications   Medication Sig Start Date End Date Taking? Authorizing Provider  acetaminophen (TYLENOL) 500 MG tablet Take 500-1,000 mg by mouth every 6 (six) hours as needed for moderate pain.    [provider]  Multiple Vitamins-Minerals (MULTIVITAL PO) Take 1 tablet by mouth daily.    [provider]  pantoprazole (PROTONIX) 40 MG tablet Take 1 tablet (40 mg total) by mouth 2 (two) times daily before a meal. 09/08/22 11/07/22  Hongalgi, Maximino Greenland, MD      Allergies    Hydrocodone    Review of Systems   Review of Systems  Constitutional:  Negative for chills and fever.  Respiratory:  Negative for shortness of breath.   Cardiovascular:  Negative for chest pain.  Gastrointestinal:  Positive for abdominal pain, nausea and vomiting.  Neurological:  Negative for headaches.    Physical Exam Updated Vital Signs BP (!) 178/99   Pulse  87   Temp 99.5 F (37.5 C) (Oral)   Resp 17   Ht 5\' 1"  (1.549 m)   Wt 47.6 kg   SpO2 100%   BMI 19.84 kg/m  Physical Exam Vitals and nursing note reviewed.  Constitutional:      General: She is not in acute distress.    Appearance: She is not ill-appearing.  HENT:     Head: Normocephalic and atraumatic.     Nose: No congestion.  Eyes:     Conjunctiva/sclera: Conjunctivae normal.  Cardiovascular:     Rate and Rhythm: Normal rate and regular rhythm.     Pulses: Normal pulses.     Heart sounds: No murmur heard.    No friction rub. No gallop.  Pulmonary:     Effort: No respiratory distress.     Breath sounds: No wheezing, rhonchi or rales.  Abdominal:     Palpations: Abdomen is soft.     Tenderness: There is abdominal tenderness. There is no right CVA tenderness or left CVA tenderness.     Comments: Abdomen nondistended, soft, she has a noted ventral hernia, patient has noted epigastric tenderness without guarding weakness or peritoneal sign.  Genitourinary:    Comments: Chaperone present rectal exam performed no noted rectal discharge or bleeding, digital rectal exam was performed patient has good rectal tone, no frank melena or hematochezia, Hemoccult is negative. Musculoskeletal:     Right lower leg: No edema.     Left lower leg: No edema.  Skin:  General: Skin is warm and dry.  Neurological:     Mental Status: She is alert.  Psychiatric:        Mood and Affect: Mood normal.     ED Results / Procedures / Treatments   Labs (all labs ordered are listed, but only abnormal results are displayed) Labs Reviewed  COMPREHENSIVE METABOLIC PANEL - Abnormal; Notable for the following components:      Result Value   Potassium 3.3 (*)    Albumin 2.9 (*)    All other components within normal limits  CBC WITH DIFFERENTIAL/PLATELET - Abnormal; Notable for the following components:   RBC 3.39 (*)    Hemoglobin 8.9 (*)    HCT 28.2 (*)    Platelets 714 (*)    Neutro Abs 8.3  (*)    Abs Immature Granulocytes 0.19 (*)    All other components within normal limits  LIPASE, BLOOD  HCG, QUANTITATIVE, PREGNANCY  POC OCCULT BLOOD, ED  TROPONIN I (HIGH SENSITIVITY)    EKG None  Radiology No results found.  Procedures Procedures    Medications Ordered in ED Medications  iohexol (OMNIPAQUE) 300 MG/ML solution 100 mL (has no administration in time range)  pantoprazole (PROTONIX) injection 40 mg (40 mg Intravenous Given 11/11/22 0631)  famotidine (PEPCID) IVPB 20 mg premix (20 mg Intravenous New Bag/Given 11/11/22 9528)    ED Course/ Medical Decision Making/ A&P                                 Medical Decision Making Amount and/or Complexity of Data Reviewed Labs: ordered. Radiology: ordered.  Risk Prescription drug management.   This patient presents to the ED for concern of epigastric pain, this involves an extensive number of treatment options, and is a complaint that carries with it a high risk of complications and morbidity.  The differential diagnosis includes upper GI bleed, peptic ulcer disease, pancreatitis, cholecystitis, ACS    Additional history obtained:  Additional history obtained from N/A External records from outside source obtained and reviewed including ER notes   Co morbidities that complicate the patient evaluation  Peptic ulcer disease  Social Determinants of Health:  Polysubstance use    Lab Tests:  I Ordered, and personally interpreted labs.  The pertinent results include: CBC shows normocytic anemia hemoglobin 8.9, CMP reveals potassium 3.3, albumin 2.9, first troponin 6, lipase 29, Hemoccult is negative   Imaging Studies ordered:  I ordered imaging studies including chest x-ray, CT and pelvis I independently visualized and interpreted imaging which showed both pending at this time I agree with the radiologist interpretation   Cardiac Monitoring:  The patient was maintained on a cardiac monitor.  I  personally viewed and interpreted the cardiac monitored which showed an underlying rhythm of: Pending   Medicines ordered and prescription drug management:  I ordered medication including Pepcid, Protonix I have reviewed the patients home medicines and have made adjustments as needed  Critical Interventions:  N/A   Reevaluation:  Presents with abdominal pain, will obtain screening lab workup, provide with Protonix, Pepcid, continue to monitor.    Consultations Obtained:  N/A    Test Considered:  N/A  Dispostion and problem list  Due to shift change patient will be handed off to [facility]   Follow-up on CT abdomen pelvis, second troponin, consult with GI for further recommendations.            Final Clinical  Impression(s) / ED Diagnoses Final diagnoses:  Epigastric pain    Rx / DC Orders ED Discharge Orders     None         Carroll Sage, PA-C 11/11/22 5284    Gloris Manchester, MD 11/12/22 [phone]

## 2022-11-11 NOTE — Plan of Care (Signed)

## 2022-11-12 ENCOUNTER — Inpatient Hospital Stay (HOSPITAL_COMMUNITY): Payer: Medicaid Other

## 2022-11-12 ENCOUNTER — Encounter (HOSPITAL_COMMUNITY): Payer: Self-pay | Admitting: Internal Medicine

## 2022-11-12 ENCOUNTER — Encounter (HOSPITAL_COMMUNITY)
Admission: EM | Disposition: A | Discharge status: Home / Self Care | Payer: Self-pay | Source: Home / Self Care | Attending: Internal Medicine

## 2022-11-12 DIAGNOSIS — K222 Esophageal obstruction: Secondary | ICD-10-CM

## 2022-11-12 DIAGNOSIS — F17210 Nicotine dependence, cigarettes, uncomplicated: Secondary | ICD-10-CM

## 2022-11-12 DIAGNOSIS — K259 Gastric ulcer, unspecified as acute or chronic, without hemorrhage or perforation: Secondary | ICD-10-CM

## 2022-11-12 DIAGNOSIS — I10 Essential (primary) hypertension: Secondary | ICD-10-CM

## 2022-11-12 HISTORY — PX: BIOPSY: SHX5522

## 2022-11-12 HISTORY — PX: ESOPHAGOGASTRODUODENOSCOPY: SHX5428

## 2022-11-12 LAB — CBC WITH DIFFERENTIAL/PLATELET
Abs Immature Granulocytes: 0.05 10*3/uL (ref 0.00–0.07)
Basophils Absolute: 0 10*3/uL (ref 0.0–0.1)
Basophils Relative: 0 %
Eosinophils Absolute: 0.1 10*3/uL (ref 0.0–0.5)
Eosinophils Relative: 1 %
HCT: 28 % — ABNORMAL LOW (ref 36.0–46.0)
Hemoglobin: 8.7 g/dL — ABNORMAL LOW (ref 12.0–15.0)
Immature Granulocytes: 0 %
Lymphocytes Relative: 11 %
Lymphs Abs: 1.3 10*3/uL (ref 0.7–4.0)
MCH: 26.9 pg (ref 26.0–34.0)
MCHC: 31.1 g/dL (ref 30.0–36.0)
MCV: 86.4 fL (ref 80.0–100.0)
Monocytes Absolute: 0.7 10*3/uL (ref 0.1–1.0)
Monocytes Relative: 6 %
Neutro Abs: 10 10*3/uL — ABNORMAL HIGH (ref 1.7–7.7)
Neutrophils Relative %: 82 %
Platelets: 710 10*3/uL — ABNORMAL HIGH (ref 150–400)
RBC: 3.24 MIL/uL — ABNORMAL LOW (ref 3.87–5.11)
RDW: 15.6 % — ABNORMAL HIGH (ref 11.5–15.5)
WBC: 12.1 10*3/uL — ABNORMAL HIGH (ref 4.0–10.5)
nRBC: 0 % (ref 0.0–0.2)

## 2022-11-12 LAB — TYPE AND SCREEN
ABO/RH(D): O NEG
Antibody Screen: NEGATIVE

## 2022-11-12 LAB — HEMOGLOBIN AND HEMATOCRIT, BLOOD
HCT: 24.7 % — ABNORMAL LOW (ref 36.0–46.0)
Hemoglobin: 7.6 g/dL — ABNORMAL LOW (ref 12.0–15.0)

## 2022-11-12 LAB — BASIC METABOLIC PANEL
Anion gap: 6 (ref 5–15)
BUN: 12 mg/dL (ref 6–20)
CO2: 24 mmol/L (ref 22–32)
Calcium: 8.2 mg/dL — ABNORMAL LOW (ref 8.9–10.3)
Chloride: 107 mmol/L (ref 98–111)
Creatinine, Ser: 0.75 mg/dL (ref 0.44–1.00)
GFR, Estimated: >60 mL/min (ref >60)
Glucose, Bld: 103 mg/dL — ABNORMAL HIGH (ref 70–99)
Potassium: 3.6 mmol/L (ref 3.5–5.1)
Sodium: 137 mmol/L (ref 135–145)

## 2022-11-12 SURGERY — EGD (ESOPHAGOGASTRODUODENOSCOPY)
Anesthesia: Monitor Anesthesia Care

## 2022-11-12 MED ORDER — PHENYLEPHRINE 80 MCG/ML (10ML) SYRINGE FOR IV PUSH (FOR BLOOD PRESSURE SUPPORT)
PREFILLED_SYRINGE | INTRAVENOUS | Status: DC | PRN
Start: 2022-11-12 — End: 2022-11-12
  Administered 2022-11-12 (×2): 160 ug via INTRAVENOUS
  Administered 2022-11-12: 80 ug via INTRAVENOUS

## 2022-11-12 MED ORDER — ETOMIDATE 2 MG/ML IV SOLN
INTRAVENOUS | Status: DC | PRN
Start: 2022-11-12 — End: 2022-11-12
  Administered 2022-11-12: 5 mg via INTRAVENOUS
  Administered 2022-11-12: 2.5 mg via INTRAVENOUS
  Administered 2022-11-12: 2 mg via INTRAVENOUS

## 2022-11-12 MED ORDER — PROPOFOL 500 MG/50ML IV EMUL
INTRAVENOUS | Status: DC | PRN
  Administered 2022-11-12: 100 ug/kg/min via INTRAVENOUS

## 2022-11-12 MED ORDER — PROPOFOL 10 MG/ML IV BOLUS
INTRAVENOUS | Status: DC | PRN
Start: 2022-11-12 — End: 2022-11-12
  Administered 2022-11-12: 10 mg via INTRAVENOUS
  Administered 2022-11-12: 25 mg via INTRAVENOUS
  Administered 2022-11-12: 50 mg via INTRAVENOUS

## 2022-11-12 MED ORDER — PROPOFOL 500 MG/50ML IV EMUL
INTRAVENOUS | Status: AC
  Filled 2022-11-12: qty 50

## 2022-11-12 MED ORDER — LACTATED RINGERS IV SOLN: INTRAVENOUS | Status: DC

## 2022-11-12 NOTE — Anesthesia Postprocedure Evaluation (Signed)
Anesthesia Post Note  Patient: [name]  Procedure(s) Performed: ESOPHAGOGASTRODUODENOSCOPY (EGD) BIOPSY     Patient location during evaluation: PACU Anesthesia Type: MAC Level of consciousness: awake and alert Pain management: pain level controlled Vital Signs Assessment: post-procedure vital signs reviewed and stable Respiratory status: spontaneous breathing, nonlabored ventilation and respiratory function stable Cardiovascular status: blood pressure returned to baseline and stable Postop Assessment: no apparent nausea or vomiting Anesthetic complications: no   No notable events documented.  Last Vitals:  Vitals:   11/12/22 0925 11/12/22 0948  BP:  139/88  Pulse: 81 73  Resp: 20 18  Temp:  36.8 C  SpO2: 100% 100%    Last Pain:  Vitals:   11/12/22 0959  TempSrc:   PainSc: 0-No pain                 Lowella Curb

## 2022-11-12 NOTE — Progress Notes (Signed)
Progress Note   Patient: [name] NFA:[id number] DOB: 1975/03/26 DOA: 11/11/2022     1 DOS: the patient was seen and examined on 11/12/2022   Brief hospital course: 47yo female with h/o polysubstance abuse and prior UGI bleeding from gastric/duodenal ulcer who presented on 9/2 with abdominal pain and hematemesis.  Hgb stable.  She was incidentally noted on CT to have a large R adnexal mass and pelvic US was ordered and showed probable exophytic uterine fibroid, ok for outpatient GYN f/u.  EGD on 9/3 with grade A esophagitis, 2 non-bleeding cratered gastric ulcers, acute gastritis, and significant food residue in the cardia, gastric fundus, and gastric body.  Assessment and Plan:  Upper GI Bleed Patient is presenting with abdominal pain and hematemesis, suggestive of upper GI bleeding. She underwent EGD on 9/3 which showed grade A esophagitis, 2 non-bleeding cratered gastric ulcers, acute gastritis, and significant food residue in the cardia, gastric fundus, and gastric body Admitted to progressive bed  GI is consulting Needs IV pantoprazole bolus and infusion x 72 hours and then transition to IV/PO PPI Zofran IV for nausea Avoid NSAIDs and SQ heparin Clear -> full liquid diet  ABLA Her Hgb decreased from 11.3 on 6/30 to 8.9 -> 8.7 today.  Type and screen ordered Monitor closely and follow cbc daily, transfuse as necessary for Hbg <7  Adnexal mass Pelvic US with probable exophytic uterine fibroid Needs outpatient GYN f/u   Polysubstance abuse Cessation encouraged; this should be encouraged on an ongoing basis UDS positive for opiates, cocaine, THC She denies having an issue with drugs     Consultants: GI  Procedures: EGD  Antibiotics: None  30 Day Unplanned Readmission Risk Score    Flowsheet Row ED to Hosp-Admission (Current) from 11/11/2022 in [facility]  30 Day Unplanned Readmission Risk Score (%) 16.94 Filed at 11/12/2022 0801        This score is the patient's risk of an unplanned readmission within 30 days of being discharged (0 -100%). The score is based on dignosis, age, lab data, medications, orders, and past utilization.   Low:  0-14.9   Medium: 15-21.9   High: 22-29.9   Extreme: 30 and above           Subjective: She reports that she has used cocaine to deal with abdominal pain and denies having a problem with drugs.  She had EGD this AM and she wants to eat.  She says she is willing to stay for 72 hours of Protonix drip.   Objective: Vitals:   11/12/22 0948 11/12/22 1232  BP: 139/88 (!) 131/94  Pulse: 73 73  Resp: 18 18  Temp: 98.2 F (36.8 C) 98.2 F (36.8 C)  SpO2: 100% 100%    Intake/Output Summary (Last 24 hours) at 11/12/2022 1523 Last data filed at 11/12/2022 1100 Gross per 24 hour  Intake 2740.59 ml  Output --  Net 2740.59 ml   Filed Weights   11/11/22 0046 11/12/22 0744  Weight: 47.6 kg 47.6 kg    Exam:  General:  Appears calm and comfortable and is in NAD Eyes:  EOMI, normal lids, iris ENT:  grossly normal hearing, lips & tongue, mmm Neck:  no LAD, masses or thyromegaly Cardiovascular:  RRR, no m/r/g. No LE edema.  Respiratory:   CTA bilaterally with no wheezes/rales/rhonchi.  Normal respiratory effort. Abdomen:  soft, NT, ND Skin:  no rash or induration seen on limited exam Musculoskeletal:  grossly normal tone BUE/BLE, good  ROM, no bony abnormality Psychiatric:  blunted mood and affect, speech fluent and appropriate, AOx3 Neurologic:  CN 2-12 grossly intact, moves all extremities in coordinated fashion   Data Reviewed: I have reviewed the patient's lab results since admission.  Pertinent labs for today include:   Normal BMP CBC pending UDS + opiates, cocaine, THC     Family Communication: None present  Disposition: Status is: Inpatient Remains inpatient appropriate because: ongoing IV PPI     Time spent: 50 minutes   Author: Jonah Blue, MD 11/12/2022  3:23 PM  For on call review [email or web address].

## 2022-11-12 NOTE — Transfer of Care (Signed)
Immediate Anesthesia Transfer of Care Note  Patient: [name]  Procedure(s) Performed: ESOPHAGOGASTRODUODENOSCOPY (EGD) BIOPSY  Patient Location: PACU and Endoscopy Unit  Anesthesia Type:MAC  Level of Consciousness: drowsy  Airway & Oxygen Therapy: Patient Spontanous Breathing and Patient connected to face mask oxygen  Post-op Assessment: Report given to RN and Post -op Vital signs reviewed and stable  Post vital signs: Reviewed and stable  Last Vitals:  Vitals Value Taken Time  BP    Temp    Pulse 85 11/12/22 0904  Resp 16 11/12/22 0904  SpO2 96 % 11/12/22 0904  Vitals shown include unfiled device data.  Last Pain:  Vitals:   11/12/22 0744  TempSrc: Temporal  PainSc: 9       Patients Stated Pain Goal: 0 (11/12/22 0744)  Complications: No notable events documented.

## 2022-11-12 NOTE — Anesthesia Procedure Notes (Addendum)
Procedure Name: MAC Date/Time: 11/12/2022 8:35 AM  Performed by: Maurene Capes, CRNAPre-anesthesia Checklist: Patient identified, Emergency Drugs available, Suction available and Patient being monitored Patient Re-evaluated:Patient Re-evaluated prior to induction Oxygen Delivery Method: Simple face mask Induction Type: IV induction Placement Confirmation: positive ETCO2 Dental Injury: Teeth and Oropharynx as per pre-operative assessment

## 2022-11-12 NOTE — Op Note (Signed)
[facility] Patient Name: [name] Procedure Date: 11/12/2022 MRN: [id number] Attending MD: [name] , MD, [id number] Date of Birth: [birth year] CSN: [id number] Age: 47 Admit Type: Outpatient Procedure:                Upper GI endoscopy Indications:              Hematemesis Providers:                [name], MD, Marge Duncans, RN,                            Jacquelyn "Jaci" Clelia Croft, RN, Priscella Mann, Technician Referring MD:             hospital team Medicines:                Propofol per Anesthesia, Monitored Anesthesia Care Complications:            No immediate complications. Estimated Blood Loss:     Estimated blood loss was minimal. Procedure:                Pre-Anesthesia Assessment:                           - Prior to the procedure, a History and Physical                            was performed, and patient medications and                            allergies were reviewed. The patient's tolerance of                            previous anesthesia was also reviewed. The risks                            and benefits of the procedure and the sedation                            options and risks were discussed with the patient.                            All questions were answered, and informed consent                            was obtained. Prior Anticoagulants: The patient has                            taken no anticoagulant or antiplatelet agents. ASA                            Grade Assessment: III - A patient with severe                            systemic disease. After reviewing the risks and  benefits, the patient was deemed in satisfactory                            condition to undergo the procedure.                           After obtaining informed consent, the endoscope was                            passed under direct vision. Throughout the                            procedure, the patient's blood  pressure, pulse, and                            oxygen saturations were monitored continuously. The                            [id number]-H190 ([id number]) Olympus endoscope was introduced                            through the mouth, and advanced to the second part                            of duodenum. The upper GI endoscopy was                            accomplished without difficulty. The patient                            tolerated the procedure well. Scope In: Scope Out: Findings:      LA Grade A (one or more mucosal breaks less than 5 mm, not extending       between tops of 2 mucosal folds) esophagitis with no bleeding was found       in the distal esophagus.      A low-grade of narrowing Schatzki ring was found at the gastroesophageal       junction.      The Z-line was found 36 cm from the incisors.      A large amount of food (residue) was found in the cardia, in the gastric       fundus and in the gastric body.      Two non-bleeding cratered gastric ulcers with no stigmata of bleeding       were found in the gastric antrum and in the prepyloric region of the       stomach. The largest lesion was 20 mm in largest dimension.      Segmental moderate inflammation characterized by congestion (edema) and       erythema was found in the gastric antrum. Biopsies were taken with a       cold forceps for histology. Estimated blood loss was minimal.      The examined duodenum was normal.      A shortened bulb deformity was found in the duodenal bulb.      A medium-sized hiatal hernia was present.      Limited views of proximal  stomach due to food products. Impression:               - LA Grade A reflux esophagitis with no bleeding.                           - Low-grade of narrowing Schatzki ring.                           - Z-line, 36 cm from the incisors.                           - A large amount of food (residue) in the stomach.                           - Non-bleeding gastric ulcers with  no stigmata of                            bleeding.                           - Acute gastritis. Biopsied.                           - Normal examined duodenum.                           - Duodenal deformity.                           - Medium-sized hiatal hernia. Moderate Sedation:      N/A - MAC procedure Recommendation:           - Await pathology results.                           - Clear liquid diet.                           - Continue Protonix drip. Procedure Code(s):        --- Professional ---                           [phone], Esophagogastroduodenoscopy, flexible,                            transoral; with biopsy, single or multiple Diagnosis Code(s):        --- Professional ---                           K92.0, Hematemesis                           K25.9, Gastric ulcer, unspecified as acute or                            chronic, without hemorrhage or perforation  K22.2, Esophageal obstruction                           K29.00, Acute gastritis without bleeding                           K21.00, Gastro-esophageal reflux disease with                            esophagitis, without bleeding                           K44.9, Diaphragmatic hernia without obstruction or                            gangrene                           K31.89, Other diseases of stomach and duodenum CPT copyright 2022 American [employer] Association. All rights reserved. The codes documented in this report are preliminary and upon coder review may  be revised to meet current compliance requirements. [name], MD 11/12/2022 9:02:16 AM This report has been signed electronically. Number of Addenda: 0

## 2022-11-12 NOTE — Interval H&P Note (Signed)
History and Physical Interval Note:  11/12/2022 8:30 AM  [name]  has presented today for surgery, with the diagnosis of Hematemesis.  The various methods of treatment have been discussed with the patient and family. After consideration of risks, benefits and other options for treatment, the patient has consented to  Procedure(s): ESOPHAGOGASTRODUODENOSCOPY (EGD) (N/A) as a surgical intervention.  The patient's history has been reviewed, patient examined, no change in status, stable for surgery.  I have reviewed the patient's chart and labs.  Questions were answered to the patient's satisfaction.     [name]

## 2022-11-12 NOTE — Hospital Course (Addendum)
47yo female with h/o polysubstance abuse and prior UGI bleeding from gastric/duodenal ulcer who presented on 9/2 with abdominal pain and hematemesis.  Hgb stable.  She was incidentally noted on CT to have a large R adnexal mass and pelvic US was ordered and showed probable exophytic uterine fibroid, ok for outpatient GYN f/u.  EGD on 9/3 with grade A esophagitis, 2 non-bleeding cratered gastric ulcers, acute gastritis, and significant food residue in the cardia, gastric fundus, and gastric body.

## 2022-11-12 NOTE — Anesthesia Preprocedure Evaluation (Signed)
Anesthesia Evaluation  Patient identified by MRN, date of birth, ID band Patient awake    Reviewed: Allergy & Precautions, NPO status , Patient's Chart, lab work & pertinent test results  History of Anesthesia Complications Negative for: history of anesthetic complications  Airway Mallampati: III  TM Distance: >3 FB Neck ROM: Full    Dental  (+) Dental Advisory Given Missing several teeth. Denies loose teeth.:   Pulmonary neg shortness of breath, sleep apnea (does not use CPAP) , neg COPD, neg recent URI, Current Smoker and Patient abstained from smoking.   Pulmonary exam normal breath sounds clear to auscultation       Cardiovascular hypertension, (-) angina (-) Past MI, (-) Cardiac Stents and (-) CABG (-) dysrhythmias  Rhythm:Regular Rate:Normal     Neuro/Psych  PSYCHIATRIC DISORDERS Anxiety Depression    negative neurological ROS     GI/Hepatic PUD,GERD  ,,(+)     substance abuse  cocaine use and marijuana useChronic abdominal pain   Endo/Other  negative endocrine ROS    Renal/GU negative Renal ROS     Musculoskeletal   Abdominal   Peds  Hematology  (+) Blood dyscrasia, anemia   Anesthesia Other Findings 47 year old female with medical history significant for hypertension, GERD, esophagitis, duodenal ulcer, polysubstance abuse (cocaine, opiates, THC, tobacco) acute blood loss anemia from GI bleed, depression, presented to the ED with complaints of progressive abdominal pain x 2 days with associated nausea, vomiting, some episodes of hematemesis and melena.  She has been using Goody's powder lately but also took Pepto-Bismol.  Admitted for suspected acute upper GI bleed due to PUD.  Reports last cocaine use either the day of (6/28) or the day prior to admission. Last vomited 2 days ago.  Reproductive/Obstetrics                             Anesthesia Physical Anesthesia Plan  ASA:  3  Anesthesia Plan: MAC   Post-op Pain Management: Tylenol PO (pre-op)*   Induction: Intravenous  PONV Risk Score and Plan: 1 and Propofol infusion and Treatment may vary due to age or medical condition  Airway Management Planned: Natural Airway and Nasal Cannula  Additional Equipment:   Intra-op Plan:   Post-operative Plan:   Informed Consent: I have reviewed the patients History and Physical, chart, labs and discussed the procedure including the risks, benefits and alternatives for the proposed anesthesia with the patient or authorized representative who has indicated his/her understanding and acceptance.     Dental advisory given  Plan Discussed with: CRNA and Anesthesiologist  Anesthesia Plan Comments: (Discussed with patient risks of MAC including, but not limited to, minor pain or discomfort, hearing people in the room, and possible need for backup general anesthesia. Risks for general anesthesia also discussed including, but not limited to, sore throat, hoarse voice, chipped/damaged teeth, injury to vocal cords, nausea and vomiting, allergic reactions, lung infection, heart attack, stroke, and death. All questions answered. )        Anesthesia Quick Evaluation

## 2022-11-12 NOTE — TOC Progression Note (Addendum)
Transition of Care [facility]) - Progression Note    Patient Details  Name: [name] MRN: [id number] Date of Birth: 21-Dec-1975  Transition of Care [facility]) CM/SW Contact  Geni Bers, RN Phone Number: 11/12/2022, 11:58 AM  Clinical Narrative:    Spoke with pt concerning disposition and PCP.  Pt states that she lives in a hotel when she have money, homeless shelter when there is room. Pt states that she had been to [facility], there are no places available, they are capacity. Pt use [facility] for medical needs. Pt continued that if she could get to a free clinic she would go. Pt states she can not  live with her family because they are at their capacity.   Expected Discharge Plan: Homeless Shelter Barriers to Discharge: No Barriers Identified  Expected Discharge Plan and Services   Discharge Planning Services: CM Consult   Living arrangements for the past 2 months: Homeless Shelter                                       Social Determinants of Health (SDOH) Interventions SDOH Screenings   Food Insecurity: Food Insecurity Present (11/11/2022)  Housing: High Risk (11/11/2022)  Transportation Needs: Unmet Transportation Needs (11/11/2022)  Utilities: At Risk (11/11/2022)  Tobacco Use: High Risk (11/12/2022)    Readmission Risk Interventions     No data to display

## 2022-11-12 NOTE — Progress Notes (Signed)
Patient returned to room post Endoscopy. VSS.

## 2022-11-13 LAB — BASIC METABOLIC PANEL
Anion gap: 7 (ref 5–15)
BUN: 5 mg/dL — ABNORMAL LOW (ref 6–20)
CO2: 24 mmol/L (ref 22–32)
Calcium: 7.9 mg/dL — ABNORMAL LOW (ref 8.9–10.3)
Chloride: 102 mmol/L (ref 98–111)
Creatinine, Ser: 0.39 mg/dL — ABNORMAL LOW (ref 0.44–1.00)
GFR, Estimated: >60 mL/min (ref >60)
Glucose, Bld: 104 mg/dL — ABNORMAL HIGH (ref 70–99)
Potassium: 3.9 mmol/L (ref 3.5–5.1)
Sodium: 133 mmol/L — ABNORMAL LOW (ref 135–145)

## 2022-11-13 LAB — CBC WITH DIFFERENTIAL/PLATELET
Abs Immature Granulocytes: 0.05 10*3/uL (ref 0.00–0.07)
Basophils Absolute: 0 10*3/uL (ref 0.0–0.1)
Basophils Relative: 0 %
Eosinophils Absolute: 0.1 10*3/uL (ref 0.0–0.5)
Eosinophils Relative: 1 %
HCT: 25.3 % — ABNORMAL LOW (ref 36.0–46.0)
Hemoglobin: 7.8 g/dL — ABNORMAL LOW (ref 12.0–15.0)
Immature Granulocytes: 1 %
Lymphocytes Relative: 15 %
Lymphs Abs: 1.6 10*3/uL (ref 0.7–4.0)
MCH: 26.2 pg (ref 26.0–34.0)
MCHC: 30.8 g/dL (ref 30.0–36.0)
MCV: 84.9 fL (ref 80.0–100.0)
Monocytes Absolute: 0.6 10*3/uL (ref 0.1–1.0)
Monocytes Relative: 6 %
Neutro Abs: 8.1 10*3/uL — ABNORMAL HIGH (ref 1.7–7.7)
Neutrophils Relative %: 77 %
Platelets: 649 10*3/uL — ABNORMAL HIGH (ref 150–400)
RBC: 2.98 MIL/uL — ABNORMAL LOW (ref 3.87–5.11)
RDW: 15.8 % — ABNORMAL HIGH (ref 11.5–15.5)
WBC: 10.5 10*3/uL (ref 4.0–10.5)
nRBC: 0 % (ref 0.0–0.2)

## 2022-11-13 MED ORDER — PANTOPRAZOLE SODIUM 40 MG IV SOLR
40.0000 mg | Freq: Two times a day (BID) | INTRAVENOUS | Status: DC
  Administered 2022-11-13 – 2022-11-14 (×2): 40 mg via INTRAVENOUS
  Filled 2022-11-13 (×2): qty 10

## 2022-11-13 NOTE — Progress Notes (Signed)
PROGRESS NOTE    [name]  BJY:[id number] DOB: April 11, 1975 DOA: 11/11/2022 PCP: Patient, No Pcp Per   Brief Narrative:47yo female with h/o polysubstance abuse and prior UGI bleeding from gastric/duodenal ulcer who presented on 9/2 with abdominal pain and hematemesis. Hgb stable. She was incidentally noted on CT to have a large R adnexal mass and pelvic US was ordered and showed probable exophytic uterine fibroid, ok for outpatient GYN f/u. EGD on 9/3 with grade A esophagitis, 2 non-bleeding cratered gastric ulcers, acute gastritis, and significant food residue in the cardia, gastric fundus, and gastric body.  Assessment & Plan:   Principal Problem:   Upper GI bleed Active Problems:   Polysubstance abuse (HCC)   Acute blood loss anemia  Upper GI Bleed Patient is presenting with abdominal pain and hematemesis, suggestive of upper GI bleeding. She underwent EGD on 9/3 which showed grade A esophagitis, 2 non-bleeding cratered gastric ulcers, acute gastritis, and significant food residue in the cardia, gastric fundus, and gastric body Needs IV pantoprazole bolus and infusion x 72 hours and then transition to IV/PO PPI Zofran IV for nausea Avoid NSAIDs and SQ heparin Advance to soft diet today  ABLA Her Hgb decreased from 11.3 on 6/30 to 8.9 -> 8.7 today.  Type and screen ordered Monitor closely and follow cbc daily, transfuse as necessary for Hbg <7   Adnexal mass Pelvic US with probable exophytic uterine fibroid Needs outpatient GYN f/u   Polysubstance abuse Cessation encouraged; this should be encouraged on an ongoing basis UDS positive for opiates, cocaine, THC She denies having an issue with drugs  Estimated body mass index is 19.83 kg/m as calculated from the following:   Height as of this encounter: 5\' 1"  (1.549 m).   Weight as of this encounter: 47.6 kg.  DVT prophylaxis: SCD D  code Status: Full code  family Communication: None  disposition Plan:  Status is:  Inpatient Remains inpatient appropriate because: GI bleed   Consultants:  GI  Procedures: EGD Antimicrobials: None  Subjective: Anxious to eat complains of being hungry no abdominal pain nausea vomiting diarrhea GI recommends to continue PPI IV drip for 72 hours  Objective: Vitals:   11/12/22 0948 11/12/22 1232 11/12/22 2145 11/13/22 0529  BP: 139/88 (!) 131/94 (!) 146/93 (!) 140/82  Pulse: 73 73 89 92  Resp: 18 18 19 19   Temp: 98.2 F (36.8 C) 98.2 F (36.8 C) 98.1 F (36.7 C) 98.9 F (37.2 C)  TempSrc: Oral Oral Oral Oral  SpO2: 100% 100% 97% 97%  Weight:      Height:        Intake/Output Summary (Last 24 hours) at 11/13/2022 1334 Last data filed at 11/13/2022 0600 Gross per 24 hour  Intake 2521.99 ml  Output --  Net 2521.99 ml   Filed Weights   11/11/22 0046 11/12/22 0744  Weight: 47.6 kg 47.6 kg    Examination:  General exam: Appears in no acute distress Respiratory system: Clear to auscultation. Respiratory effort normal. Cardiovascular system: S1 & S2 heard, RRR. No JVD, murmurs, rubs, gallops or clicks. No pedal edema. Gastrointestinal system: Abdomen is nondistended, soft and nontender. No organomegaly or masses felt. Normal bowel sounds heard. Central nervous system: Alert and oriented. No focal neurological deficits. Extremities: Symmetric 5 x 5 power.  Data Reviewed: I have personally reviewed following labs and imaging studies  CBC: Recent Labs  Lab 11/11/22 0623 11/11/22 1128 11/11/22 1718 11/12/22 0140 11/12/22 0945 11/13/22 0455  WBC 10.4  --   --   --  12.1* 10.5  NEUTROABS 8.3*  --   --   --  10.0* 8.1*  HGB 8.9* 8.8* 9.0* 7.6* 8.7* 7.8*  HCT 28.2* 28.0* 28.9* 24.7* 28.0* 25.3*  MCV 83.2  --   --   --  86.4 84.9  PLT 714*  --   --   --  710* 649*   Basic Metabolic Panel: Recent Labs  Lab 11/11/22 0623 11/12/22 0140 11/13/22 0455  NA 136 137 133*  K 3.3* 3.6 3.9  CL 101 107 102  CO2 26 24 24   GLUCOSE 83 103* 104*  BUN 7 12 5*   CREATININE 0.54 0.75 0.39*  CALCIUM 9.2 8.2* 7.9*   GFR: Estimated Creatinine Clearance: 63.9 mL/min (A) (by C-G formula based on SCr of 0.39 mg/dL (L)). Liver Function Tests: Recent Labs  Lab 11/11/22 0623  AST 16  ALT 9  ALKPHOS 56  BILITOT 0.3  PROT 7.7  ALBUMIN 2.9*   Recent Labs  Lab 11/11/22 0623  LIPASE 29   No results for input(s): "AMMONIA" in the last 168 hours. Coagulation Profile: No results for input(s): "INR", "PROTIME" in the last 168 hours. Cardiac Enzymes: No results for input(s): "CKTOTAL", "CKMB", "CKMBINDEX", "TROPONINI" in the last 168 hours. BNP (last 3 results) No results for input(s): "PROBNP" in the last 8760 hours. HbA1C: No results for input(s): "HGBA1C" in the last 72 hours. CBG: No results for input(s): "GLUCAP" in the last 168 hours. Lipid Profile: No results for input(s): "CHOL", "HDL", "LDLCALC", "TRIG", "CHOLHDL", "LDLDIRECT" in the last 72 hours. Thyroid Function Tests: No results for input(s): "TSH", "T4TOTAL", "FREET4", "T3FREE", "THYROIDAB" in the last 72 hours. Anemia Panel: No results for input(s): "VITAMINB12", "FOLATE", "FERRITIN", "TIBC", "IRON", "RETICCTPCT" in the last 72 hours. Sepsis Labs: No results for input(s): "PROCALCITON", "LATICACIDVEN" in the last 168 hours.  No results found for this or any previous visit (from the past 240 hour(s)).    Radiology Studies: No results found.  Scheduled Meds:  feeding supplement  237 mL Oral BID BM   [START ON 11/14/2022] pantoprazole  40 mg Intravenous Q12H   Continuous Infusions:  sodium chloride 50 mL/hr at 11/13/22 0048   pantoprazole 8 mg/hr (11/13/22 0401)     LOS: 2 days   Alwyn Ren, MD  11/13/2022, 1:34 PM

## 2022-11-13 NOTE — Discharge Instructions (Signed)
Medical Care  Agency Name: [facility] Address: [phone] [place], [place] [address] Phone: [phone] Website: [email or web address] Service(s) Offered: Provides primary care services for adults who may or  may not have health insurance.  Agency Name: [facility] Address: [address]  [place], [place] [address] Phone: [phone] Website: [email or web address] Service(s) Offered: Provides primary and specialty care access for  Eligible uninsured residents of [facility] That are at or below 200% of the Federal Poverty Level. May 23, 2021 14  Agency Name: [facility] Department Address: [address] [place], [place] [address] Phone: [phone] Website: [email or web address] Service(s) Offered: Provides a wide range of adult and child health  services for uninsured / underinsured low-income  [place] residents.  Agency Name: [facility] Address: [address] [place], [place] Phone: [phone] Website: [email or web address] Service(s) Offered: Primary care services for [facility]  residents that cannot afford medical insurance.  ([employer]) Agency Name: [facility] Address: [address] [place], [place] [address] Phone: [phone] Website: [email or web address] Service(s) Offered: Primary care services to adults and children 12 years and older in [place].  Agency Name: [facility] Jodi Mourning) Address: Network Locations in [place] and [place], [place] Phone: [phone] or after hours [phone] Website: [email or web address] Service(s) Offered: Adult & Pediatric quality health services Behavioral health care, (FIT) formerly  incarcerated transition program.

## 2022-11-13 NOTE — Plan of Care (Signed)

## 2022-11-13 NOTE — Plan of Care (Signed)
?  Problem: Elimination: ?Goal: Will not experience complications related to bowel motility ?Outcome: Progressing ?  ?Problem: Pain Managment: ?Goal: General experience of comfort will improve ?Outcome: Progressing ?  ?Problem: Safety: ?Goal: Ability to remain free from injury will improve ?Outcome: Progressing ?  ?

## 2022-11-13 NOTE — TOC Progression Note (Signed)
Transition of Care [facility]) - Progression Note    Patient Details  Name: [name] MRN: [id number] Date of Birth: 1975/12/27  Transition of Care [facility]) CM/SW Contact  Giordan Fordham, Olegario Messier, RN Phone Number: 11/13/2022, 2:40 PM  Clinical Narrative: Resources provided in d/c section;will provide bus pass if needed for transp.      Expected Discharge Plan: Homeless Shelter Barriers to Discharge: No Barriers Identified  Expected Discharge Plan and Services   Discharge Planning Services: CM Consult   Living arrangements for the past 2 months: Homeless Shelter                                       Social Determinants of Health (SDOH) Interventions SDOH Screenings   Food Insecurity: Food Insecurity Present (11/11/2022)  Housing: High Risk (11/11/2022)  Transportation Needs: Unmet Transportation Needs (11/11/2022)  Utilities: At Risk (11/11/2022)  Tobacco Use: High Risk (11/12/2022)    Readmission Risk Interventions     No data to display

## 2022-11-13 NOTE — Progress Notes (Signed)
[facility] Gastroenterology Progress Note  BELLANIE ASSMANN 47 y.o. [birth year]   Subjective: Hungry. Denies abdominal pain.  Objective: Vital signs: Vitals:   11/12/22 2145 11/13/22 0529  BP: (!) 146/93 (!) 140/82  Pulse: 89 92  Resp: 19 19  Temp: 98.1 F (36.7 C) 98.9 F (37.2 C)  SpO2: 97% 97%    Physical Exam: Gen: lethargic, disheveled, thin, no acute distress  HEENT: anicteric sclera CV: RRR Chest: CTA B Abd: soft, nontender, nondistended, +BS Ext: no edema  Lab Results: Recent Labs    11/12/22 0140 11/13/22 0455  NA 137 133*  K 3.6 3.9  CL 107 102  CO2 24 24  GLUCOSE 103* 104*  BUN 12 5*  CREATININE 0.75 0.39*  CALCIUM 8.2* 7.9*   Recent Labs    11/11/22 0623  AST 16  ALT 9  ALKPHOS 56  BILITOT 0.3  PROT 7.7  ALBUMIN 2.9*   Recent Labs    11/12/22 0945 11/13/22 0455  WBC 12.1* 10.5  NEUTROABS 10.0* 8.1*  HGB 8.7* 7.8*  HCT 28.0* 25.3*  MCV 86.4 84.9  PLT 710* 649*      Assessment/Plan: Large gastric ulcer (clean-based); No further bleeding. Hgb 7.8 (8.7). Biopsies pending. Ok to eat solid food. Needs to stay on a PPI 40 mg BID for 3 months and then every day at discharge. Will need repeat EGD in 3 months. Avoid NSAIDs. [facility] will sign off. Call if questions.   [name] 11/13/2022, 9:57 AM  Questions please call [phone] ID: Pollyann Samples, female   DOB: [birth year], 47 y.o.   MRN: [id number]

## 2022-11-14 ENCOUNTER — Other Ambulatory Visit (HOSPITAL_COMMUNITY): Payer: Self-pay

## 2022-11-14 LAB — BASIC METABOLIC PANEL
Anion gap: 7 (ref 5–15)
BUN: 9 mg/dL (ref 6–20)
CO2: 27 mmol/L (ref 22–32)
Calcium: 8.5 mg/dL — ABNORMAL LOW (ref 8.9–10.3)
Chloride: 102 mmol/L (ref 98–111)
Creatinine, Ser: 0.53 mg/dL (ref 0.44–1.00)
GFR, Estimated: >60 mL/min (ref >60)
Glucose, Bld: 103 mg/dL — ABNORMAL HIGH (ref 70–99)
Potassium: 3.9 mmol/L (ref 3.5–5.1)
Sodium: 136 mmol/L (ref 135–145)

## 2022-11-14 LAB — SURGICAL PATHOLOGY

## 2022-11-14 LAB — CBC
HCT: 24 % — ABNORMAL LOW (ref 36.0–46.0)
Hemoglobin: 7.7 g/dL — ABNORMAL LOW (ref 12.0–15.0)
MCH: 27.1 pg (ref 26.0–34.0)
MCHC: 32.1 g/dL (ref 30.0–36.0)
MCV: 84.5 fL (ref 80.0–100.0)
Platelets: 609 10*3/uL — ABNORMAL HIGH (ref 150–400)
RBC: 2.84 MIL/uL — ABNORMAL LOW (ref 3.87–5.11)
RDW: 15.6 % — ABNORMAL HIGH (ref 11.5–15.5)
WBC: 12.3 10*3/uL — ABNORMAL HIGH (ref 4.0–10.5)
nRBC: 0 % (ref 0.0–0.2)

## 2022-11-14 MED ORDER — PANTOPRAZOLE SODIUM 40 MG PO TBEC: 40.0000 mg | DELAYED_RELEASE_TABLET | Freq: Two times a day (BID) | ORAL | 0 refills | Status: DC

## 2022-11-14 MED ORDER — PANTOPRAZOLE SODIUM 40 MG PO TBEC
40.0000 mg | DELAYED_RELEASE_TABLET | Freq: Two times a day (BID) | ORAL | 0 refills | Status: DC
  Filled 2022-11-14: qty 60, 30d supply, fill #0

## 2022-11-14 NOTE — Progress Notes (Signed)
Pt waiting on med  to be filled at out pt pharmacy- pharmacy is waiting on an order. Pt's ride is here

## 2022-11-14 NOTE — Discharge Summary (Addendum)
Physician Discharge Summary  [name]:[id number] DOB: 09-11-1975 DOA: 11/11/2022  PCP: Patient, No Pcp Per  Admit date: 11/11/2022 Discharge date: 11/14/2022  Admitted From: home Disposition:  home  Recommendations for Outpatient Follow-up:  Follow up with PCP in 1-2 weeks Please obtain BMP/CBC in one week Please follow up with GI Dr Bosie Clos  Home Health:no Equipment/Devices:none  Discharge Condition:stable CODE STATUS:full Diet recommendation: cardiac Brief/Interim Summary:  47yo female with h/o polysubstance abuse and prior UGI bleeding from gastric/duodenal ulcer who presented on 9/2 with abdominal pain and hematemesis. Hgb stable. She was incidentally noted on CT to have a large R adnexal mass and pelvic US was ordered and showed probable exophytic uterine fibroid, ok for outpatient GYN f/u. EGD on 9/3 with grade A esophagitis, 2 non-bleeding cratered gastric ulcers, acute gastritis, and significant food residue in the cardia, gastric fundus, and gastric body.    Discharge Diagnoses:  Principal Problem:   Upper GI bleed Active Problems:   Polysubstance abuse (HCC)   Acute blood loss anemia  Upper GI Bleed Patient  presented with abdominal pain and hematemesis, suggestive of upper GI bleeding. She underwent EGD on 9/3 which showed grade A esophagitis, 2 non-bleeding cratered gastric ulcers, acute gastritis, and significant food residue in the cardia, gastric fundus, and gastric body She was treated with IV pantoprazole bolus and infusion x 72 hours and then transition to PO PPI.   ABLA-hemoglobin 7.7 from 11.3 partly from hemodilution continue PPI see above.  She did not require any blood transfusion during the hospital stay.  Adnexal mass Pelvic US with probable exophytic uterine fibroid Needs outpatient GYN f/u   Polysubstance abuse Cessation encouraged; this should be encouraged on an ongoing basis UDS positive for opiates, cocaine, THC   Fibroids found on  ultrasound patient was referred to GYN as an outpatient  Estimated body mass index is 19.83 kg/m as calculated from the following:   Height as of this encounter: 5\' 1"  (1.549 m).   Weight as of this encounter: 47.6 kg.  Discharge Instructions  Discharge Instructions     Diet - low sodium heart healthy   Complete by: As directed    Increase activity slowly   Complete by: As directed       Allergies as of 11/14/2022       Reactions   Hydrocodone Nausea And Vomiting        Medication List     TAKE these medications    acetaminophen 500 MG tablet Commonly known as: TYLENOL Take 500-1,000 mg by mouth every 6 (six) hours as needed for moderate pain.   MULTIVITAL PO Take 1 tablet by mouth daily.   pantoprazole 40 MG tablet Commonly known as: Protonix Take 1 tablet (40 mg total) by mouth 2 (two) times daily before a meal.        Follow-up Information     Gastroenter[facility], [facility] .   Contact information: [address] [place] [address] [phone]         Candice Camp, MD Follow up.   Specialty: Obstetrics and Gynecology Why: follow up for fibroids Contact information: [address] [place], [address] [place] [address] [phone]                Allergies  Allergen Reactions   Hydrocodone Nausea And Vomiting   Consultations: gi  Procedures/Studies: US PELVIC COMPLETE WITH TRANSVAGINAL  Result Date: 11/11/2022 CLINICAL DATA:  Right adnexal mass on abdominopelvic CT performed for epigastric  pain. EXAM: TRANSABDOMINAL AND TRANSVAGINAL ULTRASOUND OF PELVIS TECHNIQUE: Both transabdominal and transvaginal ultrasound examinations of the pelvis were performed. Transabdominal technique was performed for global imaging of the pelvis including uterus, ovaries, adnexal regions, and pelvic cul-de-sac. It was necessary to proceed with endovaginal exam following the transabdominal exam to visualize the endometrium and ovaries to better  advantage. COMPARISON:  Abdominopelvic CT 11/11/2022 and 09/06/2022. Pelvic ultrasound 11/27/2014. FINDINGS: Uterus Measurements: 9.2 x 4.9 x 5.5 cm = volume: 131.6 mL. Several uterine fibroids are noted. There is a fundal fibroid measuring approximately 3.1 x 2.8 x 2.9 cm. Submucosal fibroid centrally in the uterine body measures approximately 2.5 x 2.0 x 1.9 cm. Within the right adnexa, there is a 3.8 x 3.1 x 4.2 cm lesion which likely corresponds with the CT finding and is likely an exophytic uterine fibroid. Endometrium Thickness: 3 mm.  No focal abnormality visualized. Right ovary Measurements: 2.0 x 2.1 x 2.2 cm = volume: 5.0 mL. Normal appearance/no adnexal mass. Left ovary Measurements: 2.6 x 2.0 x 1.9 cm = volume: 3.4 mL. Normal appearance/no adnexal mass. Other findings No abnormal free fluid. IMPRESSION: 1. Uterine fibroids. 2. Right adnexal lesion likely corresponds with the CT finding and is likely an exophytic uterine fibroid. Suggest GYN follow-up. 3. Both ovaries are seen separately and appear normal. Electronically Signed   By: Carey Bullocks M.D.   On: 11/11/2022 10:45   CT ABDOMEN PELVIS W CONTRAST  Result Date: 11/11/2022 CLINICAL DATA:  47 year old female with history of epigastric pain. EXAM: CT ABDOMEN AND PELVIS WITH CONTRAST TECHNIQUE: Multidetector CT imaging of the abdomen and pelvis was performed using the standard protocol following bolus administration of intravenous contrast. RADIATION DOSE REDUCTION: This exam was performed according to the departmental dose-optimization program which includes automated exposure control, adjustment of the mA and/or kV according to patient size and/or use of iterative reconstruction technique. CONTRAST:  OMNIPAQUE IOHEXOL 300 MG/ML  SOLN COMPARISON:  CTA of the chest, abdomen and pelvis 09/06/2022. FINDINGS: Comment: Portions of today's examination are limited by substantial patient respiratory motion. Lower chest: Unremarkable.  Hepatobiliary: No suspicious cystic or solid hepatic lesions. Diffuse low attenuation throughout the hepatic parenchyma, indicative of a background of hepatic steatosis. No intra or extrahepatic biliary ductal dilatation. Status post cholecystectomy. Pancreas: No pancreatic mass. No pancreatic ductal dilatation. No peripancreatic fluid collections or inflammatory changes. Spleen: Unremarkable. Adrenals/Urinary Tract: Bilateral kidneys and adrenal glands are normal in appearance. No hydroureteronephrosis. Urinary bladder is unremarkable in appearance. 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 Stomach/Bowel: The appearance of the stomach is unremarkable. No pathologic dilatation of small bowel or colon. The appendix is not confidently identified and may be surgically absent. Regardless, there are no inflammatory changes noted adjacent to the cecum to suggest the presence of an acute appendicitis at this time. Vascular/Lymphatic: Atherosclerosis in the abdominal aorta. No definite aneurysm or dissection noted in the abdominal or pelvic vasculature. Reproductive: Uterus is heterogeneous in appearance with multiple heterogeneously enhancing lesions, likely to represent small fibroids, largest of which measures approximately 2.1 cm in the uterine body (sagittal image 81 of series 10). In addition, there is a large heterogeneously enhancing lesion in the right adnexal region estimated to measure approximately 5.5 x 4.4 x 4.2 cm (axial image 52 of series 2 and coronal image 81 of series 8), concerning for potential  ovarian neoplasm. Left ovary is unremarkable in appearance. Other: Small umbilical hernia containing a small amount of omental fat and some ascites. Trace volume of ascites. No pneumoperitoneum. Musculoskeletal: There are no aggressive appearing lytic or  blastic lesions noted in the visualized portions of the skeleton. IMPRESSION: 1. Small umbilical hernia containing only omental fat and a trace volume of ascites. 2. Large heterogeneously enhancing right adnexal mass concerning for potential ovarian neoplasm. Further evaluation with pelvic ultrasound is strongly recommended in the near future to better evaluate this finding. Uterus is also heterogeneous in appearance, suggesting the presence of multiple fibroids. 3. Hepatic steatosis. 4. Aortic atherosclerosis. 5. Additional incidental findings, as above. Electronically Signed   By: Trudie Reed M.D.   On: 11/11/2022 08:06   DG Chest 2 View  Result Date: 11/11/2022 CLINICAL DATA:  47 year old female with history of chest pain. EXAM: CHEST - 2 VIEW COMPARISON:  Chest x-ray 09/06/2022. FINDINGS: Lung volumes are normal. No consolidative airspace disease. No pleural effusions. No pneumothorax. No pulmonary nodule or mass noted. Pulmonary vasculature and the cardiomediastinal silhouette are within normal limits. IMPRESSION: No radiographic evidence of acute cardiopulmonary disease. Electronically Signed   By: Trudie Reed M.D.   On: 11/11/2022 07:54   (Echo, Carotid, EGD, Colonoscopy, ERCP)    Subjective:  Anxious to go home Discharge Exam: Vitals:   11/14/22 0637 11/14/22 1309  BP: (!) 129/91 133/81  Pulse: 98 78  Resp: 19 18  Temp: 97.9 F (36.6 C) 99.2 F (37.3 C)  SpO2: 98% 100%   Vitals:   11/13/22 1341 11/13/22 2015 11/14/22 0637 11/14/22 1309  BP: (!) 148/74 (!) 132/95 (!) 129/91 133/81  Pulse: 83 98 98 78  Resp: 17 19 19 18   Temp: 99.9 F (37.7 C) 99.3 F (37.4 C) 97.9 F (36.6 C) 99.2 F (37.3 C)  TempSrc: Oral Oral Oral Oral  SpO2: 100% 97% 98% 100%  Weight:      Height:        General: Pt is alert, awake, not in acute distress Cardiovascular: RRR, S1/S2 +, no rubs, no gallops Respiratory: CTA bilaterally, no wheezing, no rhonchi Abdominal: Soft, NT, ND, bowel  sounds + Extremities: no edema, no cyanosis    The results of significant diagnostics from this hospitalization (including imaging, microbiology, ancillary and laboratory) are listed below for reference.     Microbiology: No results found for this or any previous visit (from the past 240 hour(s)).   Labs: BNP (last 3 results) No results for input(s): "BNP" in the last 8760 hours. Basic Metabolic Panel: Recent Labs  Lab 11/11/22 0623 11/12/22 0140 11/13/22 0455 11/14/22 0444  NA 136 137 133* 136  K 3.3* 3.6 3.9 3.9  CL 101 107 102 102  CO2 26 24 24 27   GLUCOSE 83 103* 104* 103*  BUN 7 12 5* 9  CREATININE 0.54 0.75 0.39* 0.53  CALCIUM 9.2 8.2* 7.9* 8.5*   Liver Function Tests: Recent Labs  Lab 11/11/22 0623  AST 16  ALT 9  ALKPHOS 56  BILITOT 0.3  PROT 7.7  ALBUMIN 2.9*   Recent Labs  Lab 11/11/22 0623  LIPASE 29   No results for input(s): "AMMONIA" in the last 168 hours. CBC: Recent Labs  Lab 11/11/22 0623 11/11/22 1128 11/11/22 1718 11/12/22 0140 11/12/22 0945 11/13/22 0455 11/14/22 0444  WBC 10.4  --   --   --  12.1* 10.5 12.3*  NEUTROABS 8.3*  --   --   --  10.0* 8.1*  --  HGB 8.9*   < > 9.0* 7.6* 8.7* 7.8* 7.7*  HCT 28.2*   < > 28.9* 24.7* 28.0* 25.3* 24.0*  MCV 83.2  --   --   --  86.4 84.9 84.5  PLT 714*  --   --   --  710* 649* 609*   < > = values in this interval not displayed.   Cardiac Enzymes: No results for input(s): "CKTOTAL", "CKMB", "CKMBINDEX", "TROPONINI" in the last 168 hours. BNP: Invalid input(s): "POCBNP" CBG: No results for input(s): "GLUCAP" in the last 168 hours. D-Dimer No results for input(s): "DDIMER" in the last 72 hours. Hgb A1c No results for input(s): "HGBA1C" in the last 72 hours. Lipid Profile No results for input(s): "CHOL", "HDL", "LDLCALC", "TRIG", "CHOLHDL", "LDLDIRECT" in the last 72 hours. Thyroid function studies No results for input(s): "TSH", "T4TOTAL", "T3FREE", "THYROIDAB" in the last 72  hours.  Invalid input(s): "FREET3" Anemia work up No results for input(s): "VITAMINB12", "FOLATE", "FERRITIN", "TIBC", "IRON", "RETICCTPCT" in the last 72 hours. Urinalysis    Component Value Date/Time   COLORURINE YELLOW 07/13/2017 0452   APPEARANCEUR HAZY (A) 07/13/2017 0452   LABSPEC 1.019 07/13/2017 0452   PHURINE 6.0 07/13/2017 0452   GLUCOSEU NEGATIVE 07/13/2017 0452   HGBUR SMALL (A) 07/13/2017 0452   BILIRUBINUR NEGATIVE 07/13/2017 0452   KETONESUR NEGATIVE 07/13/2017 0452   PROTEINUR NEGATIVE 07/13/2017 0452   UROBILINOGEN 0.2 12/21/2014 2007   NITRITE NEGATIVE 07/13/2017 0452   LEUKOCYTESUR LARGE (A) 07/13/2017 0452   Sepsis Labs Recent Labs  Lab 11/11/22 0623 11/12/22 0945 11/13/22 0455 11/14/22 0444  WBC 10.4 12.1* 10.5 12.3*   Microbiology No results found for this or any previous visit (from the past 240 hour(s)).   Time coordinating discharge: 38 minutes  SIGNED  Alwyn Ren, MD  [facility] 11/14/2022, 5:15 PM

## 2022-11-14 NOTE — TOC Transition Note (Signed)
Transition of Care [facility]) - CM/SW Discharge Note   Patient Details  Name: [name] MRN: [id number] Date of Birth: 1975/09/19  Transition of Care [facility]) CM/SW Contact:  Lanier Clam, RN Phone Number: 11/14/2022, 12:19 PM   Clinical Narrative:    MATCH MEDICATION ASSISTANCE CARD Pharmacies please call [phone] for claim processing assistance.  Rx BIN: [id number] Rx Group: [id number] Rx PCN: [id number] Relationship Code: 1 Person Code: 01  Patient (MRN):MOSES   [id number]      Patient Name: [name]     Patient DOB: 06-Jan-1976     Discharge Date: 11/14/2022  Expiration Date:11/21/2022 (must be filled within 7 days of discharge)   Dear Ms. Blanchie Serve You have been approved to have the prescriptions written by your discharging physician filled through our [facility] (Medication Assistance Through [facility]) program. This program allows for a one-time (no refills) 34-day supply of selected medications for a low copay amount.  The copay is $3.00 per prescription. For instance, if you have one prescription, you will pay $3.00; for two prescriptions, you pay $6.00; for three prescriptions, you pay $9.00; and so on. Only certain pharmacies are participating in this program with [facility]. You will need to select one of the pharmacies from the attached lists and take your prescriptions, this letter, and your photo ID to one of the participating pharmacies.  We are excited that you are able to use the [facility] program to get your medications. These prescriptions must be filled within 7 days of hospital discharge or they will no longer be valid for the [facility] program. Should you have any problems with your prescriptions please contact your case management team member at [phone] for [place] or [phone] for [facility].  Thank you, [facility]    [facility] Pharmacies [place]. [facility], [facility], [facility]  [facility] [address] [place], [place] [address] [place], [place] [address] [place], [employer] [address] [place], [place] [address] [place], [place] 254 Transitions of Care - [facility] [address] [place] 1238 [place], [place], [place] [facility] [address] [place] [address], [facility] [address] [place]  [address] [place], [place] [address] [place], [place] 401 [place], [place] [address] [employer]  [address] [place], [place]  [address], [place] [address] [place], [place]  [address] [place], [place]  [address]10 [place], [place]  605 [place], [place]  [address] [place], [place] [address], [place]  [address] [place], [place] 1628 [place], [place] [address], [facility] [address], [place] [address] [place], [place] 4700 [place], [place] 1105 [place], [place] [address] [place], [place] [facility] (continued) [address] [place] [address], [place] [address], [place] [address], [place], [place] [address] [place] [address] [place], [place] [address], [employer] 6310 [place], [place] [address] [place] [place] [address] [place], [place] [facility] [address], [place] [address],  [address], [place] [address] [place], [place] [address] [place], [place] 317 [place] 3501 [place],  [address] [place], [place] 2416 [place], [place] [address]10 [address]  [place], [place]  [address] [place], [place] [address], [place]  3703 [place], [place] [address], [place] [address] [facility] [address]  [place] [address] [place], [place] [address], [employer] [address], [employer] (continued) [address], [employer]    Final next level of care: Homeless Shelter Barriers to Discharge: No Barriers Identified   Patient Goals and CMS Choice CMS Medicare.gov Compare Post Acute Care list provided to:: Patient    Discharge Placement                         Discharge Plan and Services Additional resources added to the After Visit Summary for     Discharge Planning Services: CM Consult                                 Social Determinants of Health (SDOH) Interventions SDOH Screenings   Food Insecurity: Food Insecurity Present (11/11/2022)  Housing: High Risk (11/11/2022)  Transportation Needs: Unmet Transportation Needs (11/11/2022)  Utilities: At Risk (11/11/2022)  Tobacco Use: High Risk (11/12/2022)     Readmission Risk Interventions     No data to display

## 2022-11-14 NOTE — Progress Notes (Signed)
AVS reviewed w/ pt  who verbalized an understanding. PIV removed as documented. Pt will eat lunch at 12 noon and then go to the D/C lounge to wait for a ride- pt also given a bus pass. Pt asked if she could have one in case her ride does not show up. Pt states she can not pay for pantoprazole. This RN states she will reach out to the [facility] team.

## 2022-11-14 NOTE — Plan of Care (Signed)
  Problem: Pain Managment: Goal: General experience of comfort will improve Outcome: Progressing   

## 2022-11-14 NOTE — Plan of Care (Signed)

## 2022-11-14 NOTE — Progress Notes (Signed)
Patient's meds delivered to bedside. Patient discharged via transportation she set up for herself.

## 2022-11-17 ENCOUNTER — Encounter (HOSPITAL_COMMUNITY): Payer: Self-pay | Admitting: Gastroenterology

## 2022-12-11 ENCOUNTER — Encounter (HOSPITAL_COMMUNITY): Payer: Self-pay | Admitting: Emergency Medicine

## 2022-12-11 ENCOUNTER — Inpatient Hospital Stay (HOSPITAL_COMMUNITY)
Admission: EM | Admit: 2022-12-11 | Discharge: 2022-12-13 | DRG: 378 | Disposition: A | Discharge status: Home / Self Care | Payer: Medicaid Other | Attending: Internal Medicine | Admitting: Internal Medicine

## 2022-12-11 ENCOUNTER — Emergency Department (HOSPITAL_COMMUNITY): Payer: Medicaid Other

## 2022-12-11 ENCOUNTER — Other Ambulatory Visit: Payer: Self-pay

## 2022-12-11 DIAGNOSIS — I10 Essential (primary) hypertension: Secondary | ICD-10-CM | POA: Diagnosis present

## 2022-12-11 DIAGNOSIS — Z720 Tobacco use: Secondary | ICD-10-CM | POA: Diagnosis present

## 2022-12-11 DIAGNOSIS — F1490 Cocaine use, unspecified, uncomplicated: Secondary | ICD-10-CM | POA: Diagnosis present

## 2022-12-11 DIAGNOSIS — F419 Anxiety disorder, unspecified: Secondary | ICD-10-CM | POA: Diagnosis present

## 2022-12-11 DIAGNOSIS — Z7151 Drug abuse counseling and surveillance of drug abuser: Secondary | ICD-10-CM | POA: Diagnosis not present

## 2022-12-11 DIAGNOSIS — Z716 Tobacco abuse counseling: Secondary | ICD-10-CM | POA: Diagnosis not present

## 2022-12-11 DIAGNOSIS — T39395A Adverse effect of other nonsteroidal anti-inflammatory drugs [NSAID], initial encounter: Secondary | ICD-10-CM | POA: Diagnosis present

## 2022-12-11 DIAGNOSIS — Z5900 Homelessness unspecified: Secondary | ICD-10-CM | POA: Diagnosis not present

## 2022-12-11 DIAGNOSIS — T39015A Adverse effect of aspirin, initial encounter: Secondary | ICD-10-CM | POA: Diagnosis present

## 2022-12-11 DIAGNOSIS — K254 Chronic or unspecified gastric ulcer with hemorrhage: Secondary | ICD-10-CM | POA: Diagnosis present

## 2022-12-11 DIAGNOSIS — R1013 Epigastric pain: Secondary | ICD-10-CM | POA: Diagnosis present

## 2022-12-11 DIAGNOSIS — D75839 Thrombocytosis, unspecified: Secondary | ICD-10-CM | POA: Diagnosis present

## 2022-12-11 DIAGNOSIS — F1910 Other psychoactive substance abuse, uncomplicated: Secondary | ICD-10-CM | POA: Diagnosis present

## 2022-12-11 DIAGNOSIS — Z91148 Patient's other noncompliance with medication regimen for other reason: Secondary | ICD-10-CM | POA: Diagnosis not present

## 2022-12-11 DIAGNOSIS — Z79899 Other long term (current) drug therapy: Secondary | ICD-10-CM

## 2022-12-11 DIAGNOSIS — K219 Gastro-esophageal reflux disease without esophagitis: Secondary | ICD-10-CM | POA: Diagnosis present

## 2022-12-11 DIAGNOSIS — Z8249 Family history of ischemic heart disease and other diseases of the circulatory system: Secondary | ICD-10-CM | POA: Diagnosis not present

## 2022-12-11 DIAGNOSIS — Z885 Allergy status to narcotic agent status: Secondary | ICD-10-CM | POA: Diagnosis not present

## 2022-12-11 DIAGNOSIS — E440 Moderate protein-calorie malnutrition: Secondary | ICD-10-CM | POA: Diagnosis present

## 2022-12-11 DIAGNOSIS — D500 Iron deficiency anemia secondary to blood loss (chronic): Secondary | ICD-10-CM | POA: Diagnosis present

## 2022-12-11 DIAGNOSIS — E669 Obesity, unspecified: Secondary | ICD-10-CM | POA: Diagnosis present

## 2022-12-11 DIAGNOSIS — Z9049 Acquired absence of other specified parts of digestive tract: Secondary | ICD-10-CM

## 2022-12-11 DIAGNOSIS — G8929 Other chronic pain: Secondary | ICD-10-CM | POA: Diagnosis present

## 2022-12-11 DIAGNOSIS — K922 Gastrointestinal hemorrhage, unspecified: Secondary | ICD-10-CM | POA: Diagnosis not present

## 2022-12-11 DIAGNOSIS — F329 Major depressive disorder, single episode, unspecified: Secondary | ICD-10-CM | POA: Diagnosis present

## 2022-12-11 DIAGNOSIS — F17210 Nicotine dependence, cigarettes, uncomplicated: Secondary | ICD-10-CM | POA: Diagnosis present

## 2022-12-11 DIAGNOSIS — E876 Hypokalemia: Secondary | ICD-10-CM | POA: Diagnosis present

## 2022-12-11 DIAGNOSIS — Z681 Body mass index (BMI) 19 or less, adult: Secondary | ICD-10-CM

## 2022-12-11 DIAGNOSIS — F1410 Cocaine abuse, uncomplicated: Secondary | ICD-10-CM | POA: Diagnosis present

## 2022-12-11 DIAGNOSIS — K439 Ventral hernia without obstruction or gangrene: Secondary | ICD-10-CM | POA: Diagnosis present

## 2022-12-11 DIAGNOSIS — K920 Hematemesis: Secondary | ICD-10-CM

## 2022-12-11 LAB — URINALYSIS, ROUTINE W REFLEX MICROSCOPIC
Bilirubin Urine: NEGATIVE
Glucose, UA: NEGATIVE mg/dL
Hgb urine dipstick: NEGATIVE
Ketones, ur: NEGATIVE mg/dL
Leukocytes,Ua: NEGATIVE
Nitrite: NEGATIVE
Protein, ur: NEGATIVE mg/dL
Specific Gravity, Urine: 1.021 (ref 1.005–1.030)
pH: 7 (ref 5.0–8.0)

## 2022-12-11 LAB — CBC WITH DIFFERENTIAL/PLATELET
Abs Immature Granulocytes: 0.04 10*3/uL (ref 0.00–0.07)
Basophils Absolute: 0.1 10*3/uL (ref 0.0–0.1)
Basophils Relative: 1 %
Eosinophils Absolute: 0.1 10*3/uL (ref 0.0–0.5)
Eosinophils Relative: 1 %
HCT: 25.2 % — ABNORMAL LOW (ref 36.0–46.0)
Hemoglobin: 7.8 g/dL — ABNORMAL LOW (ref 12.0–15.0)
Immature Granulocytes: 0 %
Lymphocytes Relative: 11 %
Lymphs Abs: 1.1 10*3/uL (ref 0.7–4.0)
MCH: 24.5 pg — ABNORMAL LOW (ref 26.0–34.0)
MCHC: 31 g/dL (ref 30.0–36.0)
MCV: 79.2 fL — ABNORMAL LOW (ref 80.0–100.0)
Monocytes Absolute: 0.6 10*3/uL (ref 0.1–1.0)
Monocytes Relative: 7 %
Neutro Abs: 7.7 10*3/uL (ref 1.7–7.7)
Neutrophils Relative %: 80 %
Platelets: 756 10*3/uL — ABNORMAL HIGH (ref 150–400)
RBC: 3.18 MIL/uL — ABNORMAL LOW (ref 3.87–5.11)
RDW: 15.9 % — ABNORMAL HIGH (ref 11.5–15.5)
WBC: 9.6 10*3/uL (ref 4.0–10.5)
nRBC: 0 % (ref 0.0–0.2)

## 2022-12-11 LAB — COMPREHENSIVE METABOLIC PANEL
ALT: 9 U/L (ref 0–44)
AST: 15 U/L (ref 15–41)
Albumin: 3.1 g/dL — ABNORMAL LOW (ref 3.5–5.0)
Alkaline Phosphatase: 62 U/L (ref 38–126)
Anion gap: 7 (ref 5–15)
BUN: 16 mg/dL (ref 6–20)
CO2: 28 mmol/L (ref 22–32)
Calcium: 8.5 mg/dL — ABNORMAL LOW (ref 8.9–10.3)
Chloride: 97 mmol/L — ABNORMAL LOW (ref 98–111)
Creatinine, Ser: 0.57 mg/dL (ref 0.44–1.00)
GFR, Estimated: >60 mL/min (ref >60)
Glucose, Bld: 106 mg/dL — ABNORMAL HIGH (ref 70–99)
Potassium: 3 mmol/L — ABNORMAL LOW (ref 3.5–5.1)
Sodium: 132 mmol/L — ABNORMAL LOW (ref 135–145)
Total Bilirubin: 0.2 mg/dL — ABNORMAL LOW (ref 0.3–1.2)
Total Protein: 8.2 g/dL — ABNORMAL HIGH (ref 6.5–8.1)

## 2022-12-11 LAB — HCG, SERUM, QUALITATIVE: Preg, Serum: NEGATIVE

## 2022-12-11 LAB — TROPONIN I (HIGH SENSITIVITY)
Troponin I (High Sensitivity): 5 ng/L (ref <18)
Troponin I (High Sensitivity): 7 ng/L (ref <18)

## 2022-12-11 LAB — LIPASE, BLOOD: Lipase: 34 U/L (ref 11–51)

## 2022-12-11 MED ORDER — POTASSIUM CHLORIDE CRYS ER 20 MEQ PO TBCR
40.0000 meq | EXTENDED_RELEASE_TABLET | Freq: Once | ORAL | Status: AC
  Administered 2022-12-11: 40 meq via ORAL
  Filled 2022-12-11: qty 2

## 2022-12-11 MED ORDER — ACETAMINOPHEN 325 MG PO TABS
650.0000 mg | ORAL_TABLET | Freq: Four times a day (QID) | ORAL | Status: DC | PRN
  Administered 2022-12-11: 650 mg via ORAL
  Filled 2022-12-11: qty 2

## 2022-12-11 MED ORDER — DOCUSATE SODIUM 100 MG PO CAPS
100.0000 mg | ORAL_CAPSULE | Freq: Two times a day (BID) | ORAL | Status: DC
  Administered 2022-12-11 – 2022-12-13 (×5): 100 mg via ORAL
  Filled 2022-12-11 (×4): qty 1

## 2022-12-11 MED ORDER — ONDANSETRON HCL 4 MG PO TABS: 4.0000 mg | ORAL_TABLET | Freq: Four times a day (QID) | ORAL | Status: DC | PRN

## 2022-12-11 MED ORDER — IOHEXOL 300 MG/ML  SOLN
100.0000 mL | Freq: Once | INTRAMUSCULAR | Status: AC | PRN
  Administered 2022-12-11: 75 mL via INTRAVENOUS

## 2022-12-11 MED ORDER — SODIUM CHLORIDE 0.9 % IV BOLUS
1000.0000 mL | Freq: Once | INTRAVENOUS | Status: AC
  Administered 2022-12-11: 1000 mL via INTRAVENOUS

## 2022-12-11 MED ORDER — HYDROMORPHONE HCL 1 MG/ML IJ SOLN
0.5000 mg | Freq: Four times a day (QID) | INTRAMUSCULAR | Status: DC | PRN
  Administered 2022-12-11 – 2022-12-13 (×8): 0.5 mg via INTRAVENOUS
  Filled 2022-12-11: qty 1
  Filled 2022-12-11 (×6): qty 0.5
  Filled 2022-12-11: qty 1

## 2022-12-11 MED ORDER — NICOTINE 14 MG/24HR TD PT24
14.0000 mg | MEDICATED_PATCH | Freq: Every day | TRANSDERMAL | Status: DC
  Administered 2022-12-11 – 2022-12-13 (×3): 14 mg via TRANSDERMAL
  Filled 2022-12-11 (×3): qty 1

## 2022-12-11 MED ORDER — SODIUM CHLORIDE 0.9 % IV SOLN: INTRAVENOUS | Status: DC

## 2022-12-11 MED ORDER — PANTOPRAZOLE SODIUM 40 MG IV SOLR
40.0000 mg | Freq: Two times a day (BID) | INTRAVENOUS | Status: DC
  Administered 2022-12-11 – 2022-12-13 (×4): 40 mg via INTRAVENOUS
  Filled 2022-12-11 (×4): qty 10

## 2022-12-11 MED ORDER — SODIUM CHLORIDE (PF) 0.9 % IJ SOLN
INTRAMUSCULAR | Status: AC
  Filled 2022-12-11: qty 50

## 2022-12-11 MED ORDER — ACETAMINOPHEN 650 MG RE SUPP: 650.0000 mg | Freq: Four times a day (QID) | RECTAL | Status: DC | PRN

## 2022-12-11 MED ORDER — MORPHINE SULFATE (PF) 4 MG/ML IV SOLN
4.0000 mg | Freq: Once | INTRAVENOUS | Status: AC
  Administered 2022-12-11: 4 mg via INTRAVENOUS
  Filled 2022-12-11: qty 1

## 2022-12-11 MED ORDER — HYDRALAZINE HCL 20 MG/ML IJ SOLN
10.0000 mg | Freq: Three times a day (TID) | INTRAMUSCULAR | Status: DC | PRN
  Administered 2022-12-11: 10 mg via INTRAVENOUS
  Filled 2022-12-11: qty 1

## 2022-12-11 MED ORDER — ONDANSETRON HCL 4 MG/2ML IJ SOLN
4.0000 mg | Freq: Once | INTRAMUSCULAR | Status: AC
  Administered 2022-12-11: 4 mg via INTRAVENOUS
  Filled 2022-12-11: qty 2

## 2022-12-11 MED ORDER — ONDANSETRON HCL 4 MG/2ML IJ SOLN
4.0000 mg | Freq: Four times a day (QID) | INTRAMUSCULAR | Status: DC | PRN
  Administered 2022-12-12: 4 mg via INTRAVENOUS
  Filled 2022-12-11: qty 2

## 2022-12-11 MED ORDER — HYDROMORPHONE HCL 1 MG/ML IJ SOLN
1.0000 mg | Freq: Once | INTRAMUSCULAR | Status: AC
  Administered 2022-12-11: 1 mg via INTRAVENOUS
  Filled 2022-12-11: qty 1

## 2022-12-11 MED ORDER — PANTOPRAZOLE SODIUM 40 MG IV SOLR
40.0000 mg | Freq: Once | INTRAVENOUS | Status: AC
  Administered 2022-12-11: 40 mg via INTRAVENOUS
  Filled 2022-12-11: qty 10

## 2022-12-11 NOTE — H&P (Signed)
History and Physical    [name] MVH:[id number] DOB: 1975-09-09 DOA: 12/11/2022  PCP: Patient, No Pcp Per   Patient coming from: Home  I have personally briefly reviewed patient's old medical records in [facility]  Chief Complaint:  Bloody vomiting twice at home.  HPI: [name] is 47 y.o. female with PMH significant for polysubstance abuse, major depression, esophagitis, cocaine abuse, chronic anemia presented to the ED with complaints of throwing up blood twice and chronic abdominal pain.  Patient was recently admitted in [facility] from 11/11/2022 to 11/14/2022 for upper GI bleeding and abdominal pain.  Hemoglobin was stable.  Patient underwent EGD found to have grade A esophagitis, 2 nonbleeding cratered gastric ulcers , acute gastritis.  She was treated with IV pantoprazole and subsequently discharged on oral PPI.  Patient reports she has not been taking pantoprazole , She is homeless and she continued to use cocaine,  THC.  Last cocaine use was 2 days ago.  Patient states she started throwing up blood yesterday and has worsening of abdominal pain so she presented in the ED.  She also reports some intermittent chest pain associated with shortness of breath with exertion.  ED Course: She is hemodynamically stable except tachypnea and hypertension. Temp 97.9, HR 95, RR 22, BP 161/111, SpO2 100% on room air Labs include sodium 132, potassium 3.0, chloride 97, bicarb 28, glucose 106, BUN 16, creatinine 0.57, calcium 8.7, alkaline phosphatase 62, albumin 3.1, lipase 34, AST 15, ALT 9, total protein 8.2, troponin 5, 7, WBC 9.6, hemoglobin 7.8, hematocrit 25.2, MCV 79.2, platelets 756, UA is unremarkable, chest x-ray unremarkable , CT abdomen and pelvis ;No radiographic signs of acute pancreatitis or other acute findings.  Stable right adnexal mass.  Review of Systems:  Review of Systems  Constitutional:  Positive for malaise/fatigue.  HENT: Negative.    Eyes: Negative.    Respiratory:  Positive for shortness of breath.   Cardiovascular:  Positive for chest pain.  Gastrointestinal:  Positive for abdominal pain, heartburn, nausea and vomiting.  Genitourinary: Negative.   Musculoskeletal: Negative.   Skin: Negative.   Neurological: Negative.   Endo/Heme/Allergies: Negative.   Psychiatric/Behavioral:  Positive for depression.     Past Medical History:  Diagnosis Date   Anemia 05/2015   due to blood loss from duodenal ulcer.  transfused 3 PRBCs.    Chronic abdominal pain 2008   ? IBS.  multiple CT scans: enlarged uterus.   Depression    with mood swings   Duodenal ulcer 05/2015   Esophagitis 2011   GERD (gastroesophageal reflux disease)    Hypertension    Obesity 2011   Polysubstance abuse (HCC) 09/28/2010   cocaine, opiates, THC, tobacco.       Past Surgical History:  Procedure Laterality Date   BIOPSY  09/08/2022   Procedure: BIOPSY;  Surgeon: Kerin Salen, MD;  Location: [facility];  Service: Gastroenterology;;   BIOPSY  11/12/2022   Procedure: BIOPSY;  Surgeon: Charlott Rakes, MD;  Location: [facility];  Service: Gastroenterology;;   CHOLECYSTECTOMY  2004   ESOPHAGOGASTRODUODENOSCOPY N/A 06/04/2015   Procedure: ESOPHAGOGASTRODUODENOSCOPY (EGD);  Surgeon: Meryl Dare, MD;  Location: [facility];  Service: Endoscopy;  Laterality: N/A;   ESOPHAGOGASTRODUODENOSCOPY N/A 11/12/2022   Procedure: ESOPHAGOGASTRODUODENOSCOPY (EGD);  Surgeon: Charlott Rakes, MD;  Location: [facility];  Service: Gastroenterology;  Laterality: N/A;   ESOPHAGOGASTRODUODENOSCOPY (EGD) WITH PROPOFOL N/A 09/08/2022   Procedure: ESOPHAGOGASTRODUODENOSCOPY (EGD) WITH PROPOFOL;  Surgeon: Kerin Salen, MD;  Location: [facility]  ENDOSCOPY;  Service: Gastroenterology;  Laterality: N/A;   LAPAROSCOPIC APPENDECTOMY  11/2006   Dr Abbey Chatters   TONSILLECTOMY     TUBAL LIGATION       reports that she has been smoking cigarettes. She has never used smokeless tobacco. She reports that she  does not drink alcohol and does not use drugs.  Allergies  Allergen Reactions   Hydrocodone Nausea And Vomiting    Family History  Problem Relation Age of Onset   Hypertension Mother    Heart disease Father 68       MI   Stroke Father 58   Diabetes Father    GER disease Sister    Cancer Maternal Grandmother        uterine and stomach    Family history reviewed and not pertinent.  Prior to Admission medications   Medication Sig Start Date End Date Taking? Authorizing Provider  acetaminophen (TYLENOL) 500 MG tablet Take 500-1,000 mg by mouth every 6 (six) hours as needed for moderate pain.    [provider]  Multiple Vitamins-Minerals (MULTIVITAL PO) Take 1 tablet by mouth daily.    [provider]  pantoprazole (PROTONIX) 40 MG tablet Take 1 tablet (40 mg total) by mouth 2 (two) times daily before a meal. 11/14/22 02/12/23  Alwyn Ren, MD    Physical Exam: Vitals:   12/11/22 0602 12/11/22 0652 12/11/22 0919  BP: (!) 161/111 (!) 148/114 (!) 170/61  Pulse: 95 80 70  Resp: (!) 22 (!) 32 (!) 24  Temp: 97.9 F (36.6 C)  97.9 F (36.6 C)  TempSrc: Oral  Oral  SpO2: 100% 100% 100%    Constitutional: Appears chronically ill looking, thin built, deconditioned. Vitals:   12/11/22 0602 12/11/22 0652 12/11/22 0919  BP: (!) 161/111 (!) 148/114 (!) 170/61  Pulse: 95 80 70  Resp: (!) 22 (!) 32 (!) 24  Temp: 97.9 F (36.6 C)  97.9 F (36.6 C)  TempSrc: Oral  Oral  SpO2: 100% 100% 100%   Eyes: PERRL, lids and conjunctivae normal ENMT: Mucous membranes are moist. Posterior pharynx clear of any exudate or lesions.Normal dentition.  Neck: normal, supple, no masses, no thyromegaly Respiratory: Clear to auscultation bilaterally, no wheezing, no crackles. Normal respiratory effort. No accessory muscle use.  Cardiovascular: S1 S2 heard, Regular rate and rhythm, no murmurs / rubs / gallops. No extremity edema. Abdomen: Soft, Mild  tenderness noted, no masses  palpated. No hepatosplenomegaly. Bowel sounds positive.  Musculoskeletal: No joint deformity upper and lower extremities. Good ROM, no contractures. Normal muscle tone.  Skin: no rashes, lesions, ulcers. No induration Neurologic: CN 2-12 grossly intact. Sensation intact, DTR normal. Strength 5/5 in all 4.  Psychiatric: Normal judgment and insight. Alert and oriented x 3. Normal mood.    Labs on Admission: I have personally reviewed following labs and imaging studies  CBC: Recent Labs  Lab 12/11/22 0651  WBC 9.6  NEUTROABS 7.7  HGB 7.8*  HCT 25.2*  MCV 79.2*  PLT 756*   Basic Metabolic Panel: Recent Labs  Lab 12/11/22 0651  NA 132*  K 3.0*  CL 97*  CO2 28  GLUCOSE 106*  BUN 16  CREATININE 0.57  CALCIUM 8.5*   GFR: CrCl cannot be calculated (Unknown ideal weight.). Liver Function Tests: Recent Labs  Lab 12/11/22 0651  AST 15  ALT 9  ALKPHOS 62  BILITOT 0.2*  PROT 8.2*  ALBUMIN 3.1*   Recent Labs  Lab 12/11/22 0651  LIPASE 34  No results for input(s): "AMMONIA" in the last 168 hours. Coagulation Profile: No results for input(s): "INR", "PROTIME" in the last 168 hours. Cardiac Enzymes: No results for input(s): "CKTOTAL", "CKMB", "CKMBINDEX", "TROPONINI" in the last 168 hours. BNP (last 3 results) No results for input(s): "PROBNP" in the last 8760 hours. HbA1C: No results for input(s): "HGBA1C" in the last 72 hours. CBG: No results for input(s): "GLUCAP" in the last 168 hours. Lipid Profile: No results for input(s): "CHOL", "HDL", "LDLCALC", "TRIG", "CHOLHDL", "LDLDIRECT" in the last 72 hours. Thyroid Function Tests: No results for input(s): "TSH", "T4TOTAL", "FREET4", "T3FREE", "THYROIDAB" in the last 72 hours. Anemia Panel: No results for input(s): "VITAMINB12", "FOLATE", "FERRITIN", "TIBC", "IRON", "RETICCTPCT" in the last 72 hours. Urine analysis:    Component Value Date/Time   COLORURINE YELLOW 12/11/2022 0853   APPEARANCEUR CLOUDY (A)  12/11/2022 0853   LABSPEC 1.021 12/11/2022 0853   PHURINE 7.0 12/11/2022 0853   GLUCOSEU NEGATIVE 12/11/2022 0853   HGBUR NEGATIVE 12/11/2022 0853   BILIRUBINUR NEGATIVE 12/11/2022 0853   KETONESUR NEGATIVE 12/11/2022 0853   PROTEINUR NEGATIVE 12/11/2022 0853   UROBILINOGEN 0.2 12/21/2014 2007   NITRITE NEGATIVE 12/11/2022 0853   LEUKOCYTESUR NEGATIVE 12/11/2022 0853    Radiological Exams on Admission: CT ABDOMEN PELVIS W CONTRAST  Result Date: 12/11/2022 CLINICAL DATA:  Abdominal pain. Acute pancreatitis. Right adnexal mass. EXAM: CT ABDOMEN AND PELVIS WITH CONTRAST TECHNIQUE: Multidetector CT imaging of the abdomen and pelvis was performed using the standard protocol following bolus administration of intravenous contrast. RADIATION DOSE REDUCTION: This exam was performed according to the departmental dose-optimization program which includes automated exposure control, adjustment of the mA and/or kV according to patient size and/or use of iterative reconstruction technique. CONTRAST:  75mL OMNIPAQUE IOHEXOL 300 MG/ML  SOLN COMPARISON:  11/11/2022 FINDINGS: Lower Chest: No acute findings. Hepatobiliary: Mild-to-moderate diffuse hepatic steatosis again noted. No suspicious hepatic masses identified. Prior cholecystectomy. No evidence of biliary obstruction. Pancreas: No pancreatic mass or peripancreatic inflammatory changes identified. Mild diffuse pancreatic ductal dilatation remains unchanged compared to prior study. No peripancreatic fluid collections identified. Spleen: Within normal limits in size and appearance. Adrenals/Urinary Tract: No suspicious masses identified. No evidence of ureteral calculi or hydronephrosis. Unremarkable unopacified urinary bladder. A horseshoe shaped cystic area is again seen surrounding the urethra, measuring 3.1 x 1.6 cm. This shows no significant change in his most consistent with a urethral diverticulum. Stomach/Bowel: No evidence of obstruction, inflammatory  process or abnormal fluid collections. Vascular/Lymphatic: No pathologically enlarged lymph nodes. No acute vascular findings. Reproductive: Few small fibroids are seen in the posterior lower uterine segment. A right adnexal mass is again seen which measures 4.9 x 3.7 cm and abuts the right lateral uterine wall. This remains stable in size, and likely represents pedunculated fibroid, although ovarian mass cannot definitely be excluded on this exam. No No evidence of inflammatory changes or ascites. Other:  Stable small umbilical hernia, which contains only fat. Musculoskeletal:  No suspicious bone lesions identified. IMPRESSION: No radiographic signs of acute pancreatitis or other acute findings. Stable 4.9 cm right adnexal mass, which likely represents pedunculated fibroid, although ovarian mass cannot definitely be excluded on this exam. Recommend further evaluation with pelvic MRI without and with contrast. Stable small uterine fibroids. Stable large urethral diverticulum. Stable hepatic steatosis. Stable small umbilical hernia, which contains only fat. Electronically Signed   By: Danae Orleans M.D.   On: 12/11/2022 10:39   DG Chest Portable 1 View  Result Date: 12/11/2022 CLINICAL DATA:  Shortness  of breath, vomiting, concern for GI bleed EXAM: PORTABLE CHEST 1 VIEW COMPARISON:  None Available. FINDINGS: The heart size and mediastinal contours are within normal limits. Both lungs are clear. The visualized skeletal structures are unremarkable. IMPRESSION: No active disease. Electronically Signed   By: Judie Petit.  Shick M.D.   On: 12/11/2022 08:29    EKG: EKG ordered.  Please review  Assessment/Plan Principal Problem:   Acute upper GI bleeding Active Problems:   GERD (gastroesophageal reflux disease)   Anxiety   Hypertension   Tobacco abuse   Obesity   Polysubstance abuse (HCC)   Cocaine abuse (HCC)  Acute upper GI bleed:  Patient presented with hematemesis associated with chronic abdominal pain. She  is hemodynamically stable, Hb 7.8.at admission. Hb on discharge 7.7  on 11/14/22 Recent hospitalization for similar complaint,  underwent EGD found to have nonbleeding gastric ulcers. Patient not taking pantoprazole at home. She continued to use cocaine and THC. Continue IV fluids resuscitation Continue IV Protonix 40 mg IV every 12 hours. Monitor H&H q 12hr Transfuse if hemoglobin drops below 7. GI is consulted.  Chronic abdominal pain: She reports chronic intermittent abdominal pain.   CT abdomen and pelvis negative for acute abnormality. Likely in the setting of ongoing substance abuse. Continue Dilaudid as needed for pain control  Hypertension: Patient is not on any oral antihypertensive medication Start hydralazine 10 mg IV every 6 as needed for SBP above 170  Tobacco abuse: Counseled on quit smoking. Nicotine patch.  Polysubstance abuse: Patient reports she is under a lot of stress.  She is homeless.   Counseled to refrain from using substance abuse. TOC consult.  Cocaine abuse: Counseled to refrain from using cocaine.  Hypokalemia:  Replaced.  Continue to monitor  DVT prophylaxis: SCDs Code Status: Full code Family Communication: No family at bed side. Disposition Plan:    Status is: Inpatient Remains inpatient appropriate because: Admitted for hematemesis and chronic abdominal pain.  Hemoglobin is stable, hemodynamically stable.  GI is consulted.   Consults called: Gastroenterology Admission status: In Patient   Willeen Niece MD [facility]   If 7PM-7AM, please contact night-coverage [email or web address] Password [email or web address]  12/11/2022, 11:41 AM

## 2022-12-11 NOTE — ED Triage Notes (Addendum)
Pt was dropped off by friend stating he had to go to work. Hx of bleeding ulcers but not recently. She states she is vomiting blood again. RN nor friend witnessed any vomiting. Writhing in car and crying upon getting out of car and registration and triage process. Hx of substance abuse. Was recently admitted and had EGD showing ulcers- nonbleeding.  Pt too hysterical for labs

## 2022-12-11 NOTE — ED Provider Notes (Signed)
[place] EMERGENCY DEPARTMENT AT [facility] Provider Note   CSN: [id number] Arrival date & time: 12/11/22  0559     History  Chief Complaint  Patient presents with   Abdominal Pain    [name] is a 47 y.o. female with a past medical history significant for polysubstance abuse and prior upper GI bleed from gastric/duodenal ulcer who presents to the ED due to hematemesis and abdominal pain.  Patient states hematemesis started yesterday.  She admits to 2 episodes of bloody emesis.  Notes she is vomiting clots of blood. Not on any blood thinners.  Patient recently admitted for the same thing on 9/2 to 9/5 where an upper endoscopy on 9/3 showed grade A esophagitis, 2 nonbleeding cratered gastric ulcers, acute gastritis, and significant food residue.  Also admits to some chest pain and shortness of breath.  Not currently having chest pain.  Had chest pain earlier this morning.  Admits to using cocaine yesterday.  Also endorses some shortness of breath with exertion.  History obtained from patient and past medical records. No interpreter used during encounter.       Home Medications Prior to Admission medications   Medication Sig Start Date End Date Taking? Authorizing Provider  acetaminophen (TYLENOL) 500 MG tablet Take 500-1,000 mg by mouth every 6 (six) hours as needed for moderate pain.    [provider]  Multiple Vitamins-Minerals (MULTIVITAL PO) Take 1 tablet by mouth daily.    [provider]  pantoprazole (PROTONIX) 40 MG tablet Take 1 tablet (40 mg total) by mouth 2 (two) times daily before a meal. 11/14/22 02/12/23  Alwyn Ren, MD      Allergies    Hydrocodone    Review of Systems   Review of Systems  Constitutional:  Negative for fever.  Respiratory:  Positive for shortness of breath.   Cardiovascular:  Positive for chest pain.  Gastrointestinal:  Positive for abdominal pain, nausea and vomiting.    Physical Exam Updated  Vital Signs BP (!) 170/61   Pulse 70   Temp 97.9 F (36.6 C) (Oral)   Resp (!) 24   LMP 11/06/2022 (Approximate) Comment: Negative HCG blood serum 12-11-2022        patient states no chance of pregnancy, pregnancy test waiver signed=11/11/22  SpO2 100%  Physical Exam Vitals and nursing note reviewed.  Constitutional:      General: She is not in acute distress.    Appearance: She is ill-appearing.  HENT:     Head: Normocephalic.  Eyes:     Pupils: Pupils are equal, round, and reactive to light.  Cardiovascular:     Rate and Rhythm: Normal rate and regular rhythm.     Pulses: Normal pulses.     Heart sounds: Normal heart sounds. No murmur heard.    No friction rub. No gallop.  Pulmonary:     Effort: Pulmonary effort is normal.     Breath sounds: Normal breath sounds.  Abdominal:     General: Abdomen is flat. There is no distension.     Palpations: Abdomen is soft.     Tenderness: There is abdominal tenderness. There is no guarding or rebound.     Comments: Diffuse tenderness, most significant in upper quadrants. Ventral hernia that is easily reducible.   Musculoskeletal:        General: Normal range of motion.     Cervical back: Neck supple.  Skin:    General: Skin is warm and dry.  Neurological:     General: No focal deficit present.     Mental Status: She is alert.  Psychiatric:        Mood and Affect: Mood normal.        Behavior: Behavior normal.     ED Results / Procedures / Treatments   Labs (all labs ordered are listed, but only abnormal results are displayed) Labs Reviewed  URINALYSIS, ROUTINE W REFLEX MICROSCOPIC - Abnormal; Notable for the following components:      Result Value   APPearance CLOUDY (*)    All other components within normal limits  CBC WITH DIFFERENTIAL/PLATELET - Abnormal; Notable for the following components:   RBC 3.18 (*)    Hemoglobin 7.8 (*)    HCT 25.2 (*)    MCV 79.2 (*)    MCH 24.5 (*)    RDW 15.9 (*)    Platelets 756 (*)     All other components within normal limits  COMPREHENSIVE METABOLIC PANEL - Abnormal; Notable for the following components:   Sodium 132 (*)    Potassium 3.0 (*)    Chloride 97 (*)    Glucose, Bld 106 (*)    Calcium 8.5 (*)    Total Protein 8.2 (*)    Albumin 3.1 (*)    Total Bilirubin 0.2 (*)    All other components within normal limits  LIPASE, BLOOD  HCG, SERUM, QUALITATIVE  TROPONIN I (HIGH SENSITIVITY)  TROPONIN I (HIGH SENSITIVITY)    EKG None  Radiology CT ABDOMEN PELVIS W CONTRAST  Result Date: 12/11/2022 CLINICAL DATA:  Abdominal pain. Acute pancreatitis. Right adnexal mass. EXAM: CT ABDOMEN AND PELVIS WITH CONTRAST TECHNIQUE: Multidetector CT imaging of the abdomen and pelvis was performed using the standard protocol following bolus administration of intravenous contrast. RADIATION DOSE REDUCTION: This exam was performed according to the departmental dose-optimization program which includes automated exposure control, adjustment of the mA and/or kV according to patient size and/or use of iterative reconstruction technique. CONTRAST:  75mL OMNIPAQUE IOHEXOL 300 MG/ML  SOLN COMPARISON:  11/11/2022 FINDINGS: Lower Chest: No acute findings. Hepatobiliary: Mild-to-moderate diffuse hepatic steatosis again noted. No suspicious hepatic masses identified. Prior cholecystectomy. No evidence of biliary obstruction. Pancreas: No pancreatic mass or peripancreatic inflammatory changes identified. Mild diffuse pancreatic ductal dilatation remains unchanged compared to prior study. No peripancreatic fluid collections identified. Spleen: Within normal limits in size and appearance. Adrenals/Urinary Tract: No suspicious masses identified. No evidence of ureteral calculi or hydronephrosis. Unremarkable unopacified urinary bladder. A horseshoe shaped cystic area is again seen surrounding the urethra, measuring 3.1 x 1.6 cm. This shows no significant change in his most consistent with a urethral  diverticulum. Stomach/Bowel: No evidence of obstruction, inflammatory process or abnormal fluid collections. Vascular/Lymphatic: No pathologically enlarged lymph nodes. No acute vascular findings. Reproductive: Few small fibroids are seen in the posterior lower uterine segment. A right adnexal mass is again seen which measures 4.9 x 3.7 cm and abuts the right lateral uterine wall. This remains stable in size, and likely represents pedunculated fibroid, although ovarian mass cannot definitely be excluded on this exam. No No evidence of inflammatory changes or ascites. Other:  Stable small umbilical hernia, which contains only fat. Musculoskeletal:  No suspicious bone lesions identified. IMPRESSION: No radiographic signs of acute pancreatitis or other acute findings. Stable 4.9 cm right adnexal mass, which likely represents pedunculated fibroid, although ovarian mass cannot definitely be excluded on this exam. Recommend further evaluation with pelvic MRI without and with contrast. Stable small uterine  fibroids. Stable large urethral diverticulum. Stable hepatic steatosis. Stable small umbilical hernia, which contains only fat. Electronically Signed   By: Danae Orleans M.D.   On: 12/11/2022 10:39   DG Chest Portable 1 View  Result Date: 12/11/2022 CLINICAL DATA:  Shortness of breath, vomiting, concern for GI bleed EXAM: PORTABLE CHEST 1 VIEW COMPARISON:  None Available. FINDINGS: The heart size and mediastinal contours are within normal limits. Both lungs are clear. The visualized skeletal structures are unremarkable. IMPRESSION: No active disease. Electronically Signed   By: Judie Petit.  Shick M.D.   On: 12/11/2022 08:29    Procedures Procedures    Medications Ordered in ED Medications  potassium chloride SA (KLOR-CON M) CR tablet 40 mEq (has no administration in time range)  sodium chloride 0.9 % bolus 1,000 mL (1,000 mLs Intravenous New Bag/Given 12/11/22 0724)  ondansetron (ZOFRAN) injection 4 mg (4 mg  Intravenous Given 12/11/22 0723)  morphine (PF) 4 MG/ML injection 4 mg (4 mg Intravenous Given 12/11/22 0723)  pantoprazole (PROTONIX) injection 40 mg (40 mg Intravenous Given 12/11/22 0724)  HYDROmorphone (DILAUDID) injection 1 mg (1 mg Intravenous Given 12/11/22 0846)  iohexol (OMNIPAQUE) 300 MG/ML solution 100 mL (75 mLs Intravenous Contrast Given 12/11/22 0943)    ED Course/ Medical Decision Making/ A&P Clinical Course as of 12/11/22 1107  Wed Dec 11, 2022  0905 Hemoglobin(!): 7.8 [CA]  0905 Platelets(!): 756 [CA]  0905 WBC: 9.6 [CA]  0905 Potassium(!): 3.0 [CA]  1027 Reassessed patient at bedside. Patient still admits to pain. Resting in bed. Will discuss with GI. Suspect patient will require admission due to significant pain and hematemesis.  [CA]    Clinical Course User Index [CA] Mannie Stabile, PA-C                                 Medical Decision Making Amount and/or Complexity of Data Reviewed External Data Reviewed: notes.    Details: Admission notes Labs: ordered. Decision-making details documented in ED Course. Radiology: ordered and independent interpretation performed. Decision-making details documented in ED Course. ECG/medicine tests: ordered and independent interpretation performed. Decision-making details documented in ED Course.  Risk Prescription drug management. Decision regarding hospitalization.   This patient presents to the ED for concern of hematemesis/abdominal pain, this involves an extensive number of treatment options, and is a complaint that carries with it a high risk of complications and morbidity.  The differential diagnosis includes UGI bleed, pancreatitis, acute cholecystitis, hyperemesis syndrome, etc  47 year old female with history of polysubstance abuse and previous upper GI bleed presents to the ED due to hematemesis and upper abdominal pain.  Had an EGD on 9/3 which showed 2 non-bleeding ulcers. Not on any blood thinners.  Discharged on  9/5.  Patient admits to 2 episodes of hematemesis over the past 24 hours.  Admits to vomiting blood clots.  Also endorses chest pain and shortness of breath.  Upon arrival, patient afebrile, not tachycardic or hypoxic.  Patient appears uncomfortable in bed.  When I walked in the room patient was curled up in a wheelchair.  Patient able to ambulate to bed without difficulty.  Abdomen soft, nondistended with diffuse tenderness most significant in upper quadrants.  Ventral hernia which is easily reducible. Low suspicion for incarceration or strangulation.  Routine labs ordered.  IV fluids, Zofran, and morphine given.  Cardiac labs ordered given chest pain in the setting of cocaine. IV protonix started for upper GI bleed.  CBC significant for anemia with hemoglobin at 7.8 which appears to be around patient's baseline.  Thrombocytosis at 756.  Pregnancy test negative.  Lipase normal.  Low suspicion for pancreatitis.  CMP with normal LFTs.  Normal renal function.  Hypokalemia at 3.  Potassium repleted.  Troponin normal.  EKG demonstrates normal sinus rhythm.  Low suspicion for atypical ACS.  Chest x-ray personally reviewed and interpreted which negative for signs of pneumonia, pneumothorax, widened mediastinum.  CT abdomen personally reviewed and interpreted which is negative for any acute abnormalities.  Does demonstrate stable right adnexal mass.  Also demonstrates stable uterine fibroids, urethral diverticulum, hepatic steatosis, and umbilical hernia.  No evidence of pancreatitis.  10:59 AM Discussed with Dr. Ewing Schlein with [name] who will see patient.   11:11 AM Discussed with Dr. Idelle Leech with [facility] who agrees to admit patient  Lives at home Hx polysubstance abuse No PCP       Final Clinical Impression(s) / ED Diagnoses Final diagnoses:  Epigastric pain  Hematemesis with nausea    Rx / DC Orders ED Discharge Orders     None         Jesusita Oka 12/11/22 1112    Bethann Berkshire, MD 12/12/22 [phone]

## 2022-12-11 NOTE — ED Notes (Signed)
Pt moving throughout all v/s measurement despite being asked to remain still and being coached several times. Triage RN aware.

## 2022-12-11 NOTE — Consult Note (Addendum)
Reason for Consult: Hematemesis Referring Physician: Hospital team  [name] is an 47 y.o. female.  HPI: Patient seen and examined and her hospital computer chart was reviewed including recent CT scans and multiple endoscopies and case discussed with the ER physician.  She does say she filled her pump inhibitor when she went home but on H&P they determined she was not taking it but has stayed on Aleve for her chronic pain and we discussed no aspirin or nonsteroidals Tylenol only and she says her throwing up blood comes and goes and she is moving her bowels without any obvious blood in them however when I asked about surgery she denied any previous surgery which is different than the computer and I pointed out her scars on her belly as well and she has no other complaints but is very lethargic but easily arousable  Past Medical History:  Diagnosis Date   Anemia 05/2015   due to blood loss from duodenal ulcer.  transfused 3 PRBCs.    Chronic abdominal pain 2008   ? IBS.  multiple CT scans: enlarged uterus.   Depression    with mood swings   Duodenal ulcer 05/2015   Esophagitis 2011   GERD (gastroesophageal reflux disease)    Hypertension    Obesity 2011   Polysubstance abuse (HCC) 09/28/2010   cocaine, opiates, THC, tobacco.       Past Surgical History:  Procedure Laterality Date   BIOPSY  09/08/2022   Procedure: BIOPSY;  Surgeon: Kerin Salen, MD;  Location: [facility];  Service: Gastroenterology;;   BIOPSY  11/12/2022   Procedure: BIOPSY;  Surgeon: Charlott Rakes, MD;  Location: [facility];  Service: Gastroenterology;;   CHOLECYSTECTOMY  2004   ESOPHAGOGASTRODUODENOSCOPY N/A 06/04/2015   Procedure: ESOPHAGOGASTRODUODENOSCOPY (EGD);  Surgeon: Meryl Dare, MD;  Location: [facility];  Service: Endoscopy;  Laterality: N/A;   ESOPHAGOGASTRODUODENOSCOPY N/A 11/12/2022   Procedure: ESOPHAGOGASTRODUODENOSCOPY (EGD);  Surgeon: Charlott Rakes, MD;  Location: [facility];   Service: Gastroenterology;  Laterality: N/A;   ESOPHAGOGASTRODUODENOSCOPY (EGD) WITH PROPOFOL N/A 09/08/2022   Procedure: ESOPHAGOGASTRODUODENOSCOPY (EGD) WITH PROPOFOL;  Surgeon: Kerin Salen, MD;  Location: [facility];  Service: Gastroenterology;  Laterality: N/A;   LAPAROSCOPIC APPENDECTOMY  11/2006   Dr Abbey Chatters   TONSILLECTOMY     TUBAL LIGATION      Family History  Problem Relation Age of Onset   Hypertension Mother    Heart disease Father 25       MI   Stroke Father 2   Diabetes Father    GER disease Sister    Cancer Maternal Grandmother        uterine and stomach    Social History:  reports that she has been smoking cigarettes. She has never used smokeless tobacco. She reports that she does not drink alcohol and does not use drugs.  Allergies:  Allergies  Allergen Reactions   Hydrocodone Nausea And Vomiting    Medications: I have reviewed the patient's current medications.  Results for orders placed or performed during the hospital encounter of 12/11/22 (from the past 48 hour(s))  CBC with Differential     Status: Abnormal   Collection Time: 12/11/22  6:51 AM  Result Value Ref Range   WBC 9.6 4.0 - 10.5 K/uL   RBC 3.18 (L) 3.87 - 5.11 MIL/uL   Hemoglobin 7.8 (L) 12.0 - 15.0 g/dL   HCT 60.7 (L) 37.1 - 06.2 %   MCV 79.2 (L) 80.0 - 100.0 fL  MCH 24.5 (L) 26.0 - 34.0 pg   MCHC 31.0 30.0 - 36.0 g/dL   RDW 16.1 (H) 09.6 - 04.5 %   Platelets 756 (H) 150 - 400 K/uL   nRBC 0.0 0.0 - 0.2 %   Neutrophils Relative % 80 %   Neutro Abs 7.7 1.7 - 7.7 K/uL   Lymphocytes Relative 11 %   Lymphs Abs 1.1 0.7 - 4.0 K/uL   Monocytes Relative 7 %   Monocytes Absolute 0.6 0.1 - 1.0 K/uL   Eosinophils Relative 1 %   Eosinophils Absolute 0.1 0.0 - 0.5 K/uL   Basophils Relative 1 %   Basophils Absolute 0.1 0.0 - 0.1 K/uL   Immature Granulocytes 0 %   Abs Immature Granulocytes 0.04 0.00 - 0.07 K/uL    Comment: Performed at [facility], [address]  [place], [place] [address]  Comprehensive metabolic panel     Status: Abnormal   Collection Time: 12/11/22  6:51 AM  Result Value Ref Range   Sodium 132 (L) 135 - 145 mmol/L   Potassium 3.0 (L) 3.5 - 5.1 mmol/L   Chloride 97 (L) 98 - 111 mmol/L   CO2 28 22 - 32 mmol/L   Glucose, Bld 106 (H) 70 - 99 mg/dL    Comment: Glucose reference range applies only to samples taken after fasting for at least 8 hours.   BUN 16 6 - 20 mg/dL   Creatinine, Ser 1.91 0.44 - 1.00 mg/dL   Calcium 8.5 (L) 8.9 - 10.3 mg/dL   Total Protein 8.2 (H) 6.5 - 8.1 g/dL   Albumin 3.1 (L) 3.5 - 5.0 g/dL   AST 15 15 - 41 U/L   ALT 9 0 - 44 U/L   Alkaline Phosphatase 62 38 - 126 U/L   Total Bilirubin 0.2 (L) 0.3 - 1.2 mg/dL   GFR, Estimated >47 >82 mL/min    Comment: (NOTE) Calculated using the CKD-EPI Creatinine Equation (2021)    Anion gap 7 5 - 15    Comment: Performed at [facility], [address] [place], [place] [address]  Lipase, blood     Status: None   Collection Time: 12/11/22  6:51 AM  Result Value Ref Range   Lipase 34 11 - 51 U/L    Comment: Performed at [facility], [address], [place], [place] [address]  hCG, serum, qualitative     Status: None   Collection Time: 12/11/22  6:51 AM  Result Value Ref Range   Preg, Serum NEGATIVE NEGATIVE    Comment:        THE SENSITIVITY OF THIS METHODOLOGY IS >10 mIU/mL. Performed at [facility], [address] [place], [place] [address]   Troponin I (High Sensitivity)     Status: None   Collection Time: 12/11/22  6:51 AM  Result Value Ref Range   Troponin I (High Sensitivity) 5 <18 ng/L    Comment: (NOTE) Elevated high sensitivity troponin I (hsTnI) values and significant  changes across serial measurements may suggest ACS but many other  chronic and acute conditions are known to elevate hsTnI results.  Refer to the "Links" section for chest pain algorithms and additional  guidance. Performed  at [facility], [address], [place], [place] [address]   Urinalysis, Routine w reflex microscopic -Urine, Clean Catch     Status: Abnormal   Collection Time: 12/11/22  8:53 AM  Result Value Ref Range  Color, Urine YELLOW YELLOW   APPearance CLOUDY (A) CLEAR   Specific Gravity, Urine 1.021 1.005 - 1.030   pH 7.0 5.0 - 8.0   Glucose, UA NEGATIVE NEGATIVE mg/dL   Hgb urine dipstick NEGATIVE NEGATIVE   Bilirubin Urine NEGATIVE NEGATIVE   Ketones, ur NEGATIVE NEGATIVE mg/dL   Protein, ur NEGATIVE NEGATIVE mg/dL   Nitrite NEGATIVE NEGATIVE   Leukocytes,Ua NEGATIVE NEGATIVE    Comment: Performed at [facility], [address], [place], [place] [address]  Troponin I (High Sensitivity)     Status: None   Collection Time: 12/11/22  9:01 AM  Result Value Ref Range   Troponin I (High Sensitivity) 7 <18 ng/L    Comment: (NOTE) Elevated high sensitivity troponin I (hsTnI) values and significant  changes across serial measurements may suggest ACS but many other  chronic and acute conditions are known to elevate hsTnI results.  Refer to the "Links" section for chest pain algorithms and additional  guidance. Performed at [facility], [address] [place], [place] [address]     CT ABDOMEN PELVIS W CONTRAST  Result Date: 12/11/2022 CLINICAL DATA:  Abdominal pain. Acute pancreatitis. Right adnexal mass. EXAM: CT ABDOMEN AND PELVIS WITH CONTRAST TECHNIQUE: Multidetector CT imaging of the abdomen and pelvis was performed using the standard protocol following bolus administration of intravenous contrast. RADIATION DOSE REDUCTION: This exam was performed according to the departmental dose-optimization program which includes automated exposure control, adjustment of the mA and/or kV according to patient size and/or use of iterative reconstruction technique. CONTRAST:  75mL OMNIPAQUE IOHEXOL 300 MG/ML  SOLN COMPARISON:  11/11/2022 FINDINGS:  Lower Chest: No acute findings. Hepatobiliary: Mild-to-moderate diffuse hepatic steatosis again noted. No suspicious hepatic masses identified. Prior cholecystectomy. No evidence of biliary obstruction. Pancreas: No pancreatic mass or peripancreatic inflammatory changes identified. Mild diffuse pancreatic ductal dilatation remains unchanged compared to prior study. No peripancreatic fluid collections identified. Spleen: Within normal limits in size and appearance. Adrenals/Urinary Tract: No suspicious masses identified. No evidence of ureteral calculi or hydronephrosis. Unremarkable unopacified urinary bladder. A horseshoe shaped cystic area is again seen surrounding the urethra, measuring 3.1 x 1.6 cm. This shows no significant change in his most consistent with a urethral diverticulum. Stomach/Bowel: No evidence of obstruction, inflammatory process or abnormal fluid collections. Vascular/Lymphatic: No pathologically enlarged lymph nodes. No acute vascular findings. Reproductive: Few small fibroids are seen in the posterior lower uterine segment. A right adnexal mass is again seen which measures 4.9 x 3.7 cm and abuts the right lateral uterine wall. This remains stable in size, and likely represents pedunculated fibroid, although ovarian mass cannot definitely be excluded on this exam. No No evidence of inflammatory changes or ascites. Other:  Stable small umbilical hernia, which contains only fat. Musculoskeletal:  No suspicious bone lesions identified. IMPRESSION: No radiographic signs of acute pancreatitis or other acute findings. Stable 4.9 cm right adnexal mass, which likely represents pedunculated fibroid, although ovarian mass cannot definitely be excluded on this exam. Recommend further evaluation with pelvic MRI without and with contrast. Stable small uterine fibroids. Stable large urethral diverticulum. Stable hepatic steatosis. Stable small umbilical hernia, which contains only fat. Electronically Signed    By: Danae Orleans M.D.   On: 12/11/2022 10:39   DG Chest Portable 1 View  Result Date: 12/11/2022 CLINICAL DATA:  Shortness of breath, vomiting, concern for GI bleed EXAM: PORTABLE CHEST 1 VIEW COMPARISON:  None Available. FINDINGS: The heart size and mediastinal contours are within normal  limits. Both lungs are clear. The visualized skeletal structures are unremarkable. IMPRESSION: No active disease. Electronically Signed   By: Judie Petit.  Shick M.D.   On: 12/11/2022 08:29    Review of Systems negative except above Blood pressure (!) 170/61, pulse 70, temperature 97.9 F (36.6 C), temperature source Oral, resp. rate (!) 24, last menstrual period 11/06/2022, SpO2 100%. 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 if nausea vomiting or signs of bleeding will consider repeat endoscopy at some point but she needs drug rehab and education regarding aspirin and nonsteroidals and I will check on tomorrow but call me sooner as needed signs of acute bleeding  Blaise Grieshaber E 12/11/2022, 12:11 PM

## 2022-12-11 NOTE — ED Notes (Signed)
Pt is now asleep in wheelchair and quiet

## 2022-12-12 DIAGNOSIS — K922 Gastrointestinal hemorrhage, unspecified: Secondary | ICD-10-CM | POA: Diagnosis not present

## 2022-12-12 LAB — CBC
HCT: 25.5 % — ABNORMAL LOW (ref 36.0–46.0)
Hemoglobin: 7.7 g/dL — ABNORMAL LOW (ref 12.0–15.0)
MCH: 24.7 pg — ABNORMAL LOW (ref 26.0–34.0)
MCHC: 30.2 g/dL (ref 30.0–36.0)
MCV: 81.7 fL (ref 80.0–100.0)
Platelets: 748 10*3/uL — ABNORMAL HIGH (ref 150–400)
RBC: 3.12 MIL/uL — ABNORMAL LOW (ref 3.87–5.11)
RDW: 15.9 % — ABNORMAL HIGH (ref 11.5–15.5)
WBC: 7.6 10*3/uL (ref 4.0–10.5)
nRBC: 0 % (ref 0.0–0.2)

## 2022-12-12 LAB — COMPREHENSIVE METABOLIC PANEL
ALT: 10 U/L (ref 0–44)
AST: 13 U/L — ABNORMAL LOW (ref 15–41)
Albumin: 2.6 g/dL — ABNORMAL LOW (ref 3.5–5.0)
Alkaline Phosphatase: 53 U/L (ref 38–126)
Anion gap: 9 (ref 5–15)
BUN: 7 mg/dL (ref 6–20)
CO2: 24 mmol/L (ref 22–32)
Calcium: 8.5 mg/dL — ABNORMAL LOW (ref 8.9–10.3)
Chloride: 103 mmol/L (ref 98–111)
Creatinine, Ser: 0.45 mg/dL (ref 0.44–1.00)
GFR, Estimated: >60 mL/min (ref >60)
Glucose, Bld: 102 mg/dL — ABNORMAL HIGH (ref 70–99)
Potassium: 3.2 mmol/L — ABNORMAL LOW (ref 3.5–5.1)
Sodium: 136 mmol/L (ref 135–145)
Total Bilirubin: 0.1 mg/dL — ABNORMAL LOW (ref 0.3–1.2)
Total Protein: 7.2 g/dL (ref 6.5–8.1)

## 2022-12-12 LAB — PHOSPHORUS: Phosphorus: 3.4 mg/dL (ref 2.5–4.6)

## 2022-12-12 LAB — IRON AND TIBC
Iron: 20 ug/dL — ABNORMAL LOW (ref 28–170)
Saturation Ratios: 5 % — ABNORMAL LOW (ref 10.4–31.8)
TIBC: 405 ug/dL (ref 250–450)
UIBC: 385 ug/dL

## 2022-12-12 LAB — MAGNESIUM: Magnesium: 1.7 mg/dL (ref 1.7–2.4)

## 2022-12-12 LAB — FERRITIN: Ferritin: 3 ng/mL — ABNORMAL LOW (ref 11–307)

## 2022-12-12 MED ORDER — POTASSIUM CHLORIDE IN NACL 40-0.9 MEQ/L-% IV SOLN
INTRAVENOUS | Status: DC
  Filled 2022-12-12 (×2): qty 1000

## 2022-12-12 MED ORDER — POTASSIUM CHLORIDE 10 MEQ/100ML IV SOLN
10.0000 meq | INTRAVENOUS | Status: AC
  Administered 2022-12-12 (×4): 10 meq via INTRAVENOUS
  Filled 2022-12-12 (×4): qty 100

## 2022-12-12 MED ORDER — FAMOTIDINE IN NACL 20-0.9 MG/50ML-% IV SOLN
20.0000 mg | Freq: Once | INTRAVENOUS | Status: AC
  Administered 2022-12-12: 20 mg via INTRAVENOUS
  Filled 2022-12-12: qty 50

## 2022-12-12 MED ORDER — SODIUM CHLORIDE 0.9 % IV SOLN
100.0000 mg | Freq: Once | INTRAVENOUS | Status: AC
  Administered 2022-12-12: 100 mg via INTRAVENOUS
  Filled 2022-12-12: qty 5

## 2022-12-12 MED ORDER — SODIUM CHLORIDE 0.9 % IV SOLN
100.0000 mg | Freq: Once | INTRAVENOUS | Status: DC
  Filled 2022-12-12: qty 5

## 2022-12-12 MED ORDER — ACETAMINOPHEN 500 MG PO TABS
1000.0000 mg | ORAL_TABLET | Freq: Once | ORAL | Status: AC
  Administered 2022-12-12: 1000 mg via ORAL
  Filled 2022-12-12: qty 2

## 2022-12-12 NOTE — Plan of Care (Signed)
  Problem: Activity: Goal: Risk for activity intolerance will decrease Outcome: Progressing   Problem: Nutrition: Goal: Adequate nutrition will be maintained Outcome: Progressing   Problem: Elimination: Goal: Will not experience complications related to bowel motility Outcome: Progressing   

## 2022-12-12 NOTE — Plan of Care (Signed)

## 2022-12-12 NOTE — Progress Notes (Signed)
[name] 12:16 PM  Subjective: Patient doing better than yesterday and not having abdominal pain and wants to eat and no signs of bleeding and I was surprised but she remembered our conversation about aspirin and nonsteroidals yesterday and we also talked about her need to quit drug use and buy her prescription of her stomach medicine  Objective: Vital signs stable afebrile no acute distress abdomen is soft nontender BUN and creatinine decreased she is iron deficient and her hemoglobin is stable  Assessment: Peptic ulcer disease secondary to aspirin and nonsteroidals Plan: Twice a day pump inhibitors for 1 month then can decrease to once a day and if she did not take her H. pylori therapy last admission would benefit from that going forward and will try soft diet per her request and she can follow-up with my partner Dr. Bosie Clos 1 month to consider setting up a repeat endoscopy in a few months to document healing and resolution of H. pylori  [name]  office [phone] After 5PM or if no answer call [phone]

## 2022-12-12 NOTE — TOC Initial Note (Addendum)
Transition of Care [facility]) - Initial/Assessment Note    Patient Details  Name: [name] MRN: [id number] Date of Birth: 06-Oct-1975  Transition of [facility]) CM/SW Contact:    Lanier Clam, RN Phone Number: 12/12/2022, 9:35 AM  Clinical Narrative:Spoke to patient about d/c plans, & referrals-Has a safe home-lives w/her mother(address on face sheet),she is not homeless,has PCP,transportation(medicaid benefit-she is aware to call ahead) & she may call family /friend @ d/c for transport home. Declines any resources for substance use-states she can manage on own. Monitor for d/c plans.                Expected Discharge Plan: Home/Self Care Barriers to Discharge: Continued Medical Work up   Patient Goals and CMS Choice Patient states their goals for this hospitalization and ongoing recovery are:: Home CMS Medicare.gov Compare Post Acute Care list provided to:: Patient Choice offered to / list presented to : Patient [place] ownership interest in [facility].provided to:: Patient    Expected Discharge Plan and Services   Discharge Planning Services: CM Consult Post Acute Care Choice: Resumption of Svcs/PTA Provider Living arrangements for the past 2 months: Apartment                                      Prior Living Arrangements/Services Living arrangements for the past 2 months: Apartment Lives with:: Parents Patient language and need for interpreter reviewed:: Yes Do you feel safe going back to the place where you live?: Yes      Need for Family Participation in Patient Care: Yes (Comment) Care giver support system in place?: Yes (comment)   Criminal Activity/Legal Involvement Pertinent to Current Situation/Hospitalization: No - Comment as needed  Activities of Daily Living   ADL Screening (condition at time of admission) Is the patient deaf or have difficulty hearing?: No Does the patient have difficulty seeing, even when wearing glasses/contacts?:  No Does the patient have difficulty concentrating, remembering, or making decisions?: No  Permission Sought/Granted Permission sought to share information with : Case Manager Permission granted to share information with : Yes, Verbal Permission Granted  Share Information with NAME: Case manager           Emotional Assessment Appearance:: Appears stated age Attitude/Demeanor/Rapport: Gracious Affect (typically observed): Accepting Orientation: : Oriented to Self, Oriented to Place, Oriented to  Time, Oriented to Situation Alcohol / Substance Use: Illicit Drugs Psych Involvement: No (comment)  Admission diagnosis:  Epigastric pain [R10.13] Acute upper GI bleeding [K92.2] Hematemesis with nausea [K92.0] Patient Active Problem List   Diagnosis Date Noted   Acute upper GI bleeding 12/11/2022   Hematemesis 09/06/2022   Nausea and vomiting 09/06/2022   Duodenal ulcer    Acute blood loss anemia    GI bleed 06/03/2015   Symptomatic anemia 06/03/2015   Polysubstance abuse (HCC) 06/03/2015   Thrombocytosis 06/03/2015   Chronic abdominal pain 06/03/2015   Cocaine abuse (HCC) 06/03/2015   Upper GI bleed    Obesity 10/24/2010   GERD (gastroesophageal reflux disease) 09/28/2010   Anxiety 09/28/2010   Hypertension 09/28/2010   Tobacco abuse 09/28/2010   PCP:  Patient, No Pcp Per Pharmacy:   [id number] [place], [place] - [address] AT [address] [place] [address] Phone: [phone] Fax: [phone]  [facility] - [place], [place] - [address]  [place] [address] Phone: [phone] Fax: [phone]  [facility] [address] [place] [address] Phone: [phone] Fax: [phone]     Social Determinants of Health (SDOH) Social History: SDOH Screenings   Food Insecurity: No Food Insecurity (12/12/2022)  Recent Concern: Food  Insecurity - Food Insecurity Present (12/11/2022)  Housing: Low Risk  (12/12/2022)  Recent Concern: Housing - High Risk (12/11/2022)  Transportation Needs: No Transportation Needs (12/12/2022)  Recent Concern: Transportation Needs - Unmet Transportation Needs (12/11/2022)  Utilities: Not At Risk (12/12/2022)  Recent Concern: Utilities - At Risk (12/11/2022)  Tobacco Use: High Risk (12/11/2022)   SDOH Interventions: Food Insecurity Interventions: Intervention Not Indicated, Inpatient TOC Housing Interventions: Intervention Not Indicated, Inpatient TOC Transportation Interventions: Intervention Not Indicated, Inpatient TOC Utilities Interventions: Intervention Not Indicated, Inpatient TOC   Readmission Risk Interventions     No data to display

## 2022-12-12 NOTE — Progress Notes (Signed)
PROGRESS NOTE    [name]  ZOX:[id number] DOB: [birth year] DOA: 12/11/2022 PCP: Patient, No Pcp Per   Brief Narrative:  47 y.o. female with PMH significant for polysubstance abuse, major depression, esophagitis, cocaine abuse, chronic anemia presented to the ED with complaints of throwing up blood twice and chronic abdominal pain.  Patient was recently admitted in [facility] from 11/11/2022 to 11/14/2022 for upper GI bleeding and abdominal pain.  Hemoglobin was stable.  Patient underwent EGD found to have grade A esophagitis, 2 nonbleeding cratered gastric ulcers , acute gastritis.  She was treated with IV pantoprazole and subsequently discharged on oral PPI.  Patient reports she has not been taking pantoprazole , She is homeless and she continued to use cocaine,  THC.  Last cocaine use was 2 days ago.  Patient states she started throwing up blood yesterday and has worsening of abdominal pain so she presented in the ED.  She also reports some intermittent chest pain associated with shortness of breath with exertion.   CT abdomen and pelvis ;No radiographic signs of acute pancreatitis or other acute findings.  Stable right adnexal mass.   Assessment & Plan:   Principal Problem:   Acute upper GI bleeding Active Problems:   GERD (gastroesophageal reflux disease)   Anxiety   Hypertension   Tobacco abuse   Obesity   Polysubstance abuse (HCC)   Cocaine abuse (HCC)   Acute upper GI bleed: Patient admitted with hematemesis in the setting of NSAID use.  Hemoglobin 7.7 which is around her baseline but with very low iron.  Will give dose of IV iron.  Continue PPI.  EGD recent hospitalization nonbleeding gastric ulcers noted.  Patient followed by GI.  Patient was noncompliant to pantoprazole at home.  Urine drug screen last admission positive for cocaine THC and opiates.  GI recommends Protonix twice a day for 1 month and then once a day.  Will discuss with her regarding H. pylori treatment whether  she completed or not. Advance diet per GI   Chronic abdominal pain: She reports chronic intermittent abdominal pain.   CT abdomen and pelvis negative for acute abnormality. Likely in the setting of ongoing substance abuse. Continue Dilaudid as needed for pain control   Hypertension: Blood pressure stable Patient is not on any oral antihypertensive medication Start hydralazine 10 mg IV every 6 as needed for SBP above 170   Tobacco abuse: Counseled on quit smoking. Nicotine patch.   Polysubstance abuse: Patient reports she is under a lot of stress.  She is homeless.   Counseled to refrain from using substance abuse. TOC consult.   Cocaine abuse: Counseled to refrain from using cocaine.   Hypokalemia:  Replaced.  Continue to monitor   Estimated body mass index is 19.83 kg/m as calculated from the following:   Height as of 11/12/22: 5\' 1"  (1.549 m).   Weight as of 11/12/22: 47.6 kg.  DVT prophylaxis: None due to GI bleed Code Status: Full code  family Communication: None  disposition Plan:  Status is: Inpatient Remains inpatient appropriate because: GI bleed   Consultants:  GI  Procedures: None Antimicrobials: none  Subjective: Complains of epigastric pain feeling hungry anxious to eat  Objective: Vitals:   12/11/22 1757 12/11/22 1937 12/11/22 2057 12/12/22 0559  BP: (!) 176/98 (!) 192/97 (!) 168/94 117/89  Pulse: 70 77 81 83  Resp: 14 18 18 16   Temp:  99.1 F (37.3 C) 97.9 F (36.6 C) 98.2 F (36.8 C)  TempSrc:  Oral Oral Oral  SpO2: 100% 100% 100% 100%    Intake/Output Summary (Last 24 hours) at 12/12/2022 1506 Last data filed at 12/12/2022 1136 Gross per 24 hour  Intake 935 ml  Output --  Net 935 ml   There were no vitals filed for this visit.  Examination:  General exam: Appears in no acute distress Respiratory system: Clear to auscultation. Respiratory effort normal. Cardiovascular system: S1 & S2 heard, RRR. No JVD, murmurs, rubs, gallops or  clicks. No pedal edema. Gastrointestinal system: Abdomen is nondistended, soft and epigastric tender. No organomegaly or masses felt. Normal bowel sounds heard. Central nervous system: Alert and oriented. No focal neurological deficits. Extremities: No edema  Data Reviewed: I have personally reviewed following labs and imaging studies  CBC: Recent Labs  Lab 12/11/22 0651 12/12/22 0510  WBC 9.6 7.6  NEUTROABS 7.7  --   HGB 7.8* 7.7*  HCT 25.2* 25.5*  MCV 79.2* 81.7  PLT 756* 748*   Basic Metabolic Panel: Recent Labs  Lab 12/11/22 0651 12/12/22 0510  NA 132* 136  K 3.0* 3.2*  CL 97* 103  CO2 28 24  GLUCOSE 106* 102*  BUN 16 7  CREATININE 0.57 0.45  CALCIUM 8.5* 8.5*  MG  --  1.7  PHOS  --  3.4   GFR: CrCl cannot be calculated (Unknown ideal weight.). Liver Function Tests: Recent Labs  Lab 12/11/22 0651 12/12/22 0510  AST 15 13*  ALT 9 10  ALKPHOS 62 53  BILITOT 0.2* 0.1*  PROT 8.2* 7.2  ALBUMIN 3.1* 2.6*   Recent Labs  Lab 12/11/22 0651  LIPASE 34   No results for input(s): "AMMONIA" in the last 168 hours. Coagulation Profile: No results for input(s): "INR", "PROTIME" in the last 168 hours. Cardiac Enzymes: No results for input(s): "CKTOTAL", "CKMB", "CKMBINDEX", "TROPONINI" in the last 168 hours. BNP (last 3 results) No results for input(s): "PROBNP" in the last 8760 hours. HbA1C: No results for input(s): "HGBA1C" in the last 72 hours. CBG: No results for input(s): "GLUCAP" in the last 168 hours. Lipid Profile: No results for input(s): "CHOL", "HDL", "LDLCALC", "TRIG", "CHOLHDL", "LDLDIRECT" in the last 72 hours. Thyroid Function Tests: No results for input(s): "TSH", "T4TOTAL", "FREET4", "T3FREE", "THYROIDAB" in the last 72 hours. Anemia Panel: Recent Labs    12/12/22 0510  FERRITIN 3*  TIBC 405  IRON 20*   Sepsis Labs: No results for input(s): "PROCALCITON", "LATICACIDVEN" in the last 168 hours.  No results found for this or any  previous visit (from the past 240 hour(s)).       Radiology Studies: CT ABDOMEN PELVIS W CONTRAST  Result Date: 12/11/2022 CLINICAL DATA:  Abdominal pain. Acute pancreatitis. Right adnexal mass. EXAM: CT ABDOMEN AND PELVIS WITH CONTRAST TECHNIQUE: Multidetector CT imaging of the abdomen and pelvis was performed using the standard protocol following bolus administration of intravenous contrast. RADIATION DOSE REDUCTION: This exam was performed according to the departmental dose-optimization program which includes automated exposure control, adjustment of the mA and/or kV according to patient size and/or use of iterative reconstruction technique. CONTRAST:  75mL OMNIPAQUE IOHEXOL 300 MG/ML  SOLN COMPARISON:  11/11/2022 FINDINGS: Lower Chest: No acute findings. Hepatobiliary: Mild-to-moderate diffuse hepatic steatosis again noted. No suspicious hepatic masses identified. Prior cholecystectomy. No evidence of biliary obstruction. Pancreas: No pancreatic mass or peripancreatic inflammatory changes identified. Mild diffuse pancreatic ductal dilatation remains unchanged compared to prior study. No peripancreatic fluid collections identified. Spleen: Within normal limits in size and appearance. Adrenals/Urinary Tract: No  suspicious masses identified. No evidence of ureteral calculi or hydronephrosis. Unremarkable unopacified urinary bladder. A horseshoe shaped cystic area is again seen surrounding the urethra, measuring 3.1 x 1.6 cm. This shows no significant change in his most consistent with a urethral diverticulum. Stomach/Bowel: No evidence of obstruction, inflammatory process or abnormal fluid collections. Vascular/Lymphatic: No pathologically enlarged lymph nodes. No acute vascular findings. Reproductive: Few small fibroids are seen in the posterior lower uterine segment. A right adnexal mass is again seen which measures 4.9 x 3.7 cm and abuts the right lateral uterine wall. This remains stable in size, and  likely represents pedunculated fibroid, although ovarian mass cannot definitely be excluded on this exam. No No evidence of inflammatory changes or ascites. Other:  Stable small umbilical hernia, which contains only fat. Musculoskeletal:  No suspicious bone lesions identified. IMPRESSION: No radiographic signs of acute pancreatitis or other acute findings. Stable 4.9 cm right adnexal mass, which likely represents pedunculated fibroid, although ovarian mass cannot definitely be excluded on this exam. Recommend further evaluation with pelvic MRI without and with contrast. Stable small uterine fibroids. Stable large urethral diverticulum. Stable hepatic steatosis. Stable small umbilical hernia, which contains only fat. Electronically Signed   By: Danae Orleans M.D.   On: 12/11/2022 10:39   DG Chest Portable 1 View  Result Date: 12/11/2022 CLINICAL DATA:  Shortness of breath, vomiting, concern for GI bleed EXAM: PORTABLE CHEST 1 VIEW COMPARISON:  None Available. FINDINGS: The heart size and mediastinal contours are within normal limits. Both lungs are clear. The visualized skeletal structures are unremarkable. IMPRESSION: No active disease. Electronically Signed   By: Judie Petit.  Shick M.D.   On: 12/11/2022 08:29        Scheduled Meds:  docusate sodium  100 mg Oral BID   nicotine  14 mg Transdermal Daily   pantoprazole (PROTONIX) IV  40 mg Intravenous Q12H   Continuous Infusions:  sodium chloride 75 mL/hr at 12/12/22 1139     LOS: 1 day    Time spent:  38 min Alwyn Ren, MD  12/12/2022, 3:06 PM

## 2022-12-13 ENCOUNTER — Other Ambulatory Visit (HOSPITAL_COMMUNITY): Payer: Self-pay

## 2022-12-13 DIAGNOSIS — K922 Gastrointestinal hemorrhage, unspecified: Secondary | ICD-10-CM | POA: Diagnosis not present

## 2022-12-13 MED ORDER — NICOTINE 14 MG/24HR TD PT24: 14.0000 mg | MEDICATED_PATCH | Freq: Every day | TRANSDERMAL | 0 refills | Status: DC

## 2022-12-13 MED ORDER — BISMUTH SUBSALICYLATE 262 MG PO CHEW
300.0000 mg | CHEWABLE_TABLET | Freq: Four times a day (QID) | ORAL | 0 refills | Status: AC
  Filled 2022-12-13: qty 60, 15d supply, fill #0

## 2022-12-13 MED ORDER — PANTOPRAZOLE SODIUM 40 MG PO TBEC
40.0000 mg | DELAYED_RELEASE_TABLET | Freq: Two times a day (BID) | ORAL | 0 refills | Status: DC
  Filled 2022-12-13: qty 60, 30d supply, fill #0

## 2022-12-13 MED ORDER — TETRACYCLINE HCL 500 MG PO CAPS
500.0000 mg | ORAL_CAPSULE | Freq: Four times a day (QID) | ORAL | 0 refills | Status: DC
  Filled 2022-12-13: qty 44, 11d supply, fill #0
  Filled 2022-12-13: qty 12, 3d supply, fill #0

## 2022-12-13 MED ORDER — PANTOPRAZOLE SODIUM 40 MG PO TBEC: 40.0000 mg | DELAYED_RELEASE_TABLET | Freq: Two times a day (BID) | ORAL | 0 refills | Status: DC

## 2022-12-13 MED ORDER — METRONIDAZOLE 500 MG PO TABS
500.0000 mg | ORAL_TABLET | Freq: Three times a day (TID) | ORAL | 0 refills | Status: AC
  Filled 2022-12-13: qty 42, 14d supply, fill #0

## 2022-12-13 NOTE — Discharge Summary (Signed)
Physician Discharge Summary  [name]:[id number] DOB: Dec 15, 1975 DOA: 12/11/2022  PCP: Patient, No Pcp Per  Admit date: 12/11/2022 Discharge date: 12/13/2022  Admitted From: Home Disposition: Home  Recommendations for Outpatient Follow-up:  Follow up with PCP in 1-2 weeks Please obtain BMP/CBC in one week Please follow up with Dr. Bosie Clos  Home Health: None Equipment/Devices: None  Discharge Condition: Stable CODE STATUS: Full Diet recommendation: Cardiac  Brief/Interim Summary:47 y.o. female with PMH significant for polysubstance abuse, major depression, esophagitis, cocaine abuse, chronic anemia presented to the ED with complaints of throwing up blood twice and chronic abdominal pain.  Patient was recently admitted in [facility] from 11/11/2022 to 11/14/2022 for upper GI bleeding and abdominal pain.  Hemoglobin was stable.  Patient underwent EGD found to have grade A esophagitis, 2 nonbleeding cratered gastric ulcers , acute gastritis.  She was treated with IV pantoprazole and subsequently discharged on oral PPI.  Patient reports she has not been taking pantoprazole , She is homeless and she continued to use cocaine,  THC.  Last cocaine use was 2 days ago.  Patient states she started throwing up blood yesterday and has worsening of abdominal pain so she presented in the ED.  She also reports some intermittent chest pain associated with shortness of breath with exertion.   CT abdomen and pelvis ;No radiographic signs of acute pancreatitis or other acute findings.  Stable right adnexal mass.    Discharge Diagnoses:  Principal Problem:   Acute upper GI bleeding Active Problems:   GERD (gastroesophageal reflux disease)   Anxiety   Hypertension   Tobacco abuse   Obesity   Polysubstance abuse (HCC)   Cocaine abuse (HCC)    Acute upper GI bleed: Patient was admitted with hematemesis in the setting of NSAID use.  Hemoglobin 7.7 which is around her baseline but with very low  iron.  She received a dose of IV  iron infusion.  She was treated with PPI.  She was seen by GI.  Patient did not follow-up with GI after discharge from the hospital a month ago.  Biopsy at that time after the EGD was positive for H. pylori.  She will be discharged home on Protonix, bismuth, tetracycline and Flagyl.  Protonix 40 mg twice a day for a month and then 40 mg daily.  H. pylori treatment for 14 days.  This was sent to [facility] and patient was going to walk there and pick up the prescription. EGD recent hospitalization nonbleeding gastric ulcers noted.     Tobacco abuse: Counseled on quit smoking. Nicotine patch was prescribed.   Polysubstance abuse: Patient reports she is under a lot of stress.  She is homeless.   Counseled to refrain from using substance abuse.   Cocaine abuse: Counseled to refrain from using cocaine.   Hypokalemia:  Replaced.   Estimated body mass index is 19.83 kg/m as calculated from the following:   Height as of 11/12/22: 5\' 1"  (1.549 m).   Weight as of 11/12/22: 47.6 kg.  Discharge Instructions  Discharge Instructions     Diet - low sodium heart healthy   Complete by: As directed    Increase activity slowly   Complete by: As directed       Allergies as of 12/13/2022       Reactions   Hydrocodone Nausea And Vomiting        Medication List     TAKE these medications    bismuth subsalicylate 262 MG  chewable tablet Commonly known as: PEPTO BISMOL Chew 1 tablet (262 mg total) by mouth 4 (four) times daily.   metroNIDAZOLE 500 MG tablet Commonly known as: Flagyl Take 1 tablet (500 mg total) by mouth 3 (three) times daily for 14 days   nicotine 14 mg/24hr patch Commonly known as: NICODERM CQ - dosed in mg/24 hours Place 1 patch (14 mg total) onto the skin daily. Start taking on: December 14, 2022   pantoprazole 40 MG tablet Commonly known as: Protonix Take 1 tablet (40 mg total) by mouth 2 (two) times daily before a meal.    tetracycline 500 MG capsule Commonly known as: SUMYCIN Take 1 capsule (500 mg total) by mouth 4 (four) times daily.        Allergies  Allergen Reactions   Hydrocodone Nausea And Vomiting    Consultations:gi   Procedures/Studies: CT ABDOMEN PELVIS W CONTRAST  Result Date: 12/11/2022 CLINICAL DATA:  Abdominal pain. Acute pancreatitis. Right adnexal mass. EXAM: CT ABDOMEN AND PELVIS WITH CONTRAST TECHNIQUE: Multidetector CT imaging of the abdomen and pelvis was performed using the standard protocol following bolus administration of intravenous contrast. RADIATION DOSE REDUCTION: This exam was performed according to the departmental dose-optimization program which includes automated exposure control, adjustment of the mA and/or kV according to patient size and/or use of iterative reconstruction technique. CONTRAST:  75mL OMNIPAQUE IOHEXOL 300 MG/ML  SOLN COMPARISON:  11/11/2022 FINDINGS: Lower Chest: No acute findings. Hepatobiliary: Mild-to-moderate diffuse hepatic steatosis again noted. No suspicious hepatic masses identified. Prior cholecystectomy. No evidence of biliary obstruction. Pancreas: No pancreatic mass or peripancreatic inflammatory changes identified. Mild diffuse pancreatic ductal dilatation remains unchanged compared to prior study. No peripancreatic fluid collections identified. Spleen: Within normal limits in size and appearance. Adrenals/Urinary Tract: No suspicious masses identified. No evidence of ureteral calculi or hydronephrosis. Unremarkable unopacified urinary bladder. A horseshoe shaped cystic area is again seen surrounding the urethra, measuring 3.1 x 1.6 cm. This shows no significant change in his most consistent with a urethral diverticulum. Stomach/Bowel: No evidence of obstruction, inflammatory process or abnormal fluid collections. Vascular/Lymphatic: No pathologically enlarged lymph nodes. No acute vascular findings. Reproductive: Few small fibroids are seen in the  posterior lower uterine segment. A right adnexal mass is again seen which measures 4.9 x 3.7 cm and abuts the right lateral uterine wall. This remains stable in size, and likely represents pedunculated fibroid, although ovarian mass cannot definitely be excluded on this exam. No No evidence of inflammatory changes or ascites. Other:  Stable small umbilical hernia, which contains only fat. Musculoskeletal:  No suspicious bone lesions identified. IMPRESSION: No radiographic signs of acute pancreatitis or other acute findings. Stable 4.9 cm right adnexal mass, which likely represents pedunculated fibroid, although ovarian mass cannot definitely be excluded on this exam. Recommend further evaluation with pelvic MRI without and with contrast. Stable small uterine fibroids. Stable large urethral diverticulum. Stable hepatic steatosis. Stable small umbilical hernia, which contains only fat. Electronically Signed   By: Danae Orleans M.D.   On: 12/11/2022 10:39   DG Chest Portable 1 View  Result Date: 12/11/2022 CLINICAL DATA:  Shortness of breath, vomiting, concern for GI bleed EXAM: PORTABLE CHEST 1 VIEW COMPARISON:  None Available. FINDINGS: The heart size and mediastinal contours are within normal limits. Both lungs are clear. The visualized skeletal structures are unremarkable. IMPRESSION: No active disease. Electronically Signed   By: Judie Petit.  Shick M.D.   On: 12/11/2022 08:29   (Echo, Carotid, EGD, Colonoscopy, ERCP)    Subjective:  Tolerated breakfast and lunch anxious to go home  Discharge Exam: Vitals:   12/12/22 1958 12/13/22 0425  BP: 128/84 (!) 154/96  Pulse: 90 93  Resp: 16 16  Temp: 99.6 F (37.6 C) 98.7 F (37.1 C)  SpO2: 100% 100%   Vitals:   12/11/22 2057 12/12/22 0559 12/12/22 1958 12/13/22 0425  BP: (!) 168/94 117/89 128/84 (!) 154/96  Pulse: 81 83 90 93  Resp: 18 16 16 16   Temp: 97.9 F (36.6 C) 98.2 F (36.8 C) 99.6 F (37.6 C) 98.7 F (37.1 C)  TempSrc: Oral Oral Oral Oral   SpO2: 100% 100% 100% 100%    General: Pt is alert, awake, not in acute distress Cardiovascular: RRR, S1/S2 +, no rubs, no gallops Respiratory: CTA bilaterally, no wheezing, no rhonchi Abdominal: Soft, NT, ND, bowel sounds + Extremities: no edema, no cyanosis    The results of significant diagnostics from this hospitalization (including imaging, microbiology, ancillary and laboratory) are listed below for reference.     Microbiology: No results found for this or any previous visit (from the past 240 hour(s)).   Labs: BNP (last 3 results) No results for input(s): "BNP" in the last 8760 hours. Basic Metabolic Panel: Recent Labs  Lab 12/11/22 0651 12/12/22 0510  NA 132* 136  K 3.0* 3.2*  CL 97* 103  CO2 28 24  GLUCOSE 106* 102*  BUN 16 7  CREATININE 0.57 0.45  CALCIUM 8.5* 8.5*  MG  --  1.7  PHOS  --  3.4   Liver Function Tests: Recent Labs  Lab 12/11/22 0651 12/12/22 0510  AST 15 13*  ALT 9 10  ALKPHOS 62 53  BILITOT 0.2* 0.1*  PROT 8.2* 7.2  ALBUMIN 3.1* 2.6*   Recent Labs  Lab 12/11/22 0651  LIPASE 34   No results for input(s): "AMMONIA" in the last 168 hours. CBC: Recent Labs  Lab 12/11/22 0651 12/12/22 0510  WBC 9.6 7.6  NEUTROABS 7.7  --   HGB 7.8* 7.7*  HCT 25.2* 25.5*  MCV 79.2* 81.7  PLT 756* 748*   Cardiac Enzymes: No results for input(s): "CKTOTAL", "CKMB", "CKMBINDEX", "TROPONINI" in the last 168 hours. BNP: Invalid input(s): "POCBNP" CBG: No results for input(s): "GLUCAP" in the last 168 hours. D-Dimer No results for input(s): "DDIMER" in the last 72 hours. Hgb A1c No results for input(s): "HGBA1C" in the last 72 hours. Lipid Profile No results for input(s): "CHOL", "HDL", "LDLCALC", "TRIG", "CHOLHDL", "LDLDIRECT" in the last 72 hours. Thyroid function studies No results for input(s): "TSH", "T4TOTAL", "T3FREE", "THYROIDAB" in the last 72 hours.  Invalid input(s): "FREET3" Anemia work up Recent Labs    12/12/22 0510   FERRITIN 3*  TIBC 405  IRON 20*   Urinalysis    Component Value Date/Time   COLORURINE YELLOW 12/11/2022 0853   APPEARANCEUR CLOUDY (A) 12/11/2022 0853   LABSPEC 1.021 12/11/2022 0853   PHURINE 7.0 12/11/2022 0853   GLUCOSEU NEGATIVE 12/11/2022 0853   HGBUR NEGATIVE 12/11/2022 0853   BILIRUBINUR NEGATIVE 12/11/2022 0853   KETONESUR NEGATIVE 12/11/2022 0853   PROTEINUR NEGATIVE 12/11/2022 0853   UROBILINOGEN 0.2 12/21/2014 2007   NITRITE NEGATIVE 12/11/2022 0853   LEUKOCYTESUR NEGATIVE 12/11/2022 0853   Sepsis Labs Recent Labs  Lab 12/11/22 0651 12/12/22 0510  WBC 9.6 7.6   Microbiology No results found for this or any previous visit (from the past 240 hour(s)).   Time coordinating discharge: 38 minutes  SIGNED:   Alwyn Ren, MD  [facility] 12/13/2022, 4:04 PM

## 2022-12-13 NOTE — Progress Notes (Signed)
AVS given to patient and explained at the bedside. Medications and follow up appointments have been explained with pt verbalizing understanding.  

## 2022-12-13 NOTE — TOC Transition Note (Addendum)
Transition of Care [facility]) - CM/SW Discharge Note   Patient Details  Name: [name] MRN: [id number] Date of Birth: 10-Nov-1975  Transition of Care [facility] CM/SW Contact:  Adrian Prows, RN Phone Number: 12/13/2022, 3:08 PM   Clinical Narrative:    D/C orders received; No TOC needs.  -[id number]- notified pt needs bus pass; pass given to Isabel, Charity fundraiser; no TOC needs.   Final next level of care: Home/Self Care Barriers to Discharge: No Barriers Identified   Patient Goals and CMS Choice CMS Medicare.gov Compare Post Acute Care list provided to:: Patient Choice offered to / list presented to : Patient  Discharge Placement                         Discharge Plan and Services Additional resources added to the After Visit Summary for     Discharge Planning Services: CM Consult Post Acute Care Choice: Resumption of Svcs/PTA Provider                               Social Determinants of Health (SDOH) Interventions SDOH Screenings   Food Insecurity: No Food Insecurity (12/12/2022)  Recent Concern: Food Insecurity - Food Insecurity Present (12/11/2022)  Housing: Low Risk  (12/12/2022)  Recent Concern: Housing - High Risk (12/11/2022)  Transportation Needs: No Transportation Needs (12/12/2022)  Recent Concern: Transportation Needs - Unmet Transportation Needs (12/11/2022)  Utilities: Not At Risk (12/12/2022)  Recent Concern: Utilities - At Risk (12/11/2022)  Tobacco Use: High Risk (12/11/2022)     Readmission Risk Interventions     No data to display

## 2023-07-06 ENCOUNTER — Encounter (HOSPITAL_COMMUNITY): Payer: Self-pay

## 2023-07-06 ENCOUNTER — Other Ambulatory Visit: Payer: Self-pay

## 2023-07-06 ENCOUNTER — Observation Stay (HOSPITAL_COMMUNITY)
Admission: EM | Admit: 2023-07-06 | Discharge: 2023-07-08 | Disposition: A | Discharge status: Home / Self Care | Attending: Surgery | Admitting: Surgery

## 2023-07-06 ENCOUNTER — Emergency Department (HOSPITAL_COMMUNITY)

## 2023-07-06 DIAGNOSIS — F17210 Nicotine dependence, cigarettes, uncomplicated: Secondary | ICD-10-CM | POA: Insufficient documentation

## 2023-07-06 DIAGNOSIS — S0292XA Unspecified fracture of facial bones, initial encounter for closed fracture: Secondary | ICD-10-CM | POA: Diagnosis not present

## 2023-07-06 DIAGNOSIS — R220 Localized swelling, mass and lump, head: Secondary | ICD-10-CM | POA: Diagnosis present

## 2023-07-06 DIAGNOSIS — I10 Essential (primary) hypertension: Secondary | ICD-10-CM | POA: Insufficient documentation

## 2023-07-06 DIAGNOSIS — S060X0A Concussion without loss of consciousness, initial encounter: Secondary | ICD-10-CM | POA: Diagnosis not present

## 2023-07-06 DIAGNOSIS — S02401A Maxillary fracture, unspecified, initial encounter for closed fracture: Secondary | ICD-10-CM

## 2023-07-06 LAB — COMPREHENSIVE METABOLIC PANEL WITH GFR
ALT: 19 U/L (ref 0–44)
AST: 32 U/L (ref 15–41)
Albumin: 3.3 g/dL — ABNORMAL LOW (ref 3.5–5.0)
Alkaline Phosphatase: 67 U/L (ref 38–126)
Anion gap: 9 (ref 5–15)
BUN: 10 mg/dL (ref 6–20)
CO2: 25 mmol/L (ref 22–32)
Calcium: 8.9 mg/dL (ref 8.9–10.3)
Chloride: 99 mmol/L (ref 98–111)
Creatinine, Ser: 0.59 mg/dL (ref 0.44–1.00)
GFR, Estimated: >60 mL/min (ref >60)
Glucose, Bld: 94 mg/dL (ref 70–99)
Potassium: 3.9 mmol/L (ref 3.5–5.1)
Sodium: 133 mmol/L — ABNORMAL LOW (ref 135–145)
Total Bilirubin: 0.8 mg/dL (ref 0.0–1.2)
Total Protein: 7.4 g/dL (ref 6.5–8.1)

## 2023-07-06 LAB — CBC
HCT: 33.5 % — ABNORMAL LOW (ref 36.0–46.0)
Hemoglobin: 10.4 g/dL — ABNORMAL LOW (ref 12.0–15.0)
MCH: 24.4 pg — ABNORMAL LOW (ref 26.0–34.0)
MCHC: 31 g/dL (ref 30.0–36.0)
MCV: 78.5 fL — ABNORMAL LOW (ref 80.0–100.0)
Platelets: 528 10*3/uL — ABNORMAL HIGH (ref 150–400)
RBC: 4.27 MIL/uL (ref 3.87–5.11)
RDW: 19.2 % — ABNORMAL HIGH (ref 11.5–15.5)
WBC: 14.2 10*3/uL — ABNORMAL HIGH (ref 4.0–10.5)
nRBC: 0 % (ref 0.0–0.2)

## 2023-07-06 MED ORDER — ONDANSETRON HCL 4 MG/2ML IJ SOLN: 4.0000 mg | Freq: Four times a day (QID) | INTRAMUSCULAR | Status: DC | PRN

## 2023-07-06 MED ORDER — FENTANYL CITRATE PF 50 MCG/ML IJ SOSY
50.0000 ug | PREFILLED_SYRINGE | Freq: Once | INTRAMUSCULAR | Status: AC
  Administered 2023-07-06: 50 ug via INTRAVENOUS
  Filled 2023-07-06: qty 1

## 2023-07-06 MED ORDER — HYDRALAZINE HCL 20 MG/ML IJ SOLN
10.0000 mg | INTRAMUSCULAR | Status: DC | PRN
  Filled 2023-07-06: qty 1

## 2023-07-06 MED ORDER — OXYCODONE HCL 5 MG PO TABS
10.0000 mg | ORAL_TABLET | ORAL | Status: DC | PRN
  Administered 2023-07-06 – 2023-07-07 (×2): 10 mg via ORAL
  Filled 2023-07-06 (×2): qty 2

## 2023-07-06 MED ORDER — DOCUSATE SODIUM 100 MG PO CAPS
100.0000 mg | ORAL_CAPSULE | Freq: Two times a day (BID) | ORAL | Status: DC
  Administered 2023-07-06 – 2023-07-08 (×4): 100 mg via ORAL
  Filled 2023-07-06 (×4): qty 1

## 2023-07-06 MED ORDER — ACETAMINOPHEN 500 MG PO TABS
1000.0000 mg | ORAL_TABLET | Freq: Four times a day (QID) | ORAL | Status: DC
  Administered 2023-07-06 – 2023-07-08 (×6): 1000 mg via ORAL
  Filled 2023-07-06 (×7): qty 2

## 2023-07-06 MED ORDER — ENOXAPARIN SODIUM 30 MG/0.3ML IJ SOSY
30.0000 mg | PREFILLED_SYRINGE | Freq: Two times a day (BID) | INTRAMUSCULAR | Status: DC
  Administered 2023-07-07 – 2023-07-08 (×3): 30 mg via SUBCUTANEOUS
  Filled 2023-07-06 (×3): qty 0.3

## 2023-07-06 MED ORDER — LACTATED RINGERS IV SOLN: INTRAVENOUS | Status: DC

## 2023-07-06 MED ORDER — POLYETHYLENE GLYCOL 3350 17 G PO PACK: 17.0000 g | PACK | Freq: Every day | ORAL | Status: DC | PRN

## 2023-07-06 MED ORDER — KETOROLAC TROMETHAMINE 30 MG/ML IJ SOLN
15.0000 mg | Freq: Once | INTRAMUSCULAR | Status: AC
  Administered 2023-07-06: 15 mg via INTRAVENOUS
  Filled 2023-07-06: qty 1

## 2023-07-06 MED ORDER — OXYCODONE HCL 5 MG PO TABS: 5.0000 mg | ORAL_TABLET | ORAL | Status: DC | PRN

## 2023-07-06 MED ORDER — METHOCARBAMOL 500 MG PO TABS
500.0000 mg | ORAL_TABLET | Freq: Three times a day (TID) | ORAL | Status: DC
  Administered 2023-07-06 – 2023-07-08 (×6): 500 mg via ORAL
  Filled 2023-07-06 (×6): qty 1

## 2023-07-06 MED ORDER — ONDANSETRON 4 MG PO TBDP: 4.0000 mg | ORAL_TABLET | Freq: Four times a day (QID) | ORAL | Status: DC | PRN

## 2023-07-06 MED ORDER — METHOCARBAMOL 1000 MG/10ML IJ SOLN: 500.0000 mg | Freq: Three times a day (TID) | INTRAMUSCULAR | Status: DC
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
## 2023-07-06 NOTE — Progress Notes (Signed)
 Patient refused skin assessment.  Oriented to room and hospital policies.  Call light in reach.

## 2023-07-06 NOTE — TOC CAGE-AID Note (Signed)
 Transition of Care [facility]) - CAGE-AID Screening   Patient Details  Name: [name] MRN: [id number] Date of Birth: [birth year]   Asa Bjork, RN Trauma Response Nurse Phone Number: [phone] 07/06/2023, 3:05 PM     CAGE-AID Screening:    Have You Ever Felt You Ought to Cut Down on Your Drinking or Drug Use?: No Have People Annoyed You By Critizing Your Drinking Or Drug Use?: No Have You Felt Bad Or Guilty About Your Drinking Or Drug Use?: No Have You Ever Had a Drink or Used Drugs First Thing In The Morning to Steady Your Nerves or to Get Rid of a Hangover?: No CAGE-AID Score: 0  Substance Abuse Education Offered: No (pt denies alcohol use, states occas uses cocaine and marijuana. declines any services)

## 2023-07-07 LAB — CBC
HCT: 31.5 % — ABNORMAL LOW (ref 36.0–46.0)
Hemoglobin: 10 g/dL — ABNORMAL LOW (ref 12.0–15.0)
MCH: 25 pg — ABNORMAL LOW (ref 26.0–34.0)
MCHC: 31.7 g/dL (ref 30.0–36.0)
MCV: 78.8 fL — ABNORMAL LOW (ref 80.0–100.0)
Platelets: 523 10*3/uL — ABNORMAL HIGH (ref 150–400)
RBC: 4 MIL/uL (ref 3.87–5.11)
RDW: 19.2 % — ABNORMAL HIGH (ref 11.5–15.5)
WBC: 8.7 10*3/uL (ref 4.0–10.5)
nRBC: 0 % (ref 0.0–0.2)

## 2023-07-07 LAB — BASIC METABOLIC PANEL WITH GFR
Anion gap: 9 (ref 5–15)
BUN: 5 mg/dL — ABNORMAL LOW (ref 6–20)
CO2: 23 mmol/L (ref 22–32)
Calcium: 8.1 mg/dL — ABNORMAL LOW (ref 8.9–10.3)
Chloride: 102 mmol/L (ref 98–111)
Creatinine, Ser: 0.55 mg/dL (ref 0.44–1.00)
GFR, Estimated: >60 mL/min (ref >60)
Glucose, Bld: 87 mg/dL (ref 70–99)
Potassium: 3.7 mmol/L (ref 3.5–5.1)
Sodium: 134 mmol/L — ABNORMAL LOW (ref 135–145)
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
## 2023-07-07 NOTE — Plan of Care (Signed)
  Problem: Education: Goal: Knowledge of General Education information will improve Description: Including pain rating scale, medication(s)/side effects and non-pharmacologic comfort measures Outcome: Progressing   Problem: Clinical Measurements: Goal: Will remain free from infection Outcome: Progressing Goal: Diagnostic test results will improve Outcome: Progressing   Problem: Pain Managment: Goal: General experience of comfort will improve and/or be controlled Outcome: Progressing

## 2023-07-07 NOTE — Progress Notes (Signed)
 Patient ID: [name], female   DOB: 1975/11/03, 48 y.o.   MRN: [id number]  She has swelling in the left face. Vision intact. Opens eye. No diplopia. No septal hematoma.   She should follow up in 1 week
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
## 2023-07-08 ENCOUNTER — Other Ambulatory Visit (HOSPITAL_COMMUNITY): Payer: Self-pay

## 2023-07-08 MED ORDER — POLYETHYLENE GLYCOL 3350 17 G PO PACK: 17.0000 g | PACK | Freq: Every day | ORAL | Status: AC | PRN

## 2023-07-08 MED ORDER — OXYCODONE HCL 5 MG PO TABS
5.0000 mg | ORAL_TABLET | ORAL | 0 refills | Status: AC | PRN
  Filled 2023-07-08: qty 30, 5d supply, fill #0

## 2023-07-08 MED ORDER — METHOCARBAMOL 500 MG PO TABS
500.0000 mg | ORAL_TABLET | Freq: Three times a day (TID) | ORAL | 0 refills | Status: AC
  Filled 2023-07-08: qty 30, 10d supply, fill #0

## 2023-07-08 MED ORDER — ACETAMINOPHEN 500 MG PO TABS: 1000.0000 mg | ORAL_TABLET | Freq: Four times a day (QID) | ORAL | Status: AC | PRN

## 2023-07-08 NOTE — Discharge Planning (Signed)
 Patient alert. IV access was removed by the patient when I went in the room. Discharge teaching given to patient. Patient verbalized understanding of teaching including medications. Discharge summary placed in discharge packet and put with patient belongings.  TOC medications were picked up and left with patient belongings. Patient will be discharged to the discharge lounge and they will provide transportation to the hotel where she is staying.
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
## 2023-07-08 NOTE — Progress Notes (Signed)
 CSW met with pt regarding SDOH: food, housing.  Pt reports she does receive food stamps but says they are not enough to get through the month.  She would be interested in food pantry options.  List provided. Housing: pt staying in local hotel. Plans to continue this for now. Baldo Bonds, MSW, LCSW 4/29/20259:58 AM

## 2024-01-30 ENCOUNTER — Emergency Department (HOSPITAL_COMMUNITY)
Admission: EM | Admit: 2024-01-30 | Discharge: 2024-01-31 | Disposition: A | Discharge status: Home / Self Care | Attending: Emergency Medicine | Admitting: Emergency Medicine

## 2024-01-30 ENCOUNTER — Emergency Department (HOSPITAL_COMMUNITY)

## 2024-01-30 ENCOUNTER — Other Ambulatory Visit: Payer: Self-pay

## 2024-01-30 ENCOUNTER — Encounter (HOSPITAL_COMMUNITY): Payer: Self-pay

## 2024-01-30 DIAGNOSIS — R0602 Shortness of breath: Secondary | ICD-10-CM | POA: Diagnosis present

## 2024-01-30 DIAGNOSIS — E876 Hypokalemia: Secondary | ICD-10-CM | POA: Insufficient documentation

## 2024-01-30 DIAGNOSIS — I10 Essential (primary) hypertension: Secondary | ICD-10-CM | POA: Diagnosis not present

## 2024-01-30 DIAGNOSIS — R072 Precordial pain: Secondary | ICD-10-CM | POA: Diagnosis not present

## 2024-01-30 LAB — BASIC METABOLIC PANEL WITH GFR
Anion gap: 13 (ref 5–15)
BUN: 13 mg/dL (ref 6–20)
CO2: 21 mmol/L — ABNORMAL LOW (ref 22–32)
Calcium: 9 mg/dL (ref 8.9–10.3)
Chloride: 100 mmol/L (ref 98–111)
Creatinine, Ser: 0.64 mg/dL (ref 0.44–1.00)
GFR, Estimated: >60 mL/min (ref >60)
Glucose, Bld: 151 mg/dL — ABNORMAL HIGH (ref 70–99)
Potassium: 3.2 mmol/L — ABNORMAL LOW (ref 3.5–5.1)
Sodium: 134 mmol/L — ABNORMAL LOW (ref 135–145)

## 2024-01-30 LAB — CBC WITH DIFFERENTIAL/PLATELET
Abs Immature Granulocytes: 0.03 K/uL (ref 0.00–0.07)
Basophils Absolute: 0.1 K/uL (ref 0.0–0.1)
Basophils Relative: 1 %
Eosinophils Absolute: 0.1 K/uL (ref 0.0–0.5)
Eosinophils Relative: 1 %
HCT: 34 % — ABNORMAL LOW (ref 36.0–46.0)
Hemoglobin: 10.8 g/dL — ABNORMAL LOW (ref 12.0–15.0)
Immature Granulocytes: 0 %
Lymphocytes Relative: 36 %
Lymphs Abs: 3.3 K/uL (ref 0.7–4.0)
MCH: 27.3 pg (ref 26.0–34.0)
MCHC: 31.8 g/dL (ref 30.0–36.0)
MCV: 85.9 fL (ref 80.0–100.0)
Monocytes Absolute: 0.6 K/uL (ref 0.1–1.0)
Monocytes Relative: 7 %
Neutro Abs: 5 K/uL (ref 1.7–7.7)
Neutrophils Relative %: 55 %
Platelets: 467 K/uL — ABNORMAL HIGH (ref 150–400)
RBC: 3.96 MIL/uL (ref 3.87–5.11)
RDW: 18.3 % — ABNORMAL HIGH (ref 11.5–15.5)
WBC: 9.1 K/uL (ref 4.0–10.5)
nRBC: 0 % (ref 0.0–0.2)

## 2024-01-30 LAB — TROPONIN I (HIGH SENSITIVITY)
Troponin I (High Sensitivity): 6 ng/L (ref <18)
Troponin I (High Sensitivity): 7 ng/L (ref <18)

## 2024-01-30 MED ORDER — DOXYCYCLINE HYCLATE 100 MG PO CAPS: 100.0000 mg | ORAL_CAPSULE | Freq: Two times a day (BID) | ORAL | 0 refills | Status: DC

## 2024-01-30 MED ORDER — POTASSIUM CHLORIDE CRYS ER 20 MEQ PO TBCR
40.0000 meq | EXTENDED_RELEASE_TABLET | Freq: Once | ORAL | Status: AC
  Administered 2024-01-30: 40 meq via ORAL
  Filled 2024-01-30: qty 2

## 2024-01-30 MED ORDER — DOXYCYCLINE HYCLATE 100 MG PO TABS
100.0000 mg | ORAL_TABLET | Freq: Once | ORAL | Status: AC
  Administered 2024-01-30: 100 mg via ORAL
  Filled 2024-01-30: qty 1

## 2024-01-30 NOTE — ED Provider Notes (Signed)
 [place] EMERGENCY DEPARTMENT AT [facility] Provider Note   CSN: [id number] Arrival date & time: 01/30/24  2010     Patient presents with: Shortness of Breath   [name] is a 48 y.o. female.  {Add pertinent medical, surgical, social history, OB history to HPI:32947}  Shortness of Breath    Patient has history of polysubstance abuse, chronic abdominal pain, hypertension, acid reflux.  Patient states she was using cocaine and marijuana about 6 PM this evening.  Patient states she has started experiencing shortness of breath and chest pain following that.  Patient initially felt like she was overdosing and she was having trouble breathing.  She felt very anxious.  Her symptoms are improving.  Prior to Admission medications   Medication Sig Start Date End Date Taking? Authorizing Provider  acetaminophen  (TYLENOL ) 500 MG tablet Take 2 tablets (1,000 mg total) by mouth every 6 (six) hours as needed for mild pain (pain score 1-3) or headache. 07/08/23   Vicci Burnard SAUNDERS, PA-C  bismuth  subsalicylate (PEPTO BISMOL) 262 MG chewable tablet Chew 1 tablet (262 mg total) by mouth 4 (four) times daily. Patient taking differently: Chew 300 mg by mouth as needed for indigestion or diarrhea or loose stools. 12/13/22   Will Almarie MATSU, MD  methocarbamol  (ROBAXIN ) 500 MG tablet Take 1 tablet (500 mg total) by mouth every 8 (eight) hours. 07/08/23   Vicci Burnard SAUNDERS, PA-C  oxyCODONE  (OXY IR/ROXICODONE ) 5 MG immediate release tablet Take 1 tablet (5 mg total) by mouth every 4 (four) hours as needed for moderate pain (pain score 4-6). 07/08/23   Vicci Burnard SAUNDERS, PA-C  polyethylene glycol (MIRALAX  / GLYCOLAX ) 17 g packet Take 17 g by mouth daily as needed for mild constipation (constipation). 07/08/23   Vicci Burnard SAUNDERS, PA-C  PRESCRIPTION MEDICATION Take 1 tablet by mouth daily. Blood pressure medication medication name, dosage, and appearance unknown    [provider]     Allergies: Hydrocodone    Review of Systems  Respiratory:  Positive for shortness of breath.     Updated Vital Signs BP (!) 154/97 (BP Location: Left Arm)   Pulse 95   Temp 98.7 F (37.1 C)   Resp 18   Ht 1.549 m (5' 1)   SpO2 100%   BMI 19.83 kg/m   Physical Exam Vitals and nursing note reviewed.  Constitutional:      Appearance: She is well-developed. She is not diaphoretic.  HENT:     Head: Normocephalic and atraumatic.     Right Ear: External ear normal.     Left Ear: External ear normal.  Eyes:     General: No scleral icterus.       Right eye: No discharge.        Left eye: No discharge.     Conjunctiva/sclera: Conjunctivae normal.  Neck:     Trachea: No tracheal deviation.  Cardiovascular:     Rate and Rhythm: Normal rate and regular rhythm.  Pulmonary:     Effort: Pulmonary effort is normal. No respiratory distress.     Breath sounds: Normal breath sounds. No stridor. No wheezing or rales.  Abdominal:     General: Bowel sounds are normal. There is no distension.     Palpations: Abdomen is soft.     Tenderness: There is no abdominal tenderness. There is no guarding or rebound.  Musculoskeletal:        General: No tenderness or deformity.     Cervical back:  Neck supple.     Right lower leg: No edema.     Left lower leg: No edema.  Skin:    General: Skin is warm and dry.     Findings: No rash.  Neurological:     General: No focal deficit present.     Mental Status: She is alert.     Cranial Nerves: No cranial nerve deficit, dysarthria or facial asymmetry.     Sensory: No sensory deficit.     Motor: No abnormal muscle tone or seizure activity.     Coordination: Coordination normal.  Psychiatric:        Mood and Affect: Mood normal.     (all labs ordered are listed, but only abnormal results are displayed) Labs Reviewed  BASIC METABOLIC PANEL WITH GFR - Abnormal; Notable for the following components:      Result Value   Sodium 134 (*)     Potassium 3.2 (*)    CO2 21 (*)    Glucose, Bld 151 (*)    All other components within normal limits  CBC WITH DIFFERENTIAL/PLATELET - Abnormal; Notable for the following components:   Hemoglobin 10.8 (*)    HCT 34.0 (*)    RDW 18.3 (*)    Platelets 467 (*)    All other components within normal limits  RAPID URINE DRUG SCREEN, HOSP PERFORMED  TROPONIN I (HIGH SENSITIVITY)  TROPONIN I (HIGH SENSITIVITY)    EKG: None  Radiology: DG Chest 2 View Result Date: 01/30/2024 EXAM: 2 VIEW(S) XRAY OF THE CHEST 01/30/2024 08:47:26 PM COMPARISON: 12/11/2022 CLINICAL HISTORY: CP/SHOB FINDINGS: LUNGS AND PLEURA: 5 basilar airspace opacities which could reflect atelectasis or pneumonia. No pleural effusion. No pneumothorax. HEART AND MEDIASTINUM: No acute abnormality of the cardiac and mediastinal silhouettes. BONES AND SOFT TISSUES: No acute osseous abnormality. IMPRESSION: 1. Basilar airspace opacities concerning for atelectasis versus pneumonia. Electronically signed by: Franky Crease MD 01/30/2024 08:54 PM EST RP Workstation: [id number]    {Document cardiac monitor, telemetry assessment procedure when appropriate:32947} Procedures   Medications Ordered in the ED - No data to display  Clinical Course as of 02/01/24 1308  Fri Jan 30, 2024  2226 CBC normal.  Metabolic panel shows hypokalemia.  Potassium replacement ordered.  Initial troponin normal [JK]  2226 DG Chest 2 View Chest x-ray suggest possible atelectasis versus pneumonia [JK]    Clinical Course User Index [JK] Randol Simmonds, MD   {Click here for ABCD2, HEART and other calculators REFRESH Note before signing:1}                              Medical Decision Making Amount and/or Complexity of Data Reviewed Radiology:  Decision-making details documented in ED Course.  Risk Prescription drug management.   ***  {Document critical care time when appropriate  Document review of labs and clinical decision tools ie CHADS2VASC2, etc   Document your independent review of radiology images and any outside records  Document your discussion with family members, caretakers and with consultants  Document social determinants of health affecting pt's care  Document your decision making why or why not admission, treatments were needed:32947:::1}   Final diagnoses:  None    ED Discharge Orders     None

## 2024-01-30 NOTE — ED Provider Triage Note (Signed)
 Emergency Medicine Provider Triage Evaluation Note  [name] , a 48 y.o. female  was evaluated in triage.  Pt complains of smoked some crack and marijuana earlier this evening, now experiencing shortness of breath and chest pain.  Review of Systems  Positive: Chest pain, shortness of breath, drug use Negative: Fever, chills, nausea, vomiting  Physical Exam  BP (!) 154/97 (BP Location: Left Arm)   Pulse 95   Temp 98.7 F (37.1 C)   Resp 18   SpO2 100%  Gen:   Awake, no distress, patient anxious and crying Resp:  Normal effort, CTAB MSK:   Moves extremities without difficulty  Other:    Medical Decision Making  Medically screening exam initiated at 8:18 PM.  Appropriate orders placed.  [name] was informed that the remainder of the evaluation will be completed by another provider, this initial triage assessment does not replace that evaluation, and the importance of remaining in the ED until their evaluation is complete.  Labs and imaging ordered   Gail Nygaard N, PA-C 01/30/24 2020
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
## 2024-01-31 LAB — RAPID URINE DRUG SCREEN, HOSP PERFORMED
Amphetamines: NOT DETECTED
Barbiturates: NOT DETECTED
Benzodiazepines: NOT DETECTED
Cocaine: POSITIVE — AB
Opiates: NOT DETECTED
Tetrahydrocannabinol: POSITIVE — AB

## 2024-01-31 MED ORDER — DOXYCYCLINE HYCLATE 100 MG PO CAPS
100.0000 mg | ORAL_CAPSULE | Freq: Two times a day (BID) | ORAL | 0 refills | Status: AC
  Filled 2024-01-31: qty 14, 7d supply, fill #0

## 2024-01-31 MED ORDER — DOXYCYCLINE HYCLATE 100 MG PO CAPS
100.0000 mg | ORAL_CAPSULE | Freq: Two times a day (BID) | ORAL | 0 refills | Status: DC
  Filled 2024-01-31: qty 14, 7d supply, fill #0

## 2024-01-31 NOTE — ED Provider Notes (Signed)
 Blood pressure 124/87, pulse 75, temperature 98.7 F (37.1 C), resp. rate 16, height 5' 1 (1.549 m), SpO2 100%.  Assuming care from Dr. Randol.  In short, [name] is a 48 y.o. female with a chief complaint of Shortness of Breath .  Refer to the original H&P for additional details.  The current plan of care is to follow up on patient after second troponin.  12:09 AM  Second troponin negative. Patient stable for discharge.    Darra Fonda MATSU, MD 01/31/24 [phone]
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
## 2024-02-02 ENCOUNTER — Other Ambulatory Visit (HOSPITAL_COMMUNITY): Payer: Self-pay

## 2024-02-12 ENCOUNTER — Other Ambulatory Visit (HOSPITAL_COMMUNITY): Payer: Self-pay
